# Patient Record
Sex: Male | Born: 1940
Health system: Southern US, Community
[De-identification: ages and names within clinical notes are randomized; demographics above are authoritative.]

## PROBLEM LIST (undated history)

## (undated) DIAGNOSIS — M899 Disorder of bone, unspecified: Secondary | ICD-10-CM

## (undated) DIAGNOSIS — I2699 Other pulmonary embolism without acute cor pulmonale: Secondary | ICD-10-CM

## (undated) DIAGNOSIS — I499 Cardiac arrhythmia, unspecified: Secondary | ICD-10-CM

## (undated) DIAGNOSIS — I4949 Other premature depolarization: Secondary | ICD-10-CM

## (undated) DIAGNOSIS — K769 Liver disease, unspecified: Secondary | ICD-10-CM

## (undated) DIAGNOSIS — N400 Enlarged prostate without lower urinary tract symptoms: Secondary | ICD-10-CM

## (undated) DIAGNOSIS — K635 Polyp of colon: Secondary | ICD-10-CM

## (undated) DIAGNOSIS — I10 Essential (primary) hypertension: Secondary | ICD-10-CM

## (undated) DIAGNOSIS — R911 Solitary pulmonary nodule: Secondary | ICD-10-CM

## (undated) DIAGNOSIS — J449 Chronic obstructive pulmonary disease, unspecified: Secondary | ICD-10-CM

## (undated) DIAGNOSIS — K219 Gastro-esophageal reflux disease without esophagitis: Secondary | ICD-10-CM

## (undated) DIAGNOSIS — J9610 Chronic respiratory failure, unspecified whether with hypoxia or hypercapnia: Secondary | ICD-10-CM

## (undated) DIAGNOSIS — E785 Hyperlipidemia, unspecified: Secondary | ICD-10-CM

## (undated) DIAGNOSIS — E7800 Pure hypercholesterolemia, unspecified: Secondary | ICD-10-CM

## (undated) DIAGNOSIS — D6859 Other primary thrombophilia: Secondary | ICD-10-CM

## (undated) DIAGNOSIS — G4700 Insomnia, unspecified: Secondary | ICD-10-CM

## (undated) DIAGNOSIS — Z7901 Long term (current) use of anticoagulants: Secondary | ICD-10-CM

## (undated) HISTORY — DX: Liver disease, unspecified: K76.9

## (undated) HISTORY — DX: Long term (current) use of anticoagulants: Z79.01

## (undated) HISTORY — DX: Other premature depolarization: I49.49

## (undated) HISTORY — DX: Solitary pulmonary nodule: R91.1

## (undated) HISTORY — DX: Other primary thrombophilia: D68.59

## (undated) HISTORY — DX: Chronic respiratory failure, unspecified whether with hypoxia or hypercapnia: J96.10

## (undated) HISTORY — DX: Hyperlipidemia, unspecified: E78.5

## (undated) HISTORY — DX: Insomnia, unspecified: G47.00

## (undated) HISTORY — DX: Gastro-esophageal reflux disease without esophagitis: K21.9

## (undated) HISTORY — PX: NO PAST SURGERIES: SHX2092

## (undated) HISTORY — DX: Benign prostatic hyperplasia without lower urinary tract symptoms: N40.0

## (undated) HISTORY — DX: Cardiac arrhythmia, unspecified: I49.9

## (undated) HISTORY — DX: Polyp of colon: K63.5

## (undated) HISTORY — DX: Pure hypercholesterolemia, unspecified: E78.00

## (undated) HISTORY — DX: Disorder of bone, unspecified: M89.9

---

## 2015-09-22 DIAGNOSIS — I10 Essential (primary) hypertension: Secondary | ICD-10-CM | POA: Diagnosis not present

## 2015-09-22 DIAGNOSIS — G4700 Insomnia, unspecified: Secondary | ICD-10-CM | POA: Diagnosis not present

## 2015-09-22 DIAGNOSIS — J439 Emphysema, unspecified: Secondary | ICD-10-CM | POA: Diagnosis not present

## 2015-09-22 DIAGNOSIS — E785 Hyperlipidemia, unspecified: Secondary | ICD-10-CM | POA: Diagnosis not present

## 2015-12-25 DIAGNOSIS — R911 Solitary pulmonary nodule: Secondary | ICD-10-CM | POA: Diagnosis not present

## 2015-12-25 DIAGNOSIS — Z0000 Encounter for general adult medical examination without abnormal findings: Secondary | ICD-10-CM | POA: Diagnosis not present

## 2015-12-25 DIAGNOSIS — E785 Hyperlipidemia, unspecified: Secondary | ICD-10-CM | POA: Diagnosis not present

## 2015-12-25 DIAGNOSIS — G4700 Insomnia, unspecified: Secondary | ICD-10-CM | POA: Diagnosis not present

## 2015-12-25 DIAGNOSIS — J439 Emphysema, unspecified: Secondary | ICD-10-CM | POA: Diagnosis not present

## 2015-12-25 DIAGNOSIS — I10 Essential (primary) hypertension: Secondary | ICD-10-CM | POA: Diagnosis not present

## 2015-12-25 DIAGNOSIS — Z79899 Other long term (current) drug therapy: Secondary | ICD-10-CM | POA: Diagnosis not present

## 2015-12-27 ENCOUNTER — Other Ambulatory Visit: Payer: Self-pay | Admitting: Family Medicine

## 2015-12-27 DIAGNOSIS — R911 Solitary pulmonary nodule: Secondary | ICD-10-CM

## 2016-01-02 ENCOUNTER — Ambulatory Visit
Admission: RE | Admit: 2016-01-02 | Discharge: 2016-01-02 | Disposition: A | Discharge status: Home / Self Care | Payer: Commercial Managed Care - HMO | Source: Ambulatory Visit | Attending: Family Medicine | Admitting: Family Medicine

## 2016-01-02 DIAGNOSIS — R911 Solitary pulmonary nodule: Secondary | ICD-10-CM

## 2016-01-02 DIAGNOSIS — R918 Other nonspecific abnormal finding of lung field: Secondary | ICD-10-CM | POA: Diagnosis not present

## 2016-06-24 DIAGNOSIS — Z79899 Other long term (current) drug therapy: Secondary | ICD-10-CM | POA: Diagnosis not present

## 2016-06-24 DIAGNOSIS — M899 Disorder of bone, unspecified: Secondary | ICD-10-CM | POA: Diagnosis not present

## 2016-06-24 DIAGNOSIS — J439 Emphysema, unspecified: Secondary | ICD-10-CM | POA: Diagnosis not present

## 2016-06-24 DIAGNOSIS — I10 Essential (primary) hypertension: Secondary | ICD-10-CM | POA: Diagnosis not present

## 2016-06-24 DIAGNOSIS — G4700 Insomnia, unspecified: Secondary | ICD-10-CM | POA: Diagnosis not present

## 2016-06-24 DIAGNOSIS — E7800 Pure hypercholesterolemia, unspecified: Secondary | ICD-10-CM | POA: Diagnosis not present

## 2016-07-03 DIAGNOSIS — I10 Essential (primary) hypertension: Secondary | ICD-10-CM | POA: Diagnosis not present

## 2016-07-03 DIAGNOSIS — Z6832 Body mass index (BMI) 32.0-32.9, adult: Secondary | ICD-10-CM | POA: Diagnosis not present

## 2016-07-03 DIAGNOSIS — M899 Disorder of bone, unspecified: Secondary | ICD-10-CM | POA: Diagnosis not present

## 2016-07-23 DIAGNOSIS — Z23 Encounter for immunization: Secondary | ICD-10-CM | POA: Diagnosis not present

## 2018-05-15 DIAGNOSIS — E7800 Pure hypercholesterolemia, unspecified: Secondary | ICD-10-CM | POA: Diagnosis not present

## 2018-05-15 DIAGNOSIS — I10 Essential (primary) hypertension: Secondary | ICD-10-CM | POA: Diagnosis not present

## 2018-05-15 DIAGNOSIS — J439 Emphysema, unspecified: Secondary | ICD-10-CM | POA: Diagnosis not present

## 2018-05-15 DIAGNOSIS — G4700 Insomnia, unspecified: Secondary | ICD-10-CM | POA: Diagnosis not present

## 2018-05-15 DIAGNOSIS — I2699 Other pulmonary embolism without acute cor pulmonale: Secondary | ICD-10-CM | POA: Diagnosis not present

## 2018-06-12 DIAGNOSIS — I10 Essential (primary) hypertension: Secondary | ICD-10-CM | POA: Diagnosis not present

## 2018-06-12 DIAGNOSIS — Z86711 Personal history of pulmonary embolism: Secondary | ICD-10-CM | POA: Diagnosis not present

## 2018-06-12 DIAGNOSIS — G4700 Insomnia, unspecified: Secondary | ICD-10-CM | POA: Diagnosis not present

## 2018-06-12 DIAGNOSIS — J439 Emphysema, unspecified: Secondary | ICD-10-CM | POA: Diagnosis not present

## 2018-07-10 DIAGNOSIS — Z23 Encounter for immunization: Secondary | ICD-10-CM | POA: Diagnosis not present

## 2018-12-15 DIAGNOSIS — R911 Solitary pulmonary nodule: Secondary | ICD-10-CM | POA: Diagnosis not present

## 2018-12-15 DIAGNOSIS — Z0001 Encounter for general adult medical examination with abnormal findings: Secondary | ICD-10-CM | POA: Diagnosis not present

## 2018-12-15 DIAGNOSIS — M899 Disorder of bone, unspecified: Secondary | ICD-10-CM | POA: Diagnosis not present

## 2018-12-15 DIAGNOSIS — I10 Essential (primary) hypertension: Secondary | ICD-10-CM | POA: Diagnosis not present

## 2018-12-15 DIAGNOSIS — Z79899 Other long term (current) drug therapy: Secondary | ICD-10-CM | POA: Diagnosis not present

## 2018-12-15 DIAGNOSIS — Z86711 Personal history of pulmonary embolism: Secondary | ICD-10-CM | POA: Diagnosis not present

## 2018-12-15 DIAGNOSIS — E7800 Pure hypercholesterolemia, unspecified: Secondary | ICD-10-CM | POA: Diagnosis not present

## 2018-12-15 DIAGNOSIS — M6530 Trigger finger, unspecified finger: Secondary | ICD-10-CM | POA: Diagnosis not present

## 2018-12-15 DIAGNOSIS — J439 Emphysema, unspecified: Secondary | ICD-10-CM | POA: Diagnosis not present

## 2018-12-17 ENCOUNTER — Other Ambulatory Visit: Payer: Self-pay | Admitting: Family Medicine

## 2018-12-17 DIAGNOSIS — Z136 Encounter for screening for cardiovascular disorders: Secondary | ICD-10-CM

## 2018-12-17 DIAGNOSIS — Z87891 Personal history of nicotine dependence: Secondary | ICD-10-CM

## 2018-12-23 ENCOUNTER — Other Ambulatory Visit: Payer: Self-pay | Admitting: Family Medicine

## 2018-12-23 DIAGNOSIS — R911 Solitary pulmonary nodule: Secondary | ICD-10-CM

## 2018-12-24 DIAGNOSIS — M19042 Primary osteoarthritis, left hand: Secondary | ICD-10-CM | POA: Diagnosis not present

## 2018-12-24 DIAGNOSIS — M19041 Primary osteoarthritis, right hand: Secondary | ICD-10-CM | POA: Diagnosis not present

## 2018-12-25 ENCOUNTER — Ambulatory Visit: Payer: Commercial Managed Care - HMO

## 2018-12-31 ENCOUNTER — Ambulatory Visit
Admission: RE | Admit: 2018-12-31 | Discharge: 2018-12-31 | Disposition: A | Discharge status: Home / Self Care | Payer: Medicare HMO | Source: Ambulatory Visit | Attending: Family Medicine | Admitting: Family Medicine

## 2018-12-31 DIAGNOSIS — Z136 Encounter for screening for cardiovascular disorders: Secondary | ICD-10-CM

## 2018-12-31 DIAGNOSIS — I700 Atherosclerosis of aorta: Secondary | ICD-10-CM | POA: Diagnosis not present

## 2018-12-31 DIAGNOSIS — Z87891 Personal history of nicotine dependence: Secondary | ICD-10-CM

## 2019-01-12 ENCOUNTER — Other Ambulatory Visit: Payer: Self-pay | Admitting: Gastroenterology

## 2019-01-12 DIAGNOSIS — K635 Polyp of colon: Secondary | ICD-10-CM

## 2019-01-21 DIAGNOSIS — M19042 Primary osteoarthritis, left hand: Secondary | ICD-10-CM | POA: Diagnosis not present

## 2019-01-21 DIAGNOSIS — M19041 Primary osteoarthritis, right hand: Secondary | ICD-10-CM | POA: Diagnosis not present

## 2019-01-29 ENCOUNTER — Inpatient Hospital Stay: Admission: RE | Admit: 2019-01-29 | Payer: Medicare HMO | Source: Ambulatory Visit

## 2019-04-08 ENCOUNTER — Ambulatory Visit
Admission: RE | Admit: 2019-04-08 | Discharge: 2019-04-08 | Disposition: A | Discharge status: Home / Self Care | Payer: Medicare HMO | Source: Ambulatory Visit | Attending: Family Medicine | Admitting: Family Medicine

## 2019-04-08 ENCOUNTER — Other Ambulatory Visit: Payer: Self-pay

## 2019-04-08 DIAGNOSIS — R911 Solitary pulmonary nodule: Secondary | ICD-10-CM

## 2019-04-08 DIAGNOSIS — R918 Other nonspecific abnormal finding of lung field: Secondary | ICD-10-CM | POA: Diagnosis not present

## 2019-04-12 ENCOUNTER — Emergency Department (HOSPITAL_COMMUNITY): Payer: Medicare HMO

## 2019-04-12 ENCOUNTER — Observation Stay (HOSPITAL_BASED_OUTPATIENT_CLINIC_OR_DEPARTMENT_OTHER): Payer: Medicare HMO

## 2019-04-12 ENCOUNTER — Observation Stay (HOSPITAL_BASED_OUTPATIENT_CLINIC_OR_DEPARTMENT_OTHER)
Admit: 2019-04-12 | Discharge: 2019-04-12 | Disposition: A | Discharge status: Home / Self Care | Payer: Medicare HMO | Attending: Emergency Medicine | Admitting: Emergency Medicine

## 2019-04-12 ENCOUNTER — Other Ambulatory Visit: Payer: Self-pay

## 2019-04-12 ENCOUNTER — Encounter (HOSPITAL_COMMUNITY): Payer: Self-pay | Admitting: Emergency Medicine

## 2019-04-12 ENCOUNTER — Inpatient Hospital Stay (HOSPITAL_COMMUNITY)
Admission: EM | Admit: 2019-04-12 | Discharge: 2019-04-14 | DRG: 175 | Disposition: A | Discharge status: Home / Self Care | Payer: Medicare HMO | Attending: Internal Medicine | Admitting: Internal Medicine

## 2019-04-12 DIAGNOSIS — F1010 Alcohol abuse, uncomplicated: Secondary | ICD-10-CM | POA: Diagnosis present

## 2019-04-12 DIAGNOSIS — Z823 Family history of stroke: Secondary | ICD-10-CM

## 2019-04-12 DIAGNOSIS — J449 Chronic obstructive pulmonary disease, unspecified: Secondary | ICD-10-CM | POA: Diagnosis not present

## 2019-04-12 DIAGNOSIS — I77810 Thoracic aortic ectasia: Secondary | ICD-10-CM | POA: Diagnosis present

## 2019-04-12 DIAGNOSIS — R911 Solitary pulmonary nodule: Secondary | ICD-10-CM | POA: Diagnosis present

## 2019-04-12 DIAGNOSIS — Z86711 Personal history of pulmonary embolism: Secondary | ICD-10-CM | POA: Diagnosis not present

## 2019-04-12 DIAGNOSIS — I10 Essential (primary) hypertension: Secondary | ICD-10-CM | POA: Diagnosis present

## 2019-04-12 DIAGNOSIS — Z20828 Contact with and (suspected) exposure to other viral communicable diseases: Secondary | ICD-10-CM | POA: Diagnosis not present

## 2019-04-12 DIAGNOSIS — E785 Hyperlipidemia, unspecified: Secondary | ICD-10-CM | POA: Diagnosis present

## 2019-04-12 DIAGNOSIS — I2694 Multiple subsegmental pulmonary emboli without acute cor pulmonale: Secondary | ICD-10-CM

## 2019-04-12 DIAGNOSIS — I2609 Other pulmonary embolism with acute cor pulmonale: Secondary | ICD-10-CM | POA: Diagnosis not present

## 2019-04-12 DIAGNOSIS — Z7982 Long term (current) use of aspirin: Secondary | ICD-10-CM

## 2019-04-12 DIAGNOSIS — Z7951 Long term (current) use of inhaled steroids: Secondary | ICD-10-CM | POA: Diagnosis not present

## 2019-04-12 DIAGNOSIS — R0602 Shortness of breath: Secondary | ICD-10-CM | POA: Diagnosis present

## 2019-04-12 DIAGNOSIS — Z79899 Other long term (current) drug therapy: Secondary | ICD-10-CM

## 2019-04-12 DIAGNOSIS — R7989 Other specified abnormal findings of blood chemistry: Secondary | ICD-10-CM | POA: Diagnosis not present

## 2019-04-12 DIAGNOSIS — J9621 Acute and chronic respiratory failure with hypoxia: Secondary | ICD-10-CM

## 2019-04-12 DIAGNOSIS — I493 Ventricular premature depolarization: Secondary | ICD-10-CM | POA: Diagnosis present

## 2019-04-12 DIAGNOSIS — I2699 Other pulmonary embolism without acute cor pulmonale: Secondary | ICD-10-CM | POA: Diagnosis not present

## 2019-04-12 DIAGNOSIS — J9601 Acute respiratory failure with hypoxia: Secondary | ICD-10-CM | POA: Diagnosis present

## 2019-04-12 DIAGNOSIS — Z87891 Personal history of nicotine dependence: Secondary | ICD-10-CM

## 2019-04-12 DIAGNOSIS — E876 Hypokalemia: Secondary | ICD-10-CM | POA: Diagnosis not present

## 2019-04-12 DIAGNOSIS — I2510 Atherosclerotic heart disease of native coronary artery without angina pectoris: Secondary | ICD-10-CM | POA: Diagnosis present

## 2019-04-12 DIAGNOSIS — I248 Other forms of acute ischemic heart disease: Secondary | ICD-10-CM | POA: Diagnosis not present

## 2019-04-12 DIAGNOSIS — Z720 Tobacco use: Secondary | ICD-10-CM

## 2019-04-12 DIAGNOSIS — R778 Other specified abnormalities of plasma proteins: Secondary | ICD-10-CM | POA: Diagnosis present

## 2019-04-12 HISTORY — DX: Other pulmonary embolism without acute cor pulmonale: I26.99

## 2019-04-12 HISTORY — DX: Chronic obstructive pulmonary disease, unspecified: J44.9

## 2019-04-12 HISTORY — DX: Essential (primary) hypertension: I10

## 2019-04-12 LAB — CBC WITH DIFFERENTIAL/PLATELET
Abs Immature Granulocytes: 0.04 10*3/uL (ref 0.00–0.07)
Basophils Absolute: 0.1 10*3/uL (ref 0.0–0.1)
Basophils Relative: 1 %
Eosinophils Absolute: 0.2 10*3/uL (ref 0.0–0.5)
Eosinophils Relative: 2 %
HCT: 46 % (ref 39.0–52.0)
Hemoglobin: 15.6 g/dL (ref 13.0–17.0)
Immature Granulocytes: 0 %
Lymphocytes Relative: 14 %
Lymphs Abs: 1.3 10*3/uL (ref 0.7–4.0)
MCH: 33.5 pg (ref 26.0–34.0)
MCHC: 33.9 g/dL (ref 30.0–36.0)
MCV: 98.7 fL (ref 80.0–100.0)
Monocytes Absolute: 0.7 10*3/uL (ref 0.1–1.0)
Monocytes Relative: 7 %
Neutro Abs: 6.9 10*3/uL (ref 1.7–7.7)
Neutrophils Relative %: 76 %
Platelets: 197 10*3/uL (ref 150–400)
RBC: 4.66 MIL/uL (ref 4.22–5.81)
RDW: 13.8 % (ref 11.5–15.5)
WBC: 9.1 10*3/uL (ref 4.0–10.5)
nRBC: 0 % (ref 0.0–0.2)

## 2019-04-12 LAB — BASIC METABOLIC PANEL
Anion gap: 17 — ABNORMAL HIGH (ref 5–15)
BUN: 17 mg/dL (ref 8–23)
CO2: 16 mmol/L — ABNORMAL LOW (ref 22–32)
Calcium: 9.2 mg/dL (ref 8.9–10.3)
Chloride: 103 mmol/L (ref 98–111)
Creatinine, Ser: 1.2 mg/dL (ref 0.61–1.24)
GFR calc Af Amer: >60 mL/min (ref >60)
GFR calc non Af Amer: 57 mL/min — ABNORMAL LOW (ref >60)
Glucose, Bld: 120 mg/dL — ABNORMAL HIGH (ref 70–99)
Potassium: 4.3 mmol/L (ref 3.5–5.1)
Sodium: 136 mmol/L (ref 135–145)

## 2019-04-12 LAB — HEPARIN LEVEL (UNFRACTIONATED): Heparin Unfractionated: 0.51 IU/mL (ref 0.30–0.70)

## 2019-04-12 LAB — BRAIN NATRIURETIC PEPTIDE: B Natriuretic Peptide: 326.6 pg/mL — ABNORMAL HIGH (ref 0.0–100.0)

## 2019-04-12 LAB — TROPONIN I
Troponin I: 0.05 ng/mL (ref <0.03)
Troponin I: 0.09 ng/mL (ref <0.03)

## 2019-04-12 LAB — ECHOCARDIOGRAM COMPLETE: Weight: 2720 oz

## 2019-04-12 LAB — TSH: TSH: 1.714 u[IU]/mL (ref 0.350–4.500)

## 2019-04-12 LAB — SARS CORONAVIRUS 2: SARS Coronavirus 2: NOT DETECTED

## 2019-04-12 LAB — SEDIMENTATION RATE: Sed Rate: 4 mm/hr (ref 0–16)

## 2019-04-12 MED ORDER — IOHEXOL 350 MG/ML SOLN
100.0000 mL | Freq: Once | INTRAVENOUS | Status: AC | PRN
  Administered 2019-04-12: 100 mL via INTRAVENOUS

## 2019-04-12 MED ORDER — TRAZODONE HCL 100 MG PO TABS
100.0000 mg | ORAL_TABLET | Freq: Every day | ORAL | Status: DC
  Administered 2019-04-12 – 2019-04-13 (×2): 100 mg via ORAL
  Filled 2019-04-12 (×2): qty 1

## 2019-04-12 MED ORDER — PRAVASTATIN SODIUM 40 MG PO TABS
80.0000 mg | ORAL_TABLET | Freq: Every day | ORAL | Status: DC
  Administered 2019-04-12 – 2019-04-14 (×3): 80 mg via ORAL
  Filled 2019-04-12 (×3): qty 2

## 2019-04-12 MED ORDER — THIAMINE HCL 100 MG/ML IJ SOLN
100.0000 mg | Freq: Every day | INTRAMUSCULAR | Status: DC
  Filled 2019-04-12: qty 2

## 2019-04-12 MED ORDER — LISINOPRIL 20 MG PO TABS
20.0000 mg | ORAL_TABLET | Freq: Every day | ORAL | Status: DC
  Administered 2019-04-13 – 2019-04-14 (×2): 20 mg via ORAL
  Filled 2019-04-12 (×2): qty 1

## 2019-04-12 MED ORDER — HEPARIN (PORCINE) 25000 UT/250ML-% IV SOLN
1250.0000 [IU]/h | INTRAVENOUS | Status: DC
  Administered 2019-04-12 – 2019-04-13 (×2): 1250 [IU]/h via INTRAVENOUS
  Filled 2019-04-12 (×2): qty 250

## 2019-04-12 MED ORDER — LORAZEPAM 2 MG/ML IJ SOLN: 0.0000 mg | Freq: Four times a day (QID) | INTRAMUSCULAR | Status: DC

## 2019-04-12 MED ORDER — ADULT MULTIVITAMIN W/MINERALS CH
1.0000 | ORAL_TABLET | Freq: Every day | ORAL | Status: DC
  Administered 2019-04-12 – 2019-04-14 (×3): 1 via ORAL
  Filled 2019-04-12 (×3): qty 1

## 2019-04-12 MED ORDER — ONDANSETRON HCL 4 MG/2ML IJ SOLN
4.0000 mg | Freq: Four times a day (QID) | INTRAMUSCULAR | Status: DC | PRN
  Administered 2019-04-14: 4 mg via INTRAVENOUS
  Filled 2019-04-12: qty 2

## 2019-04-12 MED ORDER — LORAZEPAM 2 MG/ML IJ SOLN: 1.0000 mg | Freq: Four times a day (QID) | INTRAMUSCULAR | Status: DC | PRN

## 2019-04-12 MED ORDER — LORAZEPAM 2 MG/ML IJ SOLN: 0.0000 mg | Freq: Two times a day (BID) | INTRAMUSCULAR | Status: DC

## 2019-04-12 MED ORDER — UMECLIDINIUM BROMIDE 62.5 MCG/INH IN AEPB
1.0000 | INHALATION_SPRAY | Freq: Every day | RESPIRATORY_TRACT | Status: DC
  Administered 2019-04-12 – 2019-04-14 (×3): 1 via RESPIRATORY_TRACT
  Filled 2019-04-12: qty 7

## 2019-04-12 MED ORDER — ALBUTEROL SULFATE (2.5 MG/3ML) 0.083% IN NEBU: 2.5000 mg | INHALATION_SOLUTION | Freq: Four times a day (QID) | RESPIRATORY_TRACT | Status: DC | PRN

## 2019-04-12 MED ORDER — LISINOPRIL-HYDROCHLOROTHIAZIDE 20-25 MG PO TABS: 1.0000 | ORAL_TABLET | Freq: Every day | ORAL | Status: DC

## 2019-04-12 MED ORDER — ACETAMINOPHEN 325 MG PO TABS: 650.0000 mg | ORAL_TABLET | Freq: Four times a day (QID) | ORAL | Status: DC | PRN

## 2019-04-12 MED ORDER — ACETAMINOPHEN 650 MG RE SUPP: 650.0000 mg | Freq: Four times a day (QID) | RECTAL | Status: DC | PRN

## 2019-04-12 MED ORDER — VITAMIN B-1 100 MG PO TABS
100.0000 mg | ORAL_TABLET | Freq: Every day | ORAL | Status: DC
  Administered 2019-04-12 – 2019-04-14 (×3): 100 mg via ORAL
  Filled 2019-04-12 (×3): qty 1

## 2019-04-12 MED ORDER — TIOTROPIUM BROMIDE MONOHYDRATE 18 MCG IN CAPS: 1.0000 | ORAL_CAPSULE | Freq: Every day | RESPIRATORY_TRACT | Status: DC

## 2019-04-12 MED ORDER — LORAZEPAM 1 MG PO TABS
1.0000 mg | ORAL_TABLET | Freq: Four times a day (QID) | ORAL | Status: DC | PRN
  Administered 2019-04-14: 1 mg via ORAL
  Filled 2019-04-12: qty 1

## 2019-04-12 MED ORDER — PERFLUTREN LIPID MICROSPHERE
1.0000 mL | INTRAVENOUS | Status: AC | PRN
  Administered 2019-04-12: 2 mL via INTRAVENOUS
  Filled 2019-04-12: qty 10

## 2019-04-12 MED ORDER — HEPARIN BOLUS VIA INFUSION
4500.0000 [IU] | Freq: Once | INTRAVENOUS | Status: AC
  Administered 2019-04-12: 4500 [IU] via INTRAVENOUS
  Filled 2019-04-12: qty 4500

## 2019-04-12 MED ORDER — HYDROCHLOROTHIAZIDE 25 MG PO TABS
25.0000 mg | ORAL_TABLET | Freq: Every day | ORAL | Status: DC
  Administered 2019-04-13 – 2019-04-14 (×2): 25 mg via ORAL
  Filled 2019-04-12 (×2): qty 1

## 2019-04-12 MED ORDER — FOLIC ACID 1 MG PO TABS
1.0000 mg | ORAL_TABLET | Freq: Every day | ORAL | Status: DC
  Administered 2019-04-12 – 2019-04-14 (×3): 1 mg via ORAL
  Filled 2019-04-12 (×3): qty 1

## 2019-04-12 MED ORDER — ONDANSETRON HCL 4 MG PO TABS: 4.0000 mg | ORAL_TABLET | Freq: Four times a day (QID) | ORAL | Status: DC | PRN

## 2019-04-12 MED ORDER — ASPIRIN 81 MG PO CHEW
324.0000 mg | CHEWABLE_TABLET | Freq: Once | ORAL | Status: AC
  Administered 2019-04-12: 324 mg via ORAL
  Filled 2019-04-12: qty 4

## 2019-04-12 NOTE — Plan of Care (Signed)
  Problem: Pain Managment: Goal: General experience of comfort will improve Outcome: Progressing   Problem: Safety: Goal: Ability to remain free from injury will improve Outcome: Progressing   Problem: Skin Integrity: Goal: Risk for impaired skin integrity will decrease Outcome: Progressing   

## 2019-04-12 NOTE — ED Provider Notes (Signed)
[facility] EMERGENCY DEPARTMENT Provider Note   CSN: [id number] Arrival date & time: 04/12/19  7035    History   Chief Complaint Chief Complaint  Patient presents with  . Shortness of Breath    HPI Jason [name] is a 78 y.o. male.     HPI  78 year old male presents with acute dyspnea.  Started yesterday after he got home from his family's house.  It is most prominent on exertion.  Some dyspnea at rest.  There is no chest pain, fever, cough.  He does not normally use oxygen.  He states he had a PE last year and in August was taken off of his Xarelto.  No leg pain or swelling.  Unclear where the PE came from originally.  Past Medical History:  Diagnosis Date  . COPD (chronic obstructive pulmonary disease) ([place])   . Hypertension   . Pulmonary embolism [facility])     Patient Active Problem List   Diagnosis Date Noted  . Pulmonary emboli ([place]) 04/12/2019    History reviewed. No pertinent surgical history.      Home Medications    Prior to Admission medications   Medication Sig Start Date End Date Taking? Authorizing Provider  aspirin 325 MG EC tablet Take 325 mg by mouth daily.   Yes [provider]  lisinopril-hydrochlorothiazide (ZESTORETIC) 20-25 MG tablet Take 1 tablet by mouth daily. 04/08/19  Yes [provider]  Multiple Vitamins-Minerals (MULTIVITAMIN WITH MINERALS) tablet Take 1 tablet by mouth daily.   Yes [provider]  Nutritional Supplements (JOINT FORMULA PO) Take 1 tablet by mouth daily.   Yes [provider]  pravastatin (PRAVACHOL) 80 MG tablet Take 80 mg by mouth daily. 04/07/19  Yes [provider]  SPIRIVA HANDIHALER 18 MCG inhalation capsule Place 1 capsule into inhaler and inhale daily. 12/15/18  Yes [provider]  traZODone (DESYREL) 100 MG tablet Take 100 mg by mouth at bedtime. 03/24/19  Yes [provider]  Vitamin D, Cholecalciferol, 50 MCG (2000 UT) CAPS Take 2,000 Units by  mouth daily.   Yes [provider]    Family History Family History  Problem Relation Age of Onset  . CVA Mother   . Deep vein thrombosis Mother   . CVA Father     Social History Social History   Tobacco Use  . Smoking status: Former Research scientist (life sciences)  . Smokeless tobacco: Never Used  Substance Use Topics  . Alcohol use: Not Currently  . Drug use: Never     Allergies   Patient has no allergy information on record.   Review of Systems Review of Systems  Constitutional: Negative for fever.  Respiratory: Positive for shortness of breath. Negative for cough.   Cardiovascular: Negative for chest pain and leg swelling.  All other systems reviewed and are negative.    Physical Exam Updated Vital Signs BP (!) 126/110   Pulse 91   Temp 98.2 F (36.8 C) (Oral)   Resp (!) 28   Wt 77.1 kg   SpO2 90%   Physical Exam Vitals signs and nursing note reviewed.  Constitutional:      General: He is not in acute distress.    Appearance: He is well-developed. He is not ill-appearing or diaphoretic.  HENT:     Head: Normocephalic and atraumatic.     Right Ear: External ear normal.     Left Ear: External ear normal.     Nose: Nose normal.  Eyes:  General:        Right eye: No discharge.        Left eye: No discharge.  Neck:     Musculoskeletal: Neck supple.  Cardiovascular:     Rate and Rhythm: Normal rate and regular rhythm.     Heart sounds: Normal heart sounds.  Pulmonary:     Effort: Pulmonary effort is normal. Tachypnea (mild) present. No accessory muscle usage.     Breath sounds: Normal breath sounds. No wheezing, rhonchi or rales.  Abdominal:     Palpations: Abdomen is soft.     Tenderness: There is no abdominal tenderness.  Musculoskeletal:     Right lower leg: No edema.     Left lower leg: No edema.  Skin:    General: Skin is warm and dry.  Neurological:     Mental Status: He is alert.  Psychiatric:        Mood and Affect: Mood is not anxious.       ED Treatments / Results  Labs (all labs ordered are listed, but only abnormal results are displayed) Labs Reviewed  BASIC METABOLIC PANEL - Abnormal; Notable for the following components:      Result Value   CO2 16 (*)    Glucose, Bld 120 (*)    GFR calc non Af Amer 57 (*)    Anion gap 17 (*)    All other components within normal limits  BRAIN NATRIURETIC PEPTIDE - Abnormal; Notable for the following components:   B Natriuretic Peptide 326.6 (*)    All other components within normal limits  TROPONIN I - Abnormal; Notable for the following components:   Troponin I 0.05 (*)    All other components within normal limits  SARS CORONAVIRUS 2  CBC WITH DIFFERENTIAL/PLATELET  HEPARIN LEVEL (UNFRACTIONATED)  SEDIMENTATION RATE  TSH    EKG EKG Interpretation  Date/Time:  Monday April 12 2019 08:58:39 EDT Ventricular Rate:  98 PR Interval:    QRS Duration: 98 QT Interval:  357 QTC Calculation: 456 R Axis:   119 Text Interpretation:  Sinus rhythm Right axis deviation Abnormal T, consider ischemia, anterior leads No old tracing to compare Confirmed by Sherwood Gambler [phone]) on 04/12/2019 9:06:47 AM   Radiology Ct Angio Chest Pe W And/or Wo Contrast  Result Date: 04/12/2019 CLINICAL DATA:  Shortness of breath. History of pulmonary emboli. Patient taken off Eliquis a few months ago. EXAM: CT ANGIOGRAPHY CHEST WITH CONTRAST TECHNIQUE: Multidetector CT imaging of the chest was performed using the standard protocol during bolus administration of intravenous contrast. Multiplanar CT image reconstructions and MIPs were obtained to evaluate the vascular anatomy. CONTRAST:  100 mL of Omnipaque 350 COMPARISON:  Chest x-ray April 12, 2019. Chest CT April 08, 2019. Chest CT January 02, 2016. FINDINGS: Cardiovascular: Pulmonary emboli are identified involving all lobes. A large embolus burden is identified, involving multiple lobar and segmental branches. No emboli in the main pulmonary arteries. The main  pulmonary artery measures 3.7 cm, not significantly changed given difference in slice selection. Atherosclerotic changes are seen in the nonaneurysmal aorta. No dissection. Coronary artery calcifications are identified. The heart is unchanged. Mediastinum/Nodes: A small right pleural effusion is stable since the most recent comparison but new since 2017. No left-sided pleural effusion. No pericardial effusion. No adenopathy. Lungs/Pleura: Central airways are normal. No pneumothorax. Again noted is a lingular nodule as seen on series 6, image 80. Vessels run through region of this nodule, better appreciated today given contrast. The maximum  size of this nodule is 1.5 cm today versus 1.7 cm April 08, 2019 versus 1.3 cm in February of 2017. At least some of the apparent difference in size compared to 2017 could be due to 5 mm thick slices in 5284 versus 2 mm thick slices today. The difference in measurements since April 08, 2019 could be at least partially due to difference in slice selection and the presence of contrast today. The 3 mm nodule in the left upper lobe on series 6, image 41 is stable. A calcified granuloma is seen in the left base. Other small nodules are stable. Emphysematous changes are again seen in the lungs. A right-sided pleural effusion with adjacent atelectasis is stable since April 08, 2019 but new since January 02, 2016. No suspicious infiltrates. No other changes in the lungs. Upper Abdomen: Heterogeneous attenuation again seen in the liver, particularly the right hepatic lobe. It would be difficult to exclude an underlying lesion. No other abnormalities in the upper abdomen. Musculoskeletal: Multiple sclerotic lesions in the bones including the left side of the manubrium, multiple ribs,, and vertebral bodies are stable. A compression fracture of a midthoracic vertebral body is stable. Review of the MIP images confirms the above findings. IMPRESSION: 1. Moderate to severe pulmonary embolus burden as  above involving all lobes. The left atrium measures 6 cm in greatest dimension while the left ventricle measures 3.3 cm in greatest dimension. Positive for acute PE with CT evidence of right heart strain (RV/LV Ratio = 1.8) consistent with at least submassive (intermediate risk) PE. The presence of right heart strain has been associated with an increased risk of morbidity and mortality. Please activate Code PE by paging [phone]. 2. The lingular nodule measures 1.5 cm today versus 1.7 cm on the April 08, 2019 study and 1.3 cm in February of 2017. Comparison between the studies is somewhat difficult given contrast today compared to April 08, 2019 which was an unenhanced study and significant difference in slice thickness in selection since February 2017. Recommend a PET-CT for further evaluation. There are pulmonary arterial branches coursing through the nodule. Malignancy not excluded on this study. 3. Sclerotic lesions in the manubrium, ribs, and vertebral bodies are unchanged since 2017 and suspicious for sclerotic metastases, possibly treated. Recommend clinical correlation. 4. Heterogeneous attenuation in the liver, particularly the right hepatic lobe, is stable. It would be difficult to exclude underlying lesion on this study. Dedicated imaging of the liver with an enhanced CT scan or an MRI could better evaluate. 5. Stable compression fracture in a midthoracic vertebral body. 6. Other small pulmonary nodules are stable. 7. The main pulmonary artery measures 3.7 cm raising the possibility of pulmonary arterial hypertension. Findings called to Dr. Regenia Skeeter by myself. Electronically Signed   By: Dorise Bullion III M.D   On: 04/12/2019 12:25   Dg Chest Portable 1 View  Result Date: 04/12/2019 CLINICAL DATA:  Shortness of breath EXAM: PORTABLE CHEST 1 VIEW COMPARISON:  04/08/2019 FINDINGS: Cardiac shadows within normal limits. Aortic calcifications are noted. The lungs are well aerated bilaterally without focal  infiltrate or effusion. The known lingular nodule is again seen but less conspicuous than on prior CT examination. No acute bony abnormality is noted. Old rib fractures are seen on the right. IMPRESSION: No acute abnormality noted. The known lingular nodule is not as well appreciated as on recent CT. Electronically Signed   By: Inez Catalina M.D.   On: 04/12/2019 09:27   Vas [place] Lower Extremity Venous (dvt) (mc  And Wl 7a-7p)  Result Date: 04/12/2019  Lower Venous Study Indications: Pulmonary embolism.  Performing Technologist: Oliver Hum RVT  Examination Guidelines: A complete evaluation includes B-mode imaging, spectral Doppler, color Doppler, and power Doppler as needed of all accessible portions of each vessel. Bilateral testing is considered an integral part of a complete examination. Limited examinations for reoccurring indications may be performed as noted.  +---------+---------------+---------+-----------+----------+-------+ RIGHT    CompressibilityPhasicitySpontaneityPropertiesSummary +---------+---------------+---------+-----------+----------+-------+ CFV      Full           Yes      Yes                          +---------+---------------+---------+-----------+----------+-------+ SFJ      Full                                                 +---------+---------------+---------+-----------+----------+-------+ FV Prox  Full                                                 +---------+---------------+---------+-----------+----------+-------+ FV Mid   Full                                                 +---------+---------------+---------+-----------+----------+-------+ FV DistalFull                                                 +---------+---------------+---------+-----------+----------+-------+ PFV      Full                                                 +---------+---------------+---------+-----------+----------+-------+ POP      Full           Yes       Yes                          +---------+---------------+---------+-----------+----------+-------+ PTV      Full                                                 +---------+---------------+---------+-----------+----------+-------+ PERO     Full                                                 +---------+---------------+---------+-----------+----------+-------+   +---------+---------------+---------+-----------+----------+-------+ LEFT     CompressibilityPhasicitySpontaneityPropertiesSummary +---------+---------------+---------+-----------+----------+-------+ CFV      Full           Yes      Yes                          +---------+---------------+---------+-----------+----------+-------+  SFJ      Full                                                 +---------+---------------+---------+-----------+----------+-------+ FV Prox  Full                                                 +---------+---------------+---------+-----------+----------+-------+ FV Mid   Full                                                 +---------+---------------+---------+-----------+----------+-------+ FV DistalFull                                                 +---------+---------------+---------+-----------+----------+-------+ PFV      Full                                                 +---------+---------------+---------+-----------+----------+-------+ POP      Full           Yes      Yes                          +---------+---------------+---------+-----------+----------+-------+ PTV      Full                                                 +---------+---------------+---------+-----------+----------+-------+ PERO     Full                                                 +---------+---------------+---------+-----------+----------+-------+     Summary: Right: There is no evidence of deep vein thrombosis in the lower extremity. No cystic structure found  in the popliteal fossa. Left: There is no evidence of deep vein thrombosis in the lower extremity. No cystic structure found in the popliteal fossa.  *See table(s) above for measurements and observations.    Preliminary     Procedures .Critical Care Performed by: Sherwood Gambler, MD Authorized by: Sherwood Gambler, MD   Critical care provider statement:    Critical care time (minutes):  35   Critical care time was exclusive of:  Separately billable procedures and treating other patients   Critical care was necessary to treat or prevent imminent or life-threatening deterioration of the following conditions:  Respiratory failure and circulatory failure   Critical care was time spent personally by me on the following activities:  Discussions with consultants, evaluation of patient's response to treatment, examination of patient, ordering and performing treatments and interventions, ordering and review of  laboratory studies, ordering and review of radiographic studies, pulse oximetry, re-evaluation of patient's condition, obtaining history from patient or surrogate and review of old charts   (including critical care time)  Medications Ordered in ED Medications  heparin ADULT infusion 100 units/mL (25000 units/224mL sodium chloride 0.45%) (1,250 Units/hr Intravenous New Bag/Given 04/12/19 1242)  lisinopril-hydrochlorothiazide (ZESTORETIC) 20-25 MG per tablet 1 tablet (has no administration in time range)  pravastatin (PRAVACHOL) tablet 80 mg (has no administration in time range)  traZODone (DESYREL) tablet 100 mg (has no administration in time range)  tiotropium (SPIRIVA) inhalation capsule ([facility] use ONLY) 18 mcg (has no administration in time range)  ondansetron (ZOFRAN) tablet 4 mg (has no administration in time range)    Or  ondansetron (ZOFRAN) injection 4 mg (has no administration in time range)  acetaminophen (TYLENOL) tablet 650 mg (has no administration in time range)    Or  acetaminophen  (TYLENOL) suppository 650 mg (has no administration in time range)  albuterol (PROVENTIL) (2.5 MG/3ML) 0.083% nebulizer solution 2.5 mg (has no administration in time range)  LORazepam (ATIVAN) tablet 1 mg (has no administration in time range)    Or  LORazepam (ATIVAN) injection 1 mg (has no administration in time range)  thiamine (VITAMIN B-1) tablet 100 mg (has no administration in time range)    Or  thiamine (B-1) injection 100 mg (has no administration in time range)  folic acid (FOLVITE) tablet 1 mg (has no administration in time range)  multivitamin with minerals tablet 1 tablet (has no administration in time range)  LORazepam (ATIVAN) injection 0-4 mg (has no administration in time range)    Followed by  LORazepam (ATIVAN) injection 0-4 mg (has no administration in time range)  aspirin chewable tablet 324 mg (324 mg Oral Given 04/12/19 1104)  iohexol (OMNIPAQUE) 350 MG/ML injection 100 mL (100 mLs Intravenous Contrast Given 04/12/19 1143)  heparin bolus via infusion 4,500 Units (4,500 Units Intravenous Bolus from Bag 04/12/19 1245)     Initial Impression / Assessment and Plan / ED Course  I have reviewed the triage vital signs and the nursing notes.  Pertinent labs & imaging results that were available during my care of the patient were reviewed by me and considered in my medical decision making (see chart for details).        CT scan confirms acute pulmonary embolism.  There is no saddle embolus but there are multiple emboli in both lungs.  This is causing right heart strain.  Code PE called and I discussed with Dr. Valeta Harms who will consult.  Patient otherwise is hemodynamically stable save for mild hypoxemia.  Will be admitted to the hospitalist service.  Started on IV heparin.  Final Clinical Impressions(s) / ED Diagnoses   Final diagnoses:  Acute pulmonary embolism with acute cor pulmonale, unspecified pulmonary embolism type ([place])  Acute respiratory failure with hypoxia [facility])     ED Discharge Orders    None       Sherwood Gambler, MD 04/12/19 1416

## 2019-04-12 NOTE — H&P (Addendum)
History and Physical    Pleas Carneal FUX:[id number] DOB: 10-16-1941 DOA: 04/12/2019  Referring MD/NP/PA: Sherwood Gambler, MD PCP: Patient, No Pcp Per  Patient coming from: home  Chief Complaint: Shortness of breath  I have personally briefly reviewed patient's old medical records in [place]   HPI: Jason [name] is a 78 y.o. male with medical history significant of COPD, pulmonary nodule, and PE no on anticoagulation; who presents with complaints of progressively worsening shortness of breath since last night.  Patient reports that he moved from [place] in May or June of last year.  He had previous history of pulmonary embolus and had been on Xarelto up until August when his primary care provider took him off of that medication.  To his knowledge there was not anything specific that put him at risk.  He denies having any recent travel/prolonged immobilization, leg swelling, calf pain, palpitations, or chest pain.  Denies any known history of malignancy.  Has a significant history of tobacco abuse, but reports not having smoked in the last 2 months.  He has also been previously worked up for a pulmonary nodule that he reports is been present for 10 to 12 years.  He just had a repeat CT scan last week of his chest that showed pulmonary nodule to be increased in size.  His primary care provider was supposed to be setting him up with a pulmonologist.  Patient denies having any history of irregular heartbeat.  He reports that his mother had a history of blood clots and in the age of 74 after having a stroke.  Lastly patient reports that he drinks quite a bit, but does not formally quantify the amount.  ED Course: On admission to the emergency department patient was seen to be afebrile, pulse 84-1 08, respiration 14-24, and O2 saturations  89 on RA improved to 94% on 2 L nasal cannula.  Significant for CO2 16, anion gap 17, BNP 326.6, and troponin 0.05.  CT angiogram of the chest revealed moderate to  severe pulmonary embolus burden involving all lobes with signs of right heart strain.  COVID-19 not detected.  Patient was given 325 mg of aspirin and started on heparin drip per pharmacy.  PCCM was notified, but did not recommend any need for any acute intervention.   Review of Systems  Constitutional: Negative for chills and fever.  HENT: Positive for hearing loss. Negative for congestion.   Eyes: Negative for photophobia and pain.  Respiratory: Positive for shortness of breath. Negative for cough.   Cardiovascular: Negative for chest pain and leg swelling.  Gastrointestinal: Negative for abdominal pain, diarrhea, nausea and vomiting.  Genitourinary: Negative for dysuria and hematuria.  Musculoskeletal: Negative for falls and myalgias.  Skin: Negative for rash.  Neurological: Negative for focal weakness and loss of consciousness.  Psychiatric/Behavioral: Positive for substance abuse. Negative for memory loss.    Past Medical History:  Diagnosis Date  . COPD (chronic obstructive pulmonary disease) ([place])   . Pulmonary embolism ([place])     History reviewed. No pertinent surgical history.   reports that he has quit smoking. He has never used smokeless tobacco. He reports previous alcohol use. He reports that he does not use drugs.   Family History  Problem Relation Age of Onset  . CVA Mother   . Deep vein thrombosis Mother   . CVA Father     Prior to Admission medications   Medication Sig Start Date End Date Taking? Authorizing Provider  aspirin 325  MG EC tablet Take 325 mg by mouth daily.   Yes [provider]  lisinopril-hydrochlorothiazide (ZESTORETIC) 20-25 MG tablet Take 1 tablet by mouth daily. 04/08/19  Yes [provider]  Multiple Vitamins-Minerals (MULTIVITAMIN WITH MINERALS) tablet Take 1 tablet by mouth daily.   Yes [provider]  Nutritional Supplements (JOINT FORMULA PO) Take 1 tablet by mouth daily.   Yes [provider]   pravastatin (PRAVACHOL) 80 MG tablet Take 80 mg by mouth daily. 04/07/19  Yes [provider]  SPIRIVA HANDIHALER 18 MCG inhalation capsule Place 1 capsule into inhaler and inhale daily. 12/15/18  Yes [provider]  traZODone (DESYREL) 100 MG tablet Take 100 mg by mouth at bedtime. 03/24/19  Yes [provider]  Vitamin D, Cholecalciferol, 50 MCG (2000 UT) CAPS Take 2,000 Units by mouth daily.   Yes [provider]    Physical Exam:  Constitutional: Elderly male in NAD, calm, comfortable Vitals:   04/12/19 1015 04/12/19 1030 04/12/19 1045 04/12/19 1100  BP: 114/67 131/82 (!) 143/64 121/66  Pulse: 85 95 99 91  Resp: '20 15 20 '$ (!) 23  Temp:      TempSrc:      SpO2: 93% 90% (!) 89% 90%  Weight:       Eyes: PERRL, lids and conjunctivae normal ENMT: Mucous membranes are moist. Posterior pharynx clear of any exudate or lesions.hard of hearing even with hearing aids in place. Neck: normal, supple, no masses, no thyromegaly Respiratory: clear to auscultation bilaterally, no wheezing, no crackles. Normal respiratory effort. No accessory muscle use.  Cardiovascular: Regular rate and rhythm, no murmurs / rubs / gallops. No extremity edema. 2+ pedal pulses. No carotid bruits.  Abdomen: no tenderness, no masses palpated. No hepatosplenomegaly. Bowel sounds positive.  Musculoskeletal: no clubbing / cyanosis. No joint deformity upper and lower extremities. Good ROM, no contractures. Normal muscle tone.  Skin: no rashes, lesions, ulcers. No induration Neurologic: CN 2-12 grossly intact. Sensation intact, DTR normal. Strength 5/5 in all 4.  Psychiatric: Normal judgment and insight. Alert and oriented x 3. Normal mood.     Labs on Admission: I have personally reviewed following labs and imaging studies  CBC: Recent Labs  Lab 04/12/19 0915  WBC 9.1  NEUTROABS 6.9  HGB 15.6  HCT 46.0  MCV 98.7  PLT 295   Basic Metabolic Panel: Recent Labs  Lab 04/12/19  0915  NA 136  K 4.3  CL 103  CO2 16*  GLUCOSE 120*  BUN 17  CREATININE 1.20  CALCIUM 9.2   GFR: CrCl cannot be calculated (Unknown ideal weight.). Liver Function Tests: No results for input(s): AST, ALT, ALKPHOS, BILITOT, PROT, ALBUMIN in the last 168 hours. No results for input(s): LIPASE, AMYLASE in the last 168 hours. No results for input(s): AMMONIA in the last 168 hours. Coagulation Profile: No results for input(s): INR, PROTIME in the last 168 hours. Cardiac Enzymes: Recent Labs  Lab 04/12/19 0915  TROPONINI 0.05*   BNP (last 3 results) No results for input(s): PROBNP in the last 8760 hours. HbA1C: No results for input(s): HGBA1C in the last 72 hours. CBG: No results for input(s): GLUCAP in the last 168 hours. Lipid Profile: No results for input(s): CHOL, HDL, LDLCALC, TRIG, CHOLHDL, LDLDIRECT in the last 72 hours. Thyroid Function Tests: No results for input(s): TSH, T4TOTAL, FREET4, T3FREE, THYROIDAB in the last 72 hours. Anemia Panel: No results for input(s): VITAMINB12, FOLATE, FERRITIN, TIBC, IRON, RETICCTPCT in the last 72 hours. Urine  analysis: No results found for: COLORURINE, APPEARANCEUR, [place], [place], GLUCOSEU, HGBUR, BILIRUBINUR, [place], PROTEINUR, UROBILINOGEN, NITRITE, LEUKOCYTESUR Sepsis Labs: Recent Results (from the past 240 hour(s))  SARS Coronavirus 2     Status: None   Collection Time: 04/12/19  9:25 AM  Result Value Ref Range Status   SARS Coronavirus 2 NOT DETECTED NOT DETECTED Final    Comment: (NOTE) SARS-CoV-2 target nucleic acids are NOT DETECTED. The SARS-CoV-2 RNA is generally detectable in upper and lower respiratory specimens during the acute phase of infection.  Negative  results do not preclude SARS-CoV-2 infection, do not rule out co-infections with other pathogens, and should not be used as the sole basis for treatment or other patient management decisions.  Negative results must be combined with clinical observations,  patient history, and epidemiological information. The expected result is Not Detected. Fact Sheet for Patients: http://www.biofiredefense.com/wp-content/uploads/2020/03/BIOFIRE-COVID -19-patients.pdf Fact Sheet for Healthcare Providers: http://www.biofiredefense.com/wp-content/uploads/2020/03/BIOFIRE-COVID -19-hcp.pdf This test is not yet approved or cleared by the [place] and  has been authorized for detection and/or diagnosis of SARS-CoV-2 by FDA under an Emergency Use Authorization (EUA).  This EUA will remain in effec t (meaning this test can be used) for the duration of  the COVID-19 declaration under Section 564(b)(1) of the Act, 21 U.S.C. section 360bbb-3(b)(1), unless the authorization is terminated or revoked sooner. Performed at [facility], [address] [place], [place] [address]      Radiological Exams on Admission: Ct Angio Chest Pe W And/or Wo Contrast  Result Date: 04/12/2019 CLINICAL DATA:  Shortness of breath. History of pulmonary emboli. Patient taken off Eliquis a few months ago. EXAM: CT ANGIOGRAPHY CHEST WITH CONTRAST TECHNIQUE: Multidetector CT imaging of the chest was performed using the standard protocol during bolus administration of intravenous contrast. Multiplanar CT image reconstructions and MIPs were obtained to evaluate the vascular anatomy. CONTRAST:  100 mL of Omnipaque 350 COMPARISON:  Chest x-ray April 12, 2019. Chest CT April 08, 2019. Chest CT January 02, 2016. FINDINGS: Cardiovascular: Pulmonary emboli are identified involving all lobes. A large embolus burden is identified, involving multiple lobar and segmental branches. No emboli in the main pulmonary arteries. The main pulmonary artery measures 3.7 cm, not significantly changed given difference in slice selection. Atherosclerotic changes are seen in the nonaneurysmal aorta. No dissection. Coronary artery calcifications are identified. The heart is unchanged. Mediastinum/Nodes: A small  right pleural effusion is stable since the most recent comparison but new since 2017. No left-sided pleural effusion. No pericardial effusion. No adenopathy. Lungs/Pleura: Central airways are normal. No pneumothorax. Again noted is a lingular nodule as seen on series 6, image 80. Vessels run through region of this nodule, better appreciated today given contrast. The maximum size of this nodule is 1.5 cm today versus 1.7 cm April 08, 2019 versus 1.3 cm in February of 2017. At least some of the apparent difference in size compared to 2017 could be due to 5 mm thick slices in 9833 versus 2 mm thick slices today. The difference in measurements since April 08, 2019 could be at least partially due to difference in slice selection and the presence of contrast today. The 3 mm nodule in the left upper lobe on series 6, image 41 is stable. A calcified granuloma is seen in the left base. Other small nodules are stable. Emphysematous changes are again seen in the lungs. A right-sided pleural effusion with adjacent atelectasis is stable since April 08, 2019 but new since January 02, 2016. No suspicious infiltrates.  No other changes in the lungs. Upper Abdomen: Heterogeneous attenuation again seen in the liver, particularly the right hepatic lobe. It would be difficult to exclude an underlying lesion. No other abnormalities in the upper abdomen. Musculoskeletal: Multiple sclerotic lesions in the bones including the left side of the manubrium, multiple ribs,, and vertebral bodies are stable. A compression fracture of a midthoracic vertebral body is stable. Review of the MIP images confirms the above findings. IMPRESSION: 1. Moderate to severe pulmonary embolus burden as above involving all lobes. The left atrium measures 6 cm in greatest dimension while the left ventricle measures 3.3 cm in greatest dimension. Positive for acute PE with CT evidence of right heart strain (RV/LV Ratio = 1.8) consistent with at least submassive  (intermediate risk) PE. The presence of right heart strain has been associated with an increased risk of morbidity and mortality. Please activate Code PE by paging [phone]. 2. The lingular nodule measures 1.5 cm today versus 1.7 cm on the April 08, 2019 study and 1.3 cm in February of 2017. Comparison between the studies is somewhat difficult given contrast today compared to April 08, 2019 which was an unenhanced study and significant difference in slice thickness in selection since February 2017. Recommend a PET-CT for further evaluation. There are pulmonary arterial branches coursing through the nodule. Malignancy not excluded on this study. 3. Sclerotic lesions in the manubrium, ribs, and vertebral bodies are unchanged since 2017 and suspicious for sclerotic metastases, possibly treated. Recommend clinical correlation. 4. Heterogeneous attenuation in the liver, particularly the right hepatic lobe, is stable. It would be difficult to exclude underlying lesion on this study. Dedicated imaging of the liver with an enhanced CT scan or an MRI could better evaluate. 5. Stable compression fracture in a midthoracic vertebral body. 6. Other small pulmonary nodules are stable. 7. The main pulmonary artery measures 3.7 cm raising the possibility of pulmonary arterial hypertension. Findings called to Dr. Regenia Skeeter by myself. Electronically Signed   By: Dorise Bullion III M.D   On: 04/12/2019 12:25   Dg Chest Portable 1 View  Result Date: 04/12/2019 CLINICAL DATA:  Shortness of breath EXAM: PORTABLE CHEST 1 VIEW COMPARISON:  04/08/2019 FINDINGS: Cardiac shadows within normal limits. Aortic calcifications are noted. The lungs are well aerated bilaterally without focal infiltrate or effusion. The known lingular nodule is again seen but less conspicuous than on prior CT examination. No acute bony abnormality is noted. Old rib fractures are seen on the right. IMPRESSION: No acute abnormality noted. The known lingular nodule  is not as well appreciated as on recent CT. Electronically Signed   By: Inez Catalina M.D.   On: 04/12/2019 09:27    EKG: Independently reviewed.  Sinus rhythm at 98 bpm and T wave abnormalities noted in anterior leads   Assessment/Plan Pulmonary embolus, prior history of pulmonary embolus: Patient presents with complaints of shortness of breath.  Previous history of PE, but no longer on medications of Xarelto since August 2019.  CT imaging showing extensive pulmonary emboli in all parts of the lung with signs of right heart strain.  Risk factors include patient reports mother with a history of blood clots in her legs as well.  This point as this is the second warning illness patient likely needs lifelong anticoagulation. -Admit to a telemetry bed -Continuous pulse oximetry with nasal cannula oxygen as needed -Add on TSH -Continue Heparin drip per pharmacy -Check Doppler ultrasound of the bilateral lower extremities and echocardiogram -Follow-up telemetry overnight -Consider ambulatory referral  to hematology given patient's history and his mother also having history of blood clots -Consider restarting patient on Xarelto in a.m.  COPD, without acute exacerbation: Patient presented with complaints of shortness of breath.  No signs of wheezing noted on physical exam.  Suspect secondary to above. -Albuterol nebs as needed -Continue Spiriva  Elevated troponin: Acute. Troponin elevated 0.05 from admission.  Suspect secondary to acute demand given pulmonary emboli. -Continue to trend troponin  Pulmonary nodule, sclerotic bone lesions: Chronic.  Pulmonary nodule noted to be around 1.5 cm in size previously noted to be approximately 1.3 cm.  Patient notes previous history of biopsy that was noted to be benign.  PCP reportedly in the process of setting patient back up with pulmonology for further work-up.  Unclear if patient ever had PSA checked -Check ESR -Consider ambulatory referral to pulmonology,  but not an acute setting of recent PE  Essential hypertension: Blood pressures currently stable. -Continue lisinopril-hydrochlorothiazide  Hyperlipidemia -Continue pravastatin  History of tobacco abuse: Patient reports that he has not smoked in over 2 months. -Encouraged continued cessation of tobacco use  Alcohol abuse: Patient reports that he drinks alcohol regularly and quantifies as "quite a bit". -CIWA protocols   DVT prophylaxis: Heparin Code Status: full Family Communication: No family present at bedside Disposition Plan: The discharge home in 1 to 2 days Consults called: none  Admission status: Observartion  Norval Morton MD [facility] Pager [phone]   If 7PM-7AM, please contact night-coverage [email or web address] Password [facility]  04/12/2019, 12:39 PM

## 2019-04-12 NOTE — Progress Notes (Signed)
Bilateral lower extremity venous duplex has been completed. Preliminary results can be found in CV Proc through chart review.  Results were given to Dr. Regenia Skeeter.  04/12/19 2:11 PM Carlos Levering RVT

## 2019-04-12 NOTE — Progress Notes (Signed)
ANTICOAGULATION CONSULT NOTE  Pharmacy Consult:  Heparin Indication: pulmonary embolus  Not on File  Patient Measurements: Height: 5\' 10"  (177.8 cm) Weight: 189 lb 9.5 oz (86 kg) IBW/kg (Calculated) : 73 Heparin Dosing Weight: 86 kg  Vital Signs: Temp: 97.8 F (36.6 C) (06/08 1555) Temp Source: Oral (06/08 1555) BP: 144/82 (06/08 1555) Pulse Rate: 78 (06/08 1645)  Labs: Recent Labs    04/12/19 0915  HGB 15.6  HCT 46.0  PLT 197  CREATININE 1.20  TROPONINI 0.05*    Estimated Creatinine Clearance: 51.5 mL/min (by C-G formula based on SCr of 1.2 mg/dL).    Assessment: 89 yom presented to the ED with SOB. Found to have an acute PE and started on IV heparin.  He was recently taken off Xarelto.  Heparin level is therapeutic; no bleeding reported.  Goal of Therapy:  Heparin level 0.3-0.7 units/ml Monitor platelets by anticoagulation protocol: Yes   Plan:  Continue heparin gtt at 1250 units/hr F/U AM labs  Tyriana Helmkamp D. Mina Marble, PharmD, BCPS, [place] 04/12/2019, 10:10 PM

## 2019-04-12 NOTE — ED Triage Notes (Signed)
Pt in with c/o sob since last night, worsened this am. Hx of PE's, takes Xarelto. Denies any cough, cp or fevers.

## 2019-04-12 NOTE — Progress Notes (Signed)
  Echocardiogram 2D Echocardiogram with definity has been performed.  Darlina Sicilian M 04/12/2019, 2:32 PM

## 2019-04-12 NOTE — Progress Notes (Signed)
ANTICOAGULATION CONSULT NOTE - Initial Consult  Pharmacy Consult for heparin Indication: pulmonary embolus  Not on File  Patient Measurements: Weight: 170 lb (77.1 kg) Heparin Dosing Weight:   Vital Signs: Temp: 98.2 F (36.8 C) (06/08 0855) Temp Source: Oral (06/08 0855) BP: 121/66 (06/08 1100) Pulse Rate: 91 (06/08 1100)  Labs: Recent Labs    04/12/19 0915  HGB 15.6  HCT 46.0  PLT 197  CREATININE 1.20  TROPONINI 0.05*    CrCl cannot be calculated (Unknown ideal weight.).   Medical History: Past Medical History:  Diagnosis Date  . COPD (chronic obstructive pulmonary disease) ([place])   . Pulmonary embolism ([facility])     Medications:  Infusions:  . heparin      Assessment: 75 yom presented to the ED with SOB. Found to have an acute PE. To start IV heparin. He was recently taken off xarelto. Baseline CBC is WNL.   Goal of Therapy:  Heparin level 0.3-0.7 units/ml Monitor platelets by anticoagulation protocol: Yes   Plan:  Heparin bolus 4500 units IV x 1 Heparin gtt 1250 units/hr Check an 8 hr heparin level Daily heparin level and CBC  Leeroy Lovings, Rande Lawman 04/12/2019,12:18 PM

## 2019-04-12 NOTE — ED Notes (Signed)
Pt recently taken off xarelto by PCP.

## 2019-04-12 NOTE — ED Notes (Addendum)
ED TO INPATIENT HANDOFF REPORT  ED Nurse Name and Phone #:  Thurmond Butts [place] Name/Age/Gender Paulina Fusi 78 y.o. male Room/Bed: 018C/018C  Code Status   Code Status: Full Code  Home/SNF/Other Home Patient oriented to: self, place, time and situation Is this baseline? Yes   Triage Complete: Triage complete  Chief Complaint breathing issues  Triage Note Pt in with c/o sob since last night, worsened this am. Hx of PE's, takes Xarelto. Denies any cough, cp or fevers.    Allergies Not on File  Level of Care/Admitting Diagnosis ED Disposition    ED Disposition Condition [facility]: [place] [[id number]]  Level of Care: Telemetry Medical [104]  I expect the patient will be discharged within 24 hours: Yes  LOW acuity---Tx typically complete <24 hrs---ACUTE conditions typically can be evaluated <24 hours---LABS likely to return to acceptable levels <24 hours---IS near functional baseline---EXPECTED to return to current living arrangement---NOT newly hypoxic: Meets criteria for 5C-Observation unit  Covid Evaluation: Screening Protocol (No Symptoms)  Diagnosis: Pulmonary emboli [facility]) [244010]  Admitting Physician: Norval Morton [[id number]]  Attending Physician: Norval Morton [[id number]]  PT Class (Do Not Modify): Observation [104]  PT Acc Code (Do Not Modify): Observation [10022]       B Medical/Surgery History Past Medical History:  Diagnosis Date  . COPD (chronic obstructive pulmonary disease) ([place])   . Hypertension   . Pulmonary embolism ([place])    History reviewed. No pertinent surgical history.   A IV Location/Drains/Wounds Patient Lines/Drains/Airways Status   Active Line/Drains/Airways    Name:   Placement date:   Placement time:   Site:   Days:   Peripheral IV 04/12/19 Left Antecubital   04/12/19    0954    Antecubital   less than 1          Intake/Output Last 24 hours No intake or output data in the 24 hours ending  04/12/19 1348  Labs/Imaging Results for orders placed or performed during the hospital encounter of 04/12/19 (from the past 48 hour(s))  Basic metabolic panel     Status: Abnormal   Collection Time: 04/12/19  9:15 AM  Result Value Ref Range   Sodium 136 135 - 145 mmol/L   Potassium 4.3 3.5 - 5.1 mmol/L   Chloride 103 98 - 111 mmol/L   CO2 16 (L) 22 - 32 mmol/L   Glucose, Bld 120 (H) 70 - 99 mg/dL   BUN 17 8 - 23 mg/dL   Creatinine, Ser 1.20 0.61 - 1.24 mg/dL   Calcium 9.2 8.9 - 10.3 mg/dL   GFR calc non Af Amer 57 (L) >60 mL/min   GFR calc Af Amer >60 >60 mL/min   Anion gap 17 (H) 5 - 15    Comment: Performed at [facility], [place] [address], [place], [place] [address]  Brain natriuretic peptide     Status: Abnormal   Collection Time: 04/12/19  9:15 AM  Result Value Ref Range   B Natriuretic Peptide 326.6 (H) 0.0 - 100.0 pg/mL    Comment: Performed at [facility] [address], [place], [place] [address]  Troponin I - Once     Status: Abnormal   Collection Time: 04/12/19  9:15 AM  Result Value Ref Range   Troponin I 0.05 (HH) <0.03 ng/mL    Comment: CRITICAL RESULT CALLED TO, READ BACK BY AND VERIFIED WITH: R Sahas Sluka RN [phone] BY A BENNETT  Performed at [facility], [place] [address], [place], [place] [address]   CBC with Differential     Status: None   Collection Time: 04/12/19  9:15 AM  Result Value Ref Range   WBC 9.1 4.0 - 10.5 K/uL   RBC 4.66 4.22 - 5.81 MIL/uL   Hemoglobin 15.6 13.0 - 17.0 g/dL   HCT 46.0 39.0 - 52.0 %   MCV 98.7 80.0 - 100.0 fL   MCH 33.5 26.0 - 34.0 pg   MCHC 33.9 30.0 - 36.0 g/dL   RDW 13.8 11.5 - 15.5 %   Platelets 197 150 - 400 K/uL   nRBC 0.0 0.0 - 0.2 %   Neutrophils Relative % 76 %   Neutro Abs 6.9 1.7 - 7.7 K/uL   Lymphocytes Relative 14 %   Lymphs Abs 1.3 0.7 - 4.0 K/uL   Monocytes Relative 7 %   Monocytes Absolute 0.7 0.1 - 1.0 K/uL   Eosinophils Relative 2 %   Eosinophils Absolute 0.2 0.0 - 0.5 K/uL    Basophils Relative 1 %   Basophils Absolute 0.1 0.0 - 0.1 K/uL   Immature Granulocytes 0 %   Abs Immature Granulocytes 0.04 0.00 - 0.07 K/uL    Comment: Performed at [facility], [address], [place], [place] [address]  SARS Coronavirus 2     Status: None   Collection Time: 04/12/19  9:25 AM  Result Value Ref Range   SARS Coronavirus 2 NOT DETECTED NOT DETECTED    Comment: (NOTE) SARS-CoV-2 target nucleic acids are NOT DETECTED. The SARS-CoV-2 RNA is generally detectable in upper and lower respiratory specimens during the acute phase of infection.  Negative  results do not preclude SARS-CoV-2 infection, do not rule out co-infections with other pathogens, and should not be used as the sole basis for treatment or other patient management decisions.  Negative results must be combined with clinical observations, patient history, and epidemiological information. The expected result is Not Detected. Fact Sheet for Patients: http://www.biofiredefense.com/wp-content/uploads/2020/03/BIOFIRE-COVID -19-patients.pdf Fact Sheet for Healthcare Providers: http://www.biofiredefense.com/wp-content/uploads/2020/03/BIOFIRE-COVID -19-hcp.pdf This test is not yet approved or cleared by the [place] and  has been authorized for detection and/or diagnosis of SARS-CoV-2 by FDA under an Emergency Use Authorization (EUA).  This EUA will remain in effec t (meaning this test can be used) for the duration of  the COVID-19 declaration under Section 564(b)(1) of the Act, 21 U.S.C. section 360bbb-3(b)(1), unless the authorization is terminated or revoked sooner. Performed at [facility], [place] [address], [place], [place] [address]    Ct Angio Chest Pe W And/or Wo Contrast  Result Date: 04/12/2019 CLINICAL DATA:  Shortness of breath. History of pulmonary emboli. Patient taken off Eliquis a few months ago. EXAM: CT ANGIOGRAPHY CHEST WITH CONTRAST TECHNIQUE: Multidetector CT imaging of  the chest was performed using the standard protocol during bolus administration of intravenous contrast. Multiplanar CT image reconstructions and MIPs were obtained to evaluate the vascular anatomy. CONTRAST:  100 mL of Omnipaque 350 COMPARISON:  Chest x-ray April 12, 2019. Chest CT April 08, 2019. Chest CT January 02, 2016. FINDINGS: Cardiovascular: Pulmonary emboli are identified involving all lobes. A large embolus burden is identified, involving multiple lobar and segmental branches. No emboli in the main pulmonary arteries. The main pulmonary artery measures 3.7 cm, not significantly changed given difference in slice selection. Atherosclerotic changes are seen in the nonaneurysmal aorta. No dissection. Coronary artery calcifications are identified. The heart is unchanged. Mediastinum/Nodes: A  small right pleural effusion is stable since the most recent comparison but new since 2017. No left-sided pleural effusion. No pericardial effusion. No adenopathy. Lungs/Pleura: Central airways are normal. No pneumothorax. Again noted is a lingular nodule as seen on series 6, image 80. Vessels run through region of this nodule, better appreciated today given contrast. The maximum size of this nodule is 1.5 cm today versus 1.7 cm April 08, 2019 versus 1.3 cm in February of 2017. At least some of the apparent difference in size compared to 2017 could be due to 5 mm thick slices in 0240 versus 2 mm thick slices today. The difference in measurements since April 08, 2019 could be at least partially due to difference in slice selection and the presence of contrast today. The 3 mm nodule in the left upper lobe on series 6, image 41 is stable. A calcified granuloma is seen in the left base. Other small nodules are stable. Emphysematous changes are again seen in the lungs. A right-sided pleural effusion with adjacent atelectasis is stable since April 08, 2019 but new since January 02, 2016. No suspicious infiltrates. No other changes in  the lungs. Upper Abdomen: Heterogeneous attenuation again seen in the liver, particularly the right hepatic lobe. It would be difficult to exclude an underlying lesion. No other abnormalities in the upper abdomen. Musculoskeletal: Multiple sclerotic lesions in the bones including the left side of the manubrium, multiple ribs,, and vertebral bodies are stable. A compression fracture of a midthoracic vertebral body is stable. Review of the MIP images confirms the above findings. IMPRESSION: 1. Moderate to severe pulmonary embolus burden as above involving all lobes. The left atrium measures 6 cm in greatest dimension while the left ventricle measures 3.3 cm in greatest dimension. Positive for acute PE with CT evidence of right heart strain (RV/LV Ratio = 1.8) consistent with at least submassive (intermediate risk) PE. The presence of right heart strain has been associated with an increased risk of morbidity and mortality. Please activate Code PE by paging [phone]. 2. The lingular nodule measures 1.5 cm today versus 1.7 cm on the April 08, 2019 study and 1.3 cm in February of 2017. Comparison between the studies is somewhat difficult given contrast today compared to April 08, 2019 which was an unenhanced study and significant difference in slice thickness in selection since February 2017. Recommend a PET-CT for further evaluation. There are pulmonary arterial branches coursing through the nodule. Malignancy not excluded on this study. 3. Sclerotic lesions in the manubrium, ribs, and vertebral bodies are unchanged since 2017 and suspicious for sclerotic metastases, possibly treated. Recommend clinical correlation. 4. Heterogeneous attenuation in the liver, particularly the right hepatic lobe, is stable. It would be difficult to exclude underlying lesion on this study. Dedicated imaging of the liver with an enhanced CT scan or an MRI could better evaluate. 5. Stable compression fracture in a midthoracic vertebral body.  6. Other small pulmonary nodules are stable. 7. The main pulmonary artery measures 3.7 cm raising the possibility of pulmonary arterial hypertension. Findings called to Dr. Regenia Skeeter by myself. Electronically Signed   By: Dorise Bullion III M.D   On: 04/12/2019 12:25   Dg Chest Portable 1 View  Result Date: 04/12/2019 CLINICAL DATA:  Shortness of breath EXAM: PORTABLE CHEST 1 VIEW COMPARISON:  04/08/2019 FINDINGS: Cardiac shadows within normal limits. Aortic calcifications are noted. The lungs are well aerated bilaterally without focal infiltrate or effusion. The known lingular nodule is again seen but less conspicuous than on  prior CT examination. No acute bony abnormality is noted. Old rib fractures are seen on the right. IMPRESSION: No acute abnormality noted. The known lingular nodule is not as well appreciated as on recent CT. Electronically Signed   By: Inez Catalina M.D.   On: 04/12/2019 09:27    Pending Labs Unresulted Labs (From admission, onward)    Start     Ordered   04/13/19 0500  Heparin level (unfractionated)  Daily,   R     04/12/19 1218   04/13/19 0500  CBC  Daily,   R     04/12/19 1218   04/13/19 7353  Basic metabolic panel  Tomorrow morning,   R     04/12/19 1320   04/12/19 2100  Heparin level (unfractionated)  Once-Timed,   R     04/12/19 1218   04/12/19 1330  Sedimentation rate  Once,   STAT     04/12/19 1330   04/12/19 1325  TSH  Add-on,   R     04/12/19 1324          Vitals/Pain Today's Vitals   04/12/19 1045 04/12/19 1058 04/12/19 1100 04/12/19 1315  BP: (!) 143/64  121/66 (!) 150/91  Pulse: 99  91   Resp: 20  (!) 23 (!) 21  Temp:      TempSrc:      SpO2: (!) 89%  90%   Weight:      PainSc:  0-No pain      Isolation Precautions Droplet and Contact precautions  Medications Medications  heparin ADULT infusion 100 units/mL (25000 units/267mL sodium chloride 0.45%) (1,250 Units/hr Intravenous New Bag/Given 04/12/19 1242)  lisinopril-hydrochlorothiazide  (ZESTORETIC) 20-25 MG per tablet 1 tablet (has no administration in time range)  pravastatin (PRAVACHOL) tablet 80 mg (has no administration in time range)  traZODone (DESYREL) tablet 100 mg (has no administration in time range)  tiotropium (SPIRIVA) inhalation capsule ([facility] use ONLY) 18 mcg (has no administration in time range)  ondansetron (ZOFRAN) tablet 4 mg (has no administration in time range)    Or  ondansetron (ZOFRAN) injection 4 mg (has no administration in time range)  acetaminophen (TYLENOL) tablet 650 mg (has no administration in time range)    Or  acetaminophen (TYLENOL) suppository 650 mg (has no administration in time range)  albuterol (PROVENTIL) (2.5 MG/3ML) 0.083% nebulizer solution 2.5 mg (has no administration in time range)  LORazepam (ATIVAN) tablet 1 mg (has no administration in time range)    Or  LORazepam (ATIVAN) injection 1 mg (has no administration in time range)  thiamine (VITAMIN B-1) tablet 100 mg (has no administration in time range)    Or  thiamine (B-1) injection 100 mg (has no administration in time range)  folic acid (FOLVITE) tablet 1 mg (has no administration in time range)  multivitamin with minerals tablet 1 tablet (has no administration in time range)  LORazepam (ATIVAN) injection 0-4 mg (has no administration in time range)    Followed by  LORazepam (ATIVAN) injection 0-4 mg (has no administration in time range)  aspirin chewable tablet 324 mg (324 mg Oral Given 04/12/19 1104)  iohexol (OMNIPAQUE) 350 MG/ML injection 100 mL (100 mLs Intravenous Contrast Given 04/12/19 1143)  heparin bolus via infusion 4,500 Units (4,500 Units Intravenous Bolus from Bag 04/12/19 1245)    Mobility walks Low fall risk   Focused Assessments    R Recommendations: See Admitting Provider Note  Report given to: Bri 2W RN  Additional Notes:

## 2019-04-13 DIAGNOSIS — I493 Ventricular premature depolarization: Secondary | ICD-10-CM | POA: Diagnosis present

## 2019-04-13 DIAGNOSIS — I2694 Multiple subsegmental pulmonary emboli without acute cor pulmonale: Secondary | ICD-10-CM | POA: Diagnosis not present

## 2019-04-13 DIAGNOSIS — Z79899 Other long term (current) drug therapy: Secondary | ICD-10-CM | POA: Diagnosis not present

## 2019-04-13 DIAGNOSIS — J9601 Acute respiratory failure with hypoxia: Secondary | ICD-10-CM | POA: Diagnosis present

## 2019-04-13 DIAGNOSIS — J9621 Acute and chronic respiratory failure with hypoxia: Secondary | ICD-10-CM

## 2019-04-13 DIAGNOSIS — R0602 Shortness of breath: Secondary | ICD-10-CM | POA: Diagnosis present

## 2019-04-13 DIAGNOSIS — Z86711 Personal history of pulmonary embolism: Secondary | ICD-10-CM | POA: Diagnosis present

## 2019-04-13 DIAGNOSIS — I2699 Other pulmonary embolism without acute cor pulmonale: Secondary | ICD-10-CM | POA: Diagnosis present

## 2019-04-13 DIAGNOSIS — I248 Other forms of acute ischemic heart disease: Secondary | ICD-10-CM | POA: Diagnosis present

## 2019-04-13 DIAGNOSIS — Z7951 Long term (current) use of inhaled steroids: Secondary | ICD-10-CM | POA: Diagnosis not present

## 2019-04-13 DIAGNOSIS — F1010 Alcohol abuse, uncomplicated: Secondary | ICD-10-CM | POA: Diagnosis present

## 2019-04-13 DIAGNOSIS — Z20828 Contact with and (suspected) exposure to other viral communicable diseases: Secondary | ICD-10-CM | POA: Diagnosis present

## 2019-04-13 DIAGNOSIS — E785 Hyperlipidemia, unspecified: Secondary | ICD-10-CM | POA: Diagnosis present

## 2019-04-13 DIAGNOSIS — J449 Chronic obstructive pulmonary disease, unspecified: Secondary | ICD-10-CM | POA: Diagnosis present

## 2019-04-13 DIAGNOSIS — I10 Essential (primary) hypertension: Secondary | ICD-10-CM | POA: Diagnosis present

## 2019-04-13 DIAGNOSIS — Z87891 Personal history of nicotine dependence: Secondary | ICD-10-CM | POA: Diagnosis not present

## 2019-04-13 DIAGNOSIS — I2510 Atherosclerotic heart disease of native coronary artery without angina pectoris: Secondary | ICD-10-CM | POA: Diagnosis present

## 2019-04-13 DIAGNOSIS — Z7982 Long term (current) use of aspirin: Secondary | ICD-10-CM | POA: Diagnosis not present

## 2019-04-13 DIAGNOSIS — Z823 Family history of stroke: Secondary | ICD-10-CM | POA: Diagnosis not present

## 2019-04-13 DIAGNOSIS — I2609 Other pulmonary embolism with acute cor pulmonale: Principal | ICD-10-CM

## 2019-04-13 DIAGNOSIS — E876 Hypokalemia: Secondary | ICD-10-CM | POA: Diagnosis not present

## 2019-04-13 DIAGNOSIS — R911 Solitary pulmonary nodule: Secondary | ICD-10-CM | POA: Diagnosis present

## 2019-04-13 DIAGNOSIS — I77810 Thoracic aortic ectasia: Secondary | ICD-10-CM | POA: Diagnosis present

## 2019-04-13 LAB — BASIC METABOLIC PANEL
Anion gap: 13 (ref 5–15)
BUN: 18 mg/dL (ref 8–23)
CO2: 21 mmol/L — ABNORMAL LOW (ref 22–32)
Calcium: 9.1 mg/dL (ref 8.9–10.3)
Chloride: 105 mmol/L (ref 98–111)
Creatinine, Ser: 1.12 mg/dL (ref 0.61–1.24)
GFR calc Af Amer: >60 mL/min (ref >60)
GFR calc non Af Amer: >60 mL/min (ref >60)
Glucose, Bld: 109 mg/dL — ABNORMAL HIGH (ref 70–99)
Potassium: 4.2 mmol/L (ref 3.5–5.1)
Sodium: 139 mmol/L (ref 135–145)

## 2019-04-13 LAB — CBC
HCT: 43.5 % (ref 39.0–52.0)
Hemoglobin: 14.9 g/dL (ref 13.0–17.0)
MCH: 33.3 pg (ref 26.0–34.0)
MCHC: 34.3 g/dL (ref 30.0–36.0)
MCV: 97.1 fL (ref 80.0–100.0)
Platelets: 185 10*3/uL (ref 150–400)
RBC: 4.48 MIL/uL (ref 4.22–5.81)
RDW: 13.9 % (ref 11.5–15.5)
WBC: 7.8 10*3/uL (ref 4.0–10.5)
nRBC: 0 % (ref 0.0–0.2)

## 2019-04-13 LAB — MAGNESIUM: Magnesium: 1.8 mg/dL (ref 1.7–2.4)

## 2019-04-13 LAB — TROPONIN I: Troponin I: 0.06 ng/mL (ref <0.03)

## 2019-04-13 LAB — HEPARIN LEVEL (UNFRACTIONATED): Heparin Unfractionated: 0.53 IU/mL (ref 0.30–0.70)

## 2019-04-13 MED ORDER — RIVAROXABAN 20 MG PO TABS: 20.0000 mg | ORAL_TABLET | Freq: Every day | ORAL | Status: DC

## 2019-04-13 MED ORDER — RIVAROXABAN 15 MG PO TABS
15.0000 mg | ORAL_TABLET | Freq: Two times a day (BID) | ORAL | Status: DC
  Administered 2019-04-13 – 2019-04-14 (×3): 15 mg via ORAL
  Filled 2019-04-13 (×3): qty 1

## 2019-04-13 MED ORDER — RIVAROXABAN (XARELTO) VTE STARTER PACK (15 & 20 MG): ORAL_TABLET | ORAL | 0 refills | Status: DC

## 2019-04-13 MED FILL — XARELTO STARTER PACK: 15 & 20 | 30 days supply | Qty: 51 | Fill #0

## 2019-04-13 NOTE — Plan of Care (Signed)
  Problem: Elimination: Goal: Will not experience complications related to bowel motility Outcome: Progressing   Problem: Activity: Goal: Risk for activity intolerance will decrease Outcome: Progressing   Problem: Clinical Measurements: Goal: Respiratory complications will improve Outcome: Progressing   Problem: Safety: Goal: Ability to remain free from injury will improve Outcome: Progressing   Problem: Skin Integrity: Goal: Risk for impaired skin integrity will decrease Outcome: Progressing

## 2019-04-13 NOTE — Progress Notes (Signed)
SATURATION QUALIFICATIONS: (This note is used to comply with regulatory documentation for home oxygen)  Patient Saturations on Room Air at Rest = 91%  Patient Saturations on Room Air while Ambulating = 91%  Patient Saturations on 0Liters of oxygen while Ambulating = 90%

## 2019-04-13 NOTE — TOC Initial Note (Signed)
Transition of Care [facility]) - Initial/Assessment Note    Patient Details  Name: Jason [name] MRN: [id number] Date of Birth: 1941-05-27  Transition [facility] CM/SW Contact:    Zenon Mayo, RN Phone Number: 04/13/2019, 9:24 AM  Clinical Narrative:                 From home alone, he has used xarelto in the past but can not remember if he used the 30 day free coupon, will have [employer] [place] try to run 30 day free coupon for him to see if it will go thru.  He has no problem gettings meds , he has transportation at [place].  He has a PCP at [facility] at [place] per patient.   Expected Discharge Plan: Home/Self Care Barriers to Discharge: No Barriers Identified   Patient Goals and CMS Choice Patient states their goals for this hospitalization and ongoing recovery are:: be able to move around and walk w/out any problems.   Choice offered to / list presented to : NA  Expected Discharge Plan and Services Expected Discharge Plan: Home/Self Care In-house Referral: NA Discharge Planning Services: CM Consult, Medication Assistance   Living arrangements for the past 2 months: Apartment Expected Discharge Date: 04/14/19               DME Arranged: N/A DME Agency: NA       HH Arranged: NA HH Agency: NA        Prior Living Arrangements/Services Living arrangements for the past 2 months: Apartment Lives with:: Self Patient language and need for interpreter reviewed:: Yes Do you feel safe going back to the place where you live?: Yes      Need for Family Participation in Patient Care: No (Comment) Care giver support system in place?: No (comment)   Criminal Activity/Legal Involvement Pertinent to Current Situation/Hospitalization: No - Comment as needed  Activities of Daily Living Home Assistive Devices/Equipment: Hearing aid, Eyeglasses ADL Screening (condition at time of admission) Patient's cognitive ability adequate to safely complete daily activities?: Yes Is the  patient deaf or have difficulty hearing?: Yes Does the patient have difficulty seeing, even when wearing glasses/contacts?: Yes Does the patient have difficulty concentrating, remembering, or making decisions?: No Patient able to express need for assistance with ADLs?: No Does the patient have difficulty dressing or bathing?: No Independently performs ADLs?: Yes (appropriate for developmental age) Does the patient have difficulty walking or climbing stairs?: No Weakness of Legs: None Weakness of Arms/Hands: None  Permission Sought/Granted                  Emotional Assessment Appearance:: Appears stated age Attitude/Demeanor/Rapport: (appropriate)   Orientation: : Oriented to Self, Oriented to Place, Oriented to Situation, Oriented to  Time Alcohol / Substance Use: Not Applicable Psych Involvement: No (comment)  Admission diagnosis:  Acute respiratory failure with hypoxia ([place]) [J96.01] Acute pulmonary embolism with acute cor pulmonale, unspecified pulmonary embolism type ([place]) [I26.09] Patient Active Problem List   Diagnosis Date Noted  . Pulmonary emboli ([place]) 04/12/2019  . COPD without exacerbation ([place]) 04/12/2019  . Elevated troponin 04/12/2019  . Pulmonary nodule 04/12/2019  . Tobacco abuse 04/12/2019  . Alcohol abuse 04/12/2019   PCP:  Patient, No Pcp Per Pharmacy:   [facility] [address] [place] - [place] [address] [place] [place] [address] Phone: [phone] Fax: [phone]     Social Determinants of Health (SDOH) Interventions    Readmission Risk Interventions No flowsheet data  found.  

## 2019-04-13 NOTE — Progress Notes (Signed)
PROGRESS NOTE    Jason [name]  ZOX:[id number] DOB: 08/19/1941 DOA: 04/12/2019 PCP: Lujean Amel, MD  Brief Narrative: 78 y.o. male with medical history significant of COPD, pulmonary nodule, and PE no on anticoagulation; who presents with complaints of progressively worsening shortness of breath since last night.  Patient reports that he moved from [place] in May or June of last year.  He had previous history of pulmonary embolus and had been on Xarelto up until August when his primary care provider took him off of that medication.  To his knowledge there was not anything specific that put him at risk.  He denies having any recent travel/prolonged immobilization, leg swelling, calf pain, palpitations, or chest pain.  Denies any known history of malignancy.  Has a significant history of tobacco abuse, but reports not having smoked in the last 2 months.  He has also been previously worked up for a pulmonary nodule that he reports is been present for 10 to 12 years.  He just had a repeat CT scan last week of his chest that showed pulmonary nodule to be increased in size.  His primary care provider was supposed to be setting him up with a pulmonologist.  Patient denies having any history of irregular heartbeat.  He reports that his mother had a history of blood clots and in the age of 49 after having a stroke.  Lastly patient reports that he drinks quite a bit, but does not formally quantify the amount.  ED Course: On admission to the emergency department patient was seen to be afebrile, pulse 84-1 08, respiration 14-24, and O2 saturations  89 on RA improved to 94% on 2 L nasal cannula.  Significant for CO2 16, anion gap 17, BNP 326.6, and troponin 0.05.  CT angiogram of the chest revealed moderate to severe pulmonary embolus burden involving all lobes with signs of right heart strain.  COVID-19 not detected.  Patient was given 325 mg of aspirin and started on heparin drip per pharmacy.  PCCM was notified, but  did not recommend any need for any acute intervention.   Assessment & Plan:   Principal Problem:   Pulmonary emboli (HCC) Active Problems:   COPD without exacerbation (HCC)   Elevated troponin   Pulmonary nodule   Tobacco abuse   Alcohol abuse   1 pulmonary embolism patient presented with shortness of breath progressive worsening.  He has history of pulmonary embolism in the past was on Xarelto which was stopped in August 2019.  CT of the chest shows extensive pulmonary embolism moderate to severe in all lobes of the lung with right heart strain.  This is a second episode of by PE he probably will need lifelong anticoagulation and an outpatient hematology consult.  Patient is currently on 2 L of oxygen titrate to keep it above 92%.  We will start Xarelto.  Doppler of the lower extremities shows no DVT.  EchocardiogramThe left ventricle has normal systolic function, with an ejection fraction of 55-60%. The cavity size was normal. There is mildly increased left ventricular wall thickness. Left ventricular diastolic Doppler parameters are consistent with impaired  relaxation. D-shaped interventricular septum suggestive of RV pressure/volume overload.   Trivial pericardial effusion is present.  There is mild mitral annular calcification present. No evidence of mitral valve stenosis. No significant mitral regurgitation.  The aortic valve is tricuspid. Mild calcification of the aortic valve. No stenosis of the aortic valve.   There is mild dilatation of the aortic root measuring 39  mm.   The right ventricle has moderately reduced systolic function. The cavity was moderately enlarged. There is no increase in right ventricular wall thickness.  Telemetry with PVCs.  K normal check magnesium.  In view of the right heart strain and the CT findings and echo findings I will watch him overnight and continue telemetry.  If he is stable overnight we will plan discharge tomorrow.  2 COPD stable continue  Spiriva and albuterol  3 elevated troponin secondary to demand ischemia no acute EKG changes  4 chronic pulmonary nodule this is being worked up by PCP.  5 hyperlipidemia continue statin  6 hypertension continue home medication lisinopril hydrochlorothiazide.  7 alcohol abuse on CIWA protocol.  9 tobacco abuse quit smoking 2 months ago.  DVT prophylaxis: Heparin Code Status: full Family Communication: Discussed with patient Disposition Plan: If clinically stable plan discharge tomorrow on Xarelto Consults called discussed with PCCM on the phone  Estimated body mass index is 27.2 kg/m as calculated from the following:   Height as of this encounter: 5\' 10"  (1.778 m).   Weight as of this encounter: 86 kg.   Subjective: Patient resting in bed complains of dyspnea and shortness of breath upon exertion no chest pain  Objective: Vitals:   04/12/19 1706 04/13/19 0808 04/13/19 0810 04/13/19 1100  BP:  137/76    Pulse:  72  73  Resp:  18    Temp:  97.8 F (36.6 C)    TempSrc:      SpO2:  98% 98%   Weight: 86 kg     Height: 5\' 10"  (1.778 m)       Intake/Output Summary (Last 24 hours) at 04/13/2019 1218 Last data filed at 04/13/2019 0400 Gross per 24 hour  Intake 349.92 ml  Output 150 ml  Net 199.92 ml   Filed Weights   04/12/19 0852 04/12/19 1706  Weight: 77.1 kg 86 kg    Examination:  General exam: Appears calm and comfortable  Respiratory system scattered wheezing to auscultation. Respiratory effort normal. Cardiovascular system: S1 & S2 heard, RRR. No JVD, murmurs, rubs, gallops or clicks. No pedal edema. Gastrointestinal system: Abdomen is nondistended, soft and nontender. No organomegaly or masses felt. Normal bowel sounds heard. Central nervous system: Alert and oriented. No focal neurological deficits. Extremities: Symmetric 5 x 5 power. Skin: No rashes, lesions or ulcers Psychiatry: Judgement and insight appear normal. Mood & affect appropriate.     Data  Reviewed: I have personally reviewed following labs and imaging studies  CBC: Recent Labs  Lab 04/12/19 0915 04/13/19 0331  WBC 9.1 7.8  NEUTROABS 6.9  --   HGB 15.6 14.9  HCT 46.0 43.5  MCV 98.7 97.1  PLT 197 539   Basic Metabolic Panel: Recent Labs  Lab 04/12/19 0915 04/13/19 0331  NA 136 139  K 4.3 4.2  CL 103 105  CO2 16* 21*  GLUCOSE 120* 109*  BUN 17 18  CREATININE 1.20 1.12  CALCIUM 9.2 9.1   GFR: Estimated Creatinine Clearance: 55.2 mL/min (by C-G formula based on SCr of 1.12 mg/dL). Liver Function Tests: No results for input(s): AST, ALT, ALKPHOS, BILITOT, PROT, ALBUMIN in the last 168 hours. No results for input(s): LIPASE, AMYLASE in the last 168 hours. No results for input(s): AMMONIA in the last 168 hours. Coagulation Profile: No results for input(s): INR, PROTIME in the last 168 hours. Cardiac Enzymes: Recent Labs  Lab 04/12/19 0915 04/12/19 2116 04/13/19 0331  TROPONINI 0.05* 0.09* 0.06*  BNP (last 3 results) No results for input(s): PROBNP in the last 8760 hours. HbA1C: No results for input(s): HGBA1C in the last 72 hours. CBG: No results for input(s): GLUCAP in the last 168 hours. Lipid Profile: No results for input(s): CHOL, HDL, LDLCALC, TRIG, CHOLHDL, LDLDIRECT in the last 72 hours. Thyroid Function Tests: Recent Labs    04/12/19 1652  TSH 1.714   Anemia Panel: No results for input(s): VITAMINB12, FOLATE, FERRITIN, TIBC, IRON, RETICCTPCT in the last 72 hours. Sepsis Labs: No results for input(s): PROCALCITON, LATICACIDVEN in the last 168 hours.  Recent Results (from the past 240 hour(s))  SARS Coronavirus 2     Status: None   Collection Time: 04/12/19  9:25 AM  Result Value Ref Range Status   SARS Coronavirus 2 NOT DETECTED NOT DETECTED Final    Comment: (NOTE) SARS-CoV-2 target nucleic acids are NOT DETECTED. The SARS-CoV-2 RNA is generally detectable in upper and lower respiratory specimens during the acute phase of  infection.  Negative  results do not preclude SARS-CoV-2 infection, do not rule out co-infections with other pathogens, and should not be used as the sole basis for treatment or other patient management decisions.  Negative results must be combined with clinical observations, patient history, and epidemiological information. The expected result is Not Detected. Fact Sheet for Patients: http://www.biofiredefense.com/wp-content/uploads/2020/03/BIOFIRE-COVID -19-patients.pdf Fact Sheet for Healthcare Providers: http://www.biofiredefense.com/wp-content/uploads/2020/03/BIOFIRE-COVID -19-hcp.pdf This test is not yet approved or cleared by the [place] and  has been authorized for detection and/or diagnosis of SARS-CoV-2 by FDA under an Emergency Use Authorization (EUA).  This EUA will remain in effec t (meaning this test can be used) for the duration of  the COVID-19 declaration under Section 564(b)(1) of the Act, 21 U.S.C. section 360bbb-3(b)(1), unless the authorization is terminated or revoked sooner. Performed at [facility], [place] [address], [place], [place] [address]          Radiology Studies: Ct Angio Chest Pe W And/or Wo Contrast  Result Date: 04/12/2019 CLINICAL DATA:  Shortness of breath. History of pulmonary emboli. Patient taken off Eliquis a few months ago. EXAM: CT ANGIOGRAPHY CHEST WITH CONTRAST TECHNIQUE: Multidetector CT imaging of the chest was performed using the standard protocol during bolus administration of intravenous contrast. Multiplanar CT image reconstructions and MIPs were obtained to evaluate the vascular anatomy. CONTRAST:  100 mL of Omnipaque 350 COMPARISON:  Chest x-ray April 12, 2019. Chest CT April 08, 2019. Chest CT January 02, 2016. FINDINGS: Cardiovascular: Pulmonary emboli are identified involving all lobes. A large embolus burden is identified, involving multiple lobar and segmental branches. No emboli in the main pulmonary arteries. The  main pulmonary artery measures 3.7 cm, not significantly changed given difference in slice selection. Atherosclerotic changes are seen in the nonaneurysmal aorta. No dissection. Coronary artery calcifications are identified. The heart is unchanged. Mediastinum/Nodes: A small right pleural effusion is stable since the most recent comparison but new since 2017. No left-sided pleural effusion. No pericardial effusion. No adenopathy. Lungs/Pleura: Central airways are normal. No pneumothorax. Again noted is a lingular nodule as seen on series 6, image 80. Vessels run through region of this nodule, better appreciated today given contrast. The maximum size of this nodule is 1.5 cm today versus 1.7 cm April 08, 2019 versus 1.3 cm in February of 2017. At least some of the apparent difference in size compared to 2017 could be due to 5 mm thick slices in 3875 versus 2 mm thick slices today. The difference in  measurements since April 08, 2019 could be at least partially due to difference in slice selection and the presence of contrast today. The 3 mm nodule in the left upper lobe on series 6, image 41 is stable. A calcified granuloma is seen in the left base. Other small nodules are stable. Emphysematous changes are again seen in the lungs. A right-sided pleural effusion with adjacent atelectasis is stable since April 08, 2019 but new since January 02, 2016. No suspicious infiltrates. No other changes in the lungs. Upper Abdomen: Heterogeneous attenuation again seen in the liver, particularly the right hepatic lobe. It would be difficult to exclude an underlying lesion. No other abnormalities in the upper abdomen. Musculoskeletal: Multiple sclerotic lesions in the bones including the left side of the manubrium, multiple ribs,, and vertebral bodies are stable. A compression fracture of a midthoracic vertebral body is stable. Review of the MIP images confirms the above findings. IMPRESSION: 1. Moderate to severe pulmonary embolus  burden as above involving all lobes. The left atrium measures 6 cm in greatest dimension while the left ventricle measures 3.3 cm in greatest dimension. Positive for acute PE with CT evidence of right heart strain (RV/LV Ratio = 1.8) consistent with at least submassive (intermediate risk) PE. The presence of right heart strain has been associated with an increased risk of morbidity and mortality. Please activate Code PE by paging [phone]. 2. The lingular nodule measures 1.5 cm today versus 1.7 cm on the April 08, 2019 study and 1.3 cm in February of 2017. Comparison between the studies is somewhat difficult given contrast today compared to April 08, 2019 which was an unenhanced study and significant difference in slice thickness in selection since February 2017. Recommend a PET-CT for further evaluation. There are pulmonary arterial branches coursing through the nodule. Malignancy not excluded on this study. 3. Sclerotic lesions in the manubrium, ribs, and vertebral bodies are unchanged since 2017 and suspicious for sclerotic metastases, possibly treated. Recommend clinical correlation. 4. Heterogeneous attenuation in the liver, particularly the right hepatic lobe, is stable. It would be difficult to exclude underlying lesion on this study. Dedicated imaging of the liver with an enhanced CT scan or an MRI could better evaluate. 5. Stable compression fracture in a midthoracic vertebral body. 6. Other small pulmonary nodules are stable. 7. The main pulmonary artery measures 3.7 cm raising the possibility of pulmonary arterial hypertension. Findings called to Dr. Regenia Skeeter by myself. Electronically Signed   By: Dorise Bullion III M.D   On: 04/12/2019 12:25   Dg Chest Portable 1 View  Result Date: 04/12/2019 CLINICAL DATA:  Shortness of breath EXAM: PORTABLE CHEST 1 VIEW COMPARISON:  04/08/2019 FINDINGS: Cardiac shadows within normal limits. Aortic calcifications are noted. The lungs are well aerated bilaterally  without focal infiltrate or effusion. The known lingular nodule is again seen but less conspicuous than on prior CT examination. No acute bony abnormality is noted. Old rib fractures are seen on the right. IMPRESSION: No acute abnormality noted. The known lingular nodule is not as well appreciated as on recent CT. Electronically Signed   By: Inez Catalina M.D.   On: 04/12/2019 09:27   Vas [place] Lower Extremity Venous (dvt) (mc And Wl 7a-7p)  Result Date: 04/12/2019  Lower Venous Study Indications: Pulmonary embolism.  Performing Technologist: Oliver Hum RVT  Examination Guidelines: A complete evaluation includes B-mode imaging, spectral Doppler, color Doppler, and power Doppler as needed of all accessible portions of each vessel. Bilateral testing is considered an integral part  of a complete examination. Limited examinations for reoccurring indications may be performed as noted.  +---------+---------------+---------+-----------+----------+-------+  RIGHT     Compressibility Phasicity Spontaneity Properties Summary  +---------+---------------+---------+-----------+----------+-------+  CFV       Full            Yes       Yes                             +---------+---------------+---------+-----------+----------+-------+  SFJ       Full                                                      +---------+---------------+---------+-----------+----------+-------+  FV Prox   Full                                                      +---------+---------------+---------+-----------+----------+-------+  FV Mid    Full                                                      +---------+---------------+---------+-----------+----------+-------+  FV Distal Full                                                      +---------+---------------+---------+-----------+----------+-------+  PFV       Full                                                      +---------+---------------+---------+-----------+----------+-------+  POP       Full             Yes       Yes                             +---------+---------------+---------+-----------+----------+-------+  PTV       Full                                                      +---------+---------------+---------+-----------+----------+-------+  PERO      Full                                                      +---------+---------------+---------+-----------+----------+-------+   +---------+---------------+---------+-----------+----------+-------+  LEFT      Compressibility Phasicity Spontaneity Properties Summary  +---------+---------------+---------+-----------+----------+-------+  CFV       Full  Yes       Yes                             +---------+---------------+---------+-----------+----------+-------+  SFJ       Full                                                      +---------+---------------+---------+-----------+----------+-------+  FV Prox   Full                                                      +---------+---------------+---------+-----------+----------+-------+  FV Mid    Full                                                      +---------+---------------+---------+-----------+----------+-------+  FV Distal Full                                                      +---------+---------------+---------+-----------+----------+-------+  PFV       Full                                                      +---------+---------------+---------+-----------+----------+-------+  POP       Full            Yes       Yes                             +---------+---------------+---------+-----------+----------+-------+  PTV       Full                                                      +---------+---------------+---------+-----------+----------+-------+  PERO      Full                                                      +---------+---------------+---------+-----------+----------+-------+     Summary: Right: There is no evidence of deep vein thrombosis in the lower extremity. No cystic  structure found in the popliteal fossa. Left: There is no evidence of deep vein thrombosis in the lower extremity. No cystic structure found in the popliteal fossa.  *See table(s) above for measurements and observations. Electronically signed by Monica Martinez MD on 04/12/2019 at 4:26:50 PM.    Final  Scheduled Meds:  folic acid  1 mg Oral Daily   lisinopril  20 mg Oral Daily   And   hydrochlorothiazide  25 mg Oral Daily   LORazepam  0-4 mg Intravenous Q6H   Followed by   Derrill Memo ON 04/14/2019] LORazepam  0-4 mg Intravenous Q12H   multivitamin with minerals  1 tablet Oral Daily   pravastatin  80 mg Oral Daily   rivaroxaban  15 mg Oral BID WC   [START ON 05/04/2019] rivaroxaban  20 mg Oral Q supper   thiamine  100 mg Oral Daily   Or   thiamine  100 mg Intravenous Daily   traZODone  100 mg Oral QHS   umeclidinium bromide  1 puff Inhalation Daily   Continuous Infusions:   LOS: 0 days     Georgette Shell, MD [facility]  If 7PM-7AM, please contact night-coverage [email or web address] Password [email or web address] 04/13/2019, 12:18 PM

## 2019-04-13 NOTE — Progress Notes (Signed)
ANTICOAGULATION CONSULT NOTE - Initial Consult  Pharmacy Consult for Heparin>>>>>Xarelto Indication: pulmonary embolus  Not on File  Patient Measurements: Height: 5\' 10"  (177.8 cm) Weight: 189 lb 9.5 oz (86 kg) IBW/kg (Calculated) : 73  Vital Signs:    Labs: Recent Labs    04/12/19 0915 04/12/19 2116 04/13/19 0331  HGB 15.6  --  14.9  HCT 46.0  --  43.5  PLT 197  --  185  HEPARINUNFRC  --  0.51 0.53  CREATININE 1.20  --  1.12  TROPONINI 0.05* 0.09* 0.06*    Estimated Creatinine Clearance: 55.2 mL/min (by C-G formula based on SCr of 1.12 mg/dL).   Medical History: Past Medical History:  Diagnosis Date  . COPD (chronic obstructive pulmonary disease) ([place])   . Hypertension   . Pulmonary embolism [facility])     Assessment: 78 y/o M on heparin drip for PE. Now with plans to transition to Xarelto. Pt has been on Xarelto in the past for the same indication. CBC/renal function good.   Goal of Therapy:  Monitor platelets by anticoagulation protocol: Yes   Plan:  DC heparin drip Start Xarelto 15 mg BID for 21 days, then 20 mg daily with supper Daily CBC while inpatient Monitor for bleeding  Narda Bonds 04/13/2019,7:16 AM

## 2019-04-14 LAB — CBC WITH DIFFERENTIAL/PLATELET
Abs Immature Granulocytes: 0.03 10*3/uL (ref 0.00–0.07)
Basophils Absolute: 0.1 10*3/uL (ref 0.0–0.1)
Basophils Relative: 1 %
Eosinophils Absolute: 0.2 10*3/uL (ref 0.0–0.5)
Eosinophils Relative: 3 %
HCT: 45.2 % (ref 39.0–52.0)
Hemoglobin: 15.5 g/dL (ref 13.0–17.0)
Immature Granulocytes: 1 %
Lymphocytes Relative: 18 %
Lymphs Abs: 1.2 10*3/uL (ref 0.7–4.0)
MCH: 33.3 pg (ref 26.0–34.0)
MCHC: 34.3 g/dL (ref 30.0–36.0)
MCV: 97.2 fL (ref 80.0–100.0)
Monocytes Absolute: 0.7 10*3/uL (ref 0.1–1.0)
Monocytes Relative: 11 %
Neutro Abs: 4.3 10*3/uL (ref 1.7–7.7)
Neutrophils Relative %: 66 %
Platelets: 176 10*3/uL (ref 150–400)
RBC: 4.65 MIL/uL (ref 4.22–5.81)
RDW: 13.7 % (ref 11.5–15.5)
WBC: 6.4 10*3/uL (ref 4.0–10.5)
nRBC: 0 % (ref 0.0–0.2)

## 2019-04-14 LAB — COMPREHENSIVE METABOLIC PANEL
ALT: 34 U/L (ref 0–44)
AST: 27 U/L (ref 15–41)
Albumin: 3.3 g/dL — ABNORMAL LOW (ref 3.5–5.0)
Alkaline Phosphatase: 47 U/L (ref 38–126)
Anion gap: 11 (ref 5–15)
BUN: 15 mg/dL (ref 8–23)
CO2: 21 mmol/L — ABNORMAL LOW (ref 22–32)
Calcium: 9.1 mg/dL (ref 8.9–10.3)
Chloride: 105 mmol/L (ref 98–111)
Creatinine, Ser: 0.85 mg/dL (ref 0.61–1.24)
GFR calc Af Amer: >60 mL/min (ref >60)
GFR calc non Af Amer: >60 mL/min (ref >60)
Glucose, Bld: 115 mg/dL — ABNORMAL HIGH (ref 70–99)
Potassium: 3.3 mmol/L — ABNORMAL LOW (ref 3.5–5.1)
Sodium: 137 mmol/L (ref 135–145)
Total Bilirubin: 1 mg/dL (ref 0.3–1.2)
Total Protein: 6.6 g/dL (ref 6.5–8.1)

## 2019-04-14 MED ORDER — POTASSIUM CHLORIDE CRYS ER 20 MEQ PO TBCR: 40.0000 meq | EXTENDED_RELEASE_TABLET | Freq: Every day | ORAL | 0 refills | Status: DC

## 2019-04-14 MED ORDER — ASPIRIN EC 81 MG PO TBEC: 81.0000 mg | DELAYED_RELEASE_TABLET | Freq: Every day | ORAL | 0 refills | Status: AC

## 2019-04-14 MED ORDER — POTASSIUM CHLORIDE CRYS ER 20 MEQ PO TBCR
40.0000 meq | EXTENDED_RELEASE_TABLET | Freq: Two times a day (BID) | ORAL | Status: DC
  Administered 2019-04-14: 40 meq via ORAL
  Filled 2019-04-14: qty 2

## 2019-04-14 NOTE — Progress Notes (Signed)
Pharmacy - Xarelto  Xarelto for PE (15 mg po BID x 3 weeks then 20 mg daily) 30 day supply delivered to room Education complete  Thank you Anette Guarneri, PharmD

## 2019-04-14 NOTE — Plan of Care (Signed)
  Problem: Clinical Measurements: Goal: Ability to maintain clinical measurements within normal limits will improve Outcome: Progressing   Problem: Activity: Goal: Risk for activity intolerance will decrease Outcome: Progressing   Problem: Education: Goal: Knowledge of General Education information will improve Description Including pain rating scale, medication(s)/side effects and non-pharmacologic comfort measures Outcome: Progressing   Problem: Nutrition: Goal: Adequate nutrition will be maintained Outcome: Progressing   Problem: Coping: Goal: Level of anxiety will decrease Outcome: Progressing

## 2019-04-14 NOTE — TOC Benefit Eligibility Note (Signed)
Transition of Care [facility]) Benefit Eligibility Note    Patient Details  Name: Jason [name] MRN: [id number] Date of Birth: 10/08/1941   Medication/Dose: Alveda Reasons  15 MG BID (XARELTO 20 MG DAILY   SAME)  Covered?: Yes  Tier: 3 Drug  Prescription Coverage Preferred Pharmacy: [facility])  Spoke with Person/Company/Phone Number:: STEPHANIE([employer] # [phone])  Co-Pay: $45.00  Prior Approval: No  Deductible: Met       Memory Argue Phone Number: 04/14/2019, 11:58 AM

## 2019-04-14 NOTE — Discharge Summary (Addendum)
Discharge Summary  Jason [name] NFA:[id number] DOB: 19-Dec-1940  PCP: Lujean Amel, MD  Admit date: 04/12/2019 Discharge date: 04/14/2019  Time spent: 35 minutes  Recommendations for Outpatient Follow-up:  1. Follow-up with your primary care provider 2. Take your medications as prescribed 3. Fall precautions  Discharge Diagnoses:  Active Hospital Problems   Diagnosis Date Noted   Pulmonary emboli ([place]) 04/12/2019   Pulmonary embolism ([place]) 04/13/2019   Acute respiratory failure with hypoxia (HCC)    COPD without exacerbation ([place]) 04/12/2019   Elevated troponin 04/12/2019   Pulmonary nodule 04/12/2019   Tobacco abuse 04/12/2019   Alcohol abuse 04/12/2019    Resolved Hospital Problems  No resolved problems to display.    Discharge Condition: Stable  Diet recommendation: Resume previous diet  Vitals:   04/14/19 0742 04/14/19 0811  BP:  135/79  Pulse:  70  Resp:  18  Temp:  98.7 F (37.1 C)  SpO2: 97% 97%    History of present illness:   78 y.o.malewith medical history significant ofCOPD, pulmonary nodule, and PE no on anticoagulation; who presents with complaints of progressively worsening shortness of breath since last night. Patient reports that he moved from [place] in May or June of last year. He had previous history of pulmonary embolus and had been on Xarelto up until August when his primary care provider took him off of that medication. To his knowledge there was not anything specific that put him at risk. He denies having any recent travel/prolonged immobilization, leg swelling, calf pain, palpitations, or chest pain.Denies any known history of malignancy. Has a significant history of tobacco abuse, but reports not having smoked in the last 2 months. He has also been previously worked up for a pulmonary nodule that he reports is been present for 10 to 12 years. He just had a repeat CT scan last week of his chest that showed pulmonary nodule to be  increased in size. His primary care provider was supposed to be setting him up with a pulmonologist. Patient denies having any history of irregular heartbeat. He reports that his mother had a history of blood clots and in the age of 79 after having a stroke.Lastly patient reports that he drinks quite a bit, but does not formally quantify the amount.  ED Course:On admission to the emergency department patient was seen to be afebrile, pulse 84-1 08, respiration 14-24,andO2 saturations 89 on RAimproved to94% on2 L nasal cannula. Significant for CO2 16, anion gap 17, BNP 326.6, andtroponin 0.05.CT angiogram of the chest revealed moderate to severe pulmonary embolus burden involving all lobes with signs of right heart strain. COVID-19 not detected. Patient was given 325 mg of aspirin and started on heparin drip per pharmacy.PCCM was notified,but did not recommend any need for any acute intervention.  04/14/19: Patient seen and examined at bedside.  Vital signs reviewed and are stable.  He has no new concerns.  Denies chest pain, shortness of breath or palpitations.  He wants to go home.  On the day of discharge, the patient was hemodynamically stable.  He will need to follow-up with his primary care provider posthospitalization.  Recommend outpatient hematology follow-up.  Please obtain referral from your primary care provider.     Hospital Course:  Principal Problem:   Pulmonary emboli (HCC) Active Problems:   COPD without exacerbation (HCC)   Elevated troponin   Pulmonary nodule   Tobacco abuse   Alcohol abuse   Pulmonary embolism (HCC)   Acute respiratory failure with hypoxia (HCC)  1 pulmonary embolism patient presented with shortness of breath progressive worsening.  He has history of pulmonary embolism in the past was on Xarelto which was stopped in August 2019.  CT of the chest shows extensive pulmonary embolism moderate to severe in all lobes of the lung with right  heart strain.  This is a second episode of by PE he probably will need lifelong anticoagulation and an outpatient hematology consult.  Currently on Xarelto.  Doppler of the lower extremities shows no DVT.  EchocardiogramThe left ventricle has normal systolic function, with an ejection fraction of 55-60%. The cavity size was normal. There is mildly increased left ventricular wall thickness. Left ventricular diastolic Doppler parameters are consistent with impaired  relaxation. D-shaped interventricular septum suggestive of RV pressure/volume overload.  Trivial pericardial effusion is present. There is mild mitral annular calcification present. No evidence of mitral valve stenosis. No significant mitral regurgitation. The aortic valve is tricuspid. Mild calcification of the aortic valve. No stenosis of the aortic valve.  There is mild dilatation of the aortic root measuring 39 mm.  The right ventricle has moderately reduced systolic function. The cavity was moderately enlarged. There is no increase in right ventricular wall thickness.  2 COPD stable continue Spiriva and albuterol  3 elevated troponin secondary to demand ischemia no acute EKG changes  4 chronic pulmonary nodule this is being worked up by PCP.  5 hyperlipidemia continue statin  6 hypertension continue home medication lisinopril hydrochlorothiazide.  7 alcohol abuse on CIWA protocol. No sign of withdrawal on the day of discharge.  Vital signs reviewed and are stable.  Physical exam unremarkable.  9 tobacco abuse quit smoking 2 months ago.  10 hypokalemia potassium 3.3 repleted, continue potassium 40 mEq daily x7 days.  Repeat BMP on Friday   Code Status:full  Discharge Exam: BP 135/79    Pulse 70    Temp 98.7 F (37.1 C)    Resp 18    Ht 5\' 10"  (1.778 m)    Wt 86 kg    SpO2 97%    BMI 27.20 kg/m   General: 78 y.o. year-old male well developed well nourished in no acute distress.  Alert and oriented  x3.  Cardiovascular: Regular rate and rhythm with no rubs or gallops.  No thyromegaly or JVD noted.    Respiratory: Clear to auscultation with no wheezes or rales. Good inspiratory effort.  Abdomen: Soft nontender nondistended with normal bowel sounds x4 quadrants.  Musculoskeletal: No lower extremity edema. 2/4 pulses in all 4 extremities.  Skin: No ulcerative lesions noted or rashes,  Psychiatry: Mood is appropriate for condition and setting  Discharge Instructions You were cared for by a hospitalist during your hospital stay. If you have any questions about your discharge medications or the care you received while you were in the hospital after you are discharged, you can call the unit and asked to speak with the hospitalist on call if the hospitalist that took care of you is not available. Once you are discharged, your primary care physician will handle any further medical issues. Please note that NO REFILLS for any discharge medications will be authorized once you are discharged, as it is imperative that you return to your primary care physician (or establish a relationship with a primary care physician if you do not have one) for your aftercare needs so that they can reassess your need for medications and monitor your lab values.   Allergies as of 04/14/2019   Not on File  Medication List    TAKE these medications   aspirin EC 81 MG tablet Take 1 tablet (81 mg total) by mouth daily for 30 days. What changed:    medication strength  how much to take   JOINT FORMULA PO Take 1 tablet by mouth daily.   lisinopril-hydrochlorothiazide 20-25 MG tablet Commonly known as:  ZESTORETIC Take 1 tablet by mouth daily.   multivitamin with minerals tablet Take 1 tablet by mouth daily.   potassium chloride SA 20 MEQ tablet Commonly known as:  K-DUR Take 2 tablets (40 mEq total) by mouth daily.   pravastatin 80 MG tablet Commonly known as:  PRAVACHOL Take 80 mg by mouth  daily.   Rivaroxaban 15 & 20 MG Tbpk Take as directed on package: Start with one 15mg  tablet by mouth twice a day with food. On Day 22, switch to one 20mg  tablet once a day with food.   Spiriva HandiHaler 18 MCG inhalation capsule Generic drug:  tiotropium Place 1 capsule into inhaler and inhale daily.   traZODone 100 MG tablet Commonly known as:  DESYREL Take 100 mg by mouth at bedtime.   Vitamin D (Cholecalciferol) 50 MCG (2000 UT) Caps Take 2,000 Units by mouth daily.      Not on File    The results of significant diagnostics from this hospitalization (including imaging, microbiology, ancillary and laboratory) are listed below for reference.    Significant Diagnostic Studies: Ct Chest Wo Contrast  Result Date: 04/08/2019 CLINICAL DATA:  Follow-up pulmonary nodules EXAM: CT CHEST WITHOUT CONTRAST TECHNIQUE: Multidetector CT imaging of the chest was performed following the standard protocol without IV contrast. COMPARISON:  01/02/2016 FINDINGS: Cardiovascular: Extensive aortic atherosclerosis. Three-vessel coronary artery calcifications. Normal heart size. No pericardial effusion. Mediastinum/Nodes: No enlarged mediastinal, hilar, or axillary lymph nodes. Thyroid gland, trachea, and esophagus demonstrate no significant findings. Lungs/Pleura: Moderate centrilobular emphysema. A spiculated nodule of the lingula has enlarged compared to prior examination, measuring 1.7 cm, previously 1.1 cm (series 3, image 97). Additional stable small, benign pulmonary nodules, for example a 4 mm nodule of the left upper lobe (series 3, image 52). Small, chronic appearing right pleural effusion and/or pleural thickening with associated atelectasis or consolidation. Upper Abdomen: Heterogeneous texture of the liver, possibly with a lesion of the right lobe of the liver (series 2, image 157). Musculoskeletal: There are multiple sclerotic osseous lesions involving the left aspect of the manubrium (series 3,  image 32), multiple bilateral ribs (series 3, image 70), and multiple vertebral bodies, most conspicuously the T12 vertebral body (series 6, image 84). IMPRESSION: 1. A spiculated nodule of the lingula has enlarged compared to prior examination, measuring 1.7 cm, previously 1.1 cm (series 3, image 97). This finding is concerning for malignancy, likely primary although metastatic disease is not excluded given the presence of osseous lesions described below. Consider PET-CT for metabolic characterization. This nodule may also be amenable to percutaneous CT-guided biopsy. There are additional stable small, benign pulmonary nodules, for example a 4 mm nodule of the left upper lobe (series 3, image 52). 2. Small, chronic appearing right pleural effusion and/or pleural thickening with associated atelectasis or consolidation. 3. There are multiple sclerotic osseous lesions involving the left aspect of the manubrium (series 3, image 32), multiple bilateral ribs (series 3, image 70), and multiple vertebral bodies, most conspicuously the T12 vertebral body (series 6, image 84). These do not appear significantly changed compared to prior CT dated 99/35/7017 are of uncertain significance, perhaps post treatment osseous metastatic  disease. 4. Heterogeneous texture of the liver, possibly with a lesion of the right lobe of the liver (series 2, image 157). This is incompletely imaged and incompletely evaluated on noncontrast CT. Correlate with clinical history and consider contrast enhanced CT of the abdomen and pelvis. 5.  Emphysema. 6.  Coronary artery disease. Electronically Signed   By: Eddie Candle M.D.   On: 04/08/2019 13:05   Ct Angio Chest Pe W And/or Wo Contrast  Result Date: 04/12/2019 CLINICAL DATA:  Shortness of breath. History of pulmonary emboli. Patient taken off Eliquis a few months ago. EXAM: CT ANGIOGRAPHY CHEST WITH CONTRAST TECHNIQUE: Multidetector CT imaging of the chest was performed using the standard  protocol during bolus administration of intravenous contrast. Multiplanar CT image reconstructions and MIPs were obtained to evaluate the vascular anatomy. CONTRAST:  100 mL of Omnipaque 350 COMPARISON:  Chest x-ray April 12, 2019. Chest CT April 08, 2019. Chest CT January 02, 2016. FINDINGS: Cardiovascular: Pulmonary emboli are identified involving all lobes. A large embolus burden is identified, involving multiple lobar and segmental branches. No emboli in the main pulmonary arteries. The main pulmonary artery measures 3.7 cm, not significantly changed given difference in slice selection. Atherosclerotic changes are seen in the nonaneurysmal aorta. No dissection. Coronary artery calcifications are identified. The heart is unchanged. Mediastinum/Nodes: A small right pleural effusion is stable since the most recent comparison but new since 2017. No left-sided pleural effusion. No pericardial effusion. No adenopathy. Lungs/Pleura: Central airways are normal. No pneumothorax. Again noted is a lingular nodule as seen on series 6, image 80. Vessels run through region of this nodule, better appreciated today given contrast. The maximum size of this nodule is 1.5 cm today versus 1.7 cm April 08, 2019 versus 1.3 cm in February of 2017. At least some of the apparent difference in size compared to 2017 could be due to 5 mm thick slices in 7017 versus 2 mm thick slices today. The difference in measurements since April 08, 2019 could be at least partially due to difference in slice selection and the presence of contrast today. The 3 mm nodule in the left upper lobe on series 6, image 41 is stable. A calcified granuloma is seen in the left base. Other small nodules are stable. Emphysematous changes are again seen in the lungs. A right-sided pleural effusion with adjacent atelectasis is stable since April 08, 2019 but new since January 02, 2016. No suspicious infiltrates. No other changes in the lungs. Upper Abdomen: Heterogeneous  attenuation again seen in the liver, particularly the right hepatic lobe. It would be difficult to exclude an underlying lesion. No other abnormalities in the upper abdomen. Musculoskeletal: Multiple sclerotic lesions in the bones including the left side of the manubrium, multiple ribs,, and vertebral bodies are stable. A compression fracture of a midthoracic vertebral body is stable. Review of the MIP images confirms the above findings. IMPRESSION: 1. Moderate to severe pulmonary embolus burden as above involving all lobes. The left atrium measures 6 cm in greatest dimension while the left ventricle measures 3.3 cm in greatest dimension. Positive for acute PE with CT evidence of right heart strain (RV/LV Ratio = 1.8) consistent with at least submassive (intermediate risk) PE. The presence of right heart strain has been associated with an increased risk of morbidity and mortality. Please activate Code PE by paging [phone]. 2. The lingular nodule measures 1.5 cm today versus 1.7 cm on the April 08, 2019 study and 1.3 cm in February of 2017. Comparison  between the studies is somewhat difficult given contrast today compared to April 08, 2019 which was an unenhanced study and significant difference in slice thickness in selection since February 2017. Recommend a PET-CT for further evaluation. There are pulmonary arterial branches coursing through the nodule. Malignancy not excluded on this study. 3. Sclerotic lesions in the manubrium, ribs, and vertebral bodies are unchanged since 2017 and suspicious for sclerotic metastases, possibly treated. Recommend clinical correlation. 4. Heterogeneous attenuation in the liver, particularly the right hepatic lobe, is stable. It would be difficult to exclude underlying lesion on this study. Dedicated imaging of the liver with an enhanced CT scan or an MRI could better evaluate. 5. Stable compression fracture in a midthoracic vertebral body. 6. Other small pulmonary nodules are  stable. 7. The main pulmonary artery measures 3.7 cm raising the possibility of pulmonary arterial hypertension. Findings called to Dr. Regenia Skeeter by myself. Electronically Signed   By: Dorise Bullion III M.D   On: 04/12/2019 12:25   Dg Chest Portable 1 View  Result Date: 04/12/2019 CLINICAL DATA:  Shortness of breath EXAM: PORTABLE CHEST 1 VIEW COMPARISON:  04/08/2019 FINDINGS: Cardiac shadows within normal limits. Aortic calcifications are noted. The lungs are well aerated bilaterally without focal infiltrate or effusion. The known lingular nodule is again seen but less conspicuous than on prior CT examination. No acute bony abnormality is noted. Old rib fractures are seen on the right. IMPRESSION: No acute abnormality noted. The known lingular nodule is not as well appreciated as on recent CT. Electronically Signed   By: Inez Catalina M.D.   On: 04/12/2019 09:27   Vas [place] Lower Extremity Venous (dvt) (mc And Wl 7a-7p)  Result Date: 04/12/2019  Lower Venous Study Indications: Pulmonary embolism.  Performing Technologist: Oliver Hum RVT  Examination Guidelines: A complete evaluation includes B-mode imaging, spectral Doppler, color Doppler, and power Doppler as needed of all accessible portions of each vessel. Bilateral testing is considered an integral part of a complete examination. Limited examinations for reoccurring indications may be performed as noted.  +---------+---------------+---------+-----------+----------+-------+  RIGHT     Compressibility Phasicity Spontaneity Properties Summary  +---------+---------------+---------+-----------+----------+-------+  CFV       Full            Yes       Yes                             +---------+---------------+---------+-----------+----------+-------+  SFJ       Full                                                      +---------+---------------+---------+-----------+----------+-------+  FV Prox   Full                                                       +---------+---------------+---------+-----------+----------+-------+  FV Mid    Full                                                      +---------+---------------+---------+-----------+----------+-------+  FV Distal Full                                                      +---------+---------------+---------+-----------+----------+-------+  PFV       Full                                                      +---------+---------------+---------+-----------+----------+-------+  POP       Full            Yes       Yes                             +---------+---------------+---------+-----------+----------+-------+  PTV       Full                                                      +---------+---------------+---------+-----------+----------+-------+  PERO      Full                                                      +---------+---------------+---------+-----------+----------+-------+   +---------+---------------+---------+-----------+----------+-------+  LEFT      Compressibility Phasicity Spontaneity Properties Summary  +---------+---------------+---------+-----------+----------+-------+  CFV       Full            Yes       Yes                             +---------+---------------+---------+-----------+----------+-------+  SFJ       Full                                                      +---------+---------------+---------+-----------+----------+-------+  FV Prox   Full                                                      +---------+---------------+---------+-----------+----------+-------+  FV Mid    Full                                                      +---------+---------------+---------+-----------+----------+-------+  FV Distal Full                                                      +---------+---------------+---------+-----------+----------+-------+  PFV       Full                                                      +---------+---------------+---------+-----------+----------+-------+  POP       Full             Yes       Yes                             +---------+---------------+---------+-----------+----------+-------+  PTV       Full                                                      +---------+---------------+---------+-----------+----------+-------+  PERO      Full                                                      +---------+---------------+---------+-----------+----------+-------+     Summary: Right: There is no evidence of deep vein thrombosis in the lower extremity. No cystic structure found in the popliteal fossa. Left: There is no evidence of deep vein thrombosis in the lower extremity. No cystic structure found in the popliteal fossa.  *See table(s) above for measurements and observations. Electronically signed by Monica Martinez MD on 04/12/2019 at 4:26:50 PM.    Final     Microbiology: Recent Results (from the past 240 hour(s))  SARS Coronavirus 2     Status: None   Collection Time: 04/12/19  9:25 AM  Result Value Ref Range Status   SARS Coronavirus 2 NOT DETECTED NOT DETECTED Final    Comment: (NOTE) SARS-CoV-2 target nucleic acids are NOT DETECTED. The SARS-CoV-2 RNA is generally detectable in upper and lower respiratory specimens during the acute phase of infection.  Negative  results do not preclude SARS-CoV-2 infection, do not rule out co-infections with other pathogens, and should not be used as the sole basis for treatment or other patient management decisions.  Negative results must be combined with clinical observations, patient history, and epidemiological information. The expected result is Not Detected. Fact Sheet for Patients: http://www.biofiredefense.com/wp-content/uploads/2020/03/BIOFIRE-COVID -19-patients.pdf Fact Sheet for Healthcare Providers: http://www.biofiredefense.com/wp-content/uploads/2020/03/BIOFIRE-COVID -19-hcp.pdf This test is not yet approved or cleared by the [place] and  has been authorized for detection and/or diagnosis of  SARS-CoV-2 by FDA under an Emergency Use Authorization (EUA).  This EUA will remain in effec t (meaning this test can be used) for the duration of  the COVID-19 declaration under Section 564(b)(1) of the Act, 21 U.S.C. section 360bbb-3(b)(1), unless the authorization is terminated or revoked sooner. Performed at [facility], [place] [address], [place], [place] [address]      Labs: Basic Metabolic Panel: Recent Labs  Lab 04/12/19 0915 04/13/19 0331 04/14/19 0545  NA 136 139 137  K 4.3 4.2 3.3*  CL 103 105 105  CO2 16* 21* 21*  GLUCOSE 120* 109* 115*  BUN 17 18 15   CREATININE 1.20 1.12 0.85  CALCIUM 9.2  9.1 9.1  MG  --  1.8  --    Liver Function Tests: Recent Labs  Lab 04/14/19 0545  AST 27  ALT 34  ALKPHOS 47  BILITOT 1.0  PROT 6.6  ALBUMIN 3.3*   No results for input(s): LIPASE, AMYLASE in the last 168 hours. No results for input(s): AMMONIA in the last 168 hours. CBC: Recent Labs  Lab 04/12/19 0915 04/13/19 0331 04/14/19 0545  WBC 9.1 7.8 6.4  NEUTROABS 6.9  --  4.3  HGB 15.6 14.9 15.5  HCT 46.0 43.5 45.2  MCV 98.7 97.1 97.2  PLT 197 185 176   Cardiac Enzymes: Recent Labs  Lab 04/12/19 0915 04/12/19 2116 04/13/19 0331  TROPONINI 0.05* 0.09* 0.06*   BNP: BNP (last 3 results) Recent Labs    04/12/19 0915  BNP 326.6*    ProBNP (last 3 results) No results for input(s): PROBNP in the last 8760 hours.  CBG: No results for input(s): GLUCAP in the last 168 hours.     Signed:  Kayleen Memos, MD [facility] 04/14/2019, 10:17 AM

## 2019-04-14 NOTE — Discharge Instructions (Signed)
Hypokalemia Hypokalemia means that the amount of potassium in the blood is lower than normal.Potassium is a chemical that helps regulate the amount of fluid in the body (electrolyte). It also stimulates muscle tightening (contraction) and helps nerves work properly.Normally, most of the bodys potassium is inside of cells, and only a very small amount is in the blood. Because the amount in the blood is so small, minor changes to potassium levels in the blood can be life-threatening. What are the causes? This condition may be caused by:  Antibiotic medicine.  Diarrhea or vomiting. Taking too much of a medicine that helps you have a bowel movement (laxative) can cause diarrhea and lead to hypokalemia.  Chronic kidney disease (CKD).  Medicines that help the body get rid of excess fluid (diuretics).  Eating disorders, such as bulimia.  Low magnesium levels in the body.  Sweating a lot. What are the signs or symptoms? Symptoms of this condition include:  Weakness.  Constipation.  Fatigue.  Muscle cramps.  Mental confusion.  Skipped heartbeats or irregular heartbeat (palpitations).  Tingling or numbness. How is this diagnosed? This condition is diagnosed with a blood test. How is this treated? Hypokalemia can be treated by taking potassium supplements by mouth or adjusting the medicines that you take. Treatment may also include eating more foods that contain a lot of potassium. If your potassium level is very low, you may need to get potassium through an IV tube in one of your veins and be monitored in the hospital. Follow these instructions at home:   Take over-the-counter and prescription medicines only as told by your health care provider. This includes vitamins and supplements.  Eat a healthy diet. A healthy diet includes fresh fruits and vegetables, whole grains, healthy fats, and lean proteins.  If instructed, eat more foods that contain a lot of potassium, such  as: ? Nuts, such as peanuts and pistachios. ? Seeds, such as sunflower seeds and pumpkin seeds. ? Peas, lentils, and lima beans. ? Whole grain and bran cereals and breads. ? Fresh fruits and vegetables, such as apricots, avocado, bananas, cantaloupe, kiwi, oranges, tomatoes, asparagus, and potatoes. ? Orange juice. ? Tomato juice. ? Red meats. ? Yogurt.  Keep all follow-up visits as told by your health care provider. This is important. Contact a health care provider if:  You have weakness that gets worse.  You feel your heart pounding or racing.  You vomit.  You have diarrhea.  You have diabetes (diabetes mellitus) and you have trouble keeping your blood sugar (glucose) in your target range. Get help right away if:  You have chest pain.  You have shortness of breath.  You have vomiting or diarrhea that lasts for more than 2 days.  You faint. This information is not intended to replace advice given to you by your health care provider. Make sure you discuss any questions you have with your health care provider. Document Released: 10/21/2005 Document Revised: 06/08/2016 Document Reviewed: 06/08/2016 Elsevier Interactive Patient Education  2019 [place].   Potassium Content of Foods  Potassium is a mineral found in many foods and drinks. It affects how the heart works, and helps keep fluids and minerals balanced in the body. The amount of potassium you need each day depends on your age and any medical conditions you may have. Talk to your health care provider or dietitian about how much potassium you need. The following lists of foods provide the general serving size for foods and the approximate amount of  potassium in each serving, listed in milligrams (mg). Actual values may vary depending on the product and how it is processed. High in potassium The following foods and beverages have 200 mg or more of potassium per serving:  Apricots (raw) - 2 have 200 mg of  potassium.  Apricots (dry) - 5 have 200 mg of potassium.  Artichoke - 1 medium has 345 mg of potassium.  Avocado -  fruit has 245 mg of potassium.  Banana - 1 medium fruit has 425 mg of potassium.  [place] or baked beans (canned) -  cup has 280 mg of potassium.  White beans (canned) -  cup has 595 mg potassium.  Beef roast - 3 oz has 320 mg of potassium.  Ground beef - 3 oz has 270 mg of potassium.  Beets (raw or cooked) -  cup has 260 mg of potassium.  Bran muffin - 2 oz has 300 mg of potassium.  Broccoli (cooked) -  cup has 230 mg of potassium.  Brussels sprouts -  cup has 250 mg of potassium.  Cantaloupe -  cup has 215 mg of potassium.  Cereal, 100% bran -  cup has 200-400 mg of potassium.  Cheeseburger -1 single fast food burger has 225-400 mg of potassium.  Chicken - 3 oz has 220 mg of potassium.  Clams (canned) - 3 oz has 535 mg of potassium.  Crab - 3 oz has 225 mg of potassium.  Dates - 5 have 270 mg of potassium.  Dried beans and peas -  cup has 300-475 mg of potassium.  Figs (dried) - 2 have 260 mg of potassium.  Fish (halibut, tuna, cod, snapper) - 3 oz has 480 mg of potassium.  Fish (salmon, haddock, swordfish, perch) - 3 oz has 300 mg of potassium.  Fish (tuna, canned) - 3 oz has 200 mg of potassium.  [place] fries (fast food) - 3 oz has 470 mg of potassium.  Granola with fruit and nuts -  cup has 200 mg of potassium.  Grapefruit juice -  cup has 200 mg of potassium.  Honeydew melon -  cup has 200 mg of potassium.  Kale (raw) - 1 cup has 300 mg of potassium.  Kiwi - 1 medium fruit has 240 mg of potassium.  Kohlrabi, rutabaga, parsnips -  cup has 280 mg of potassium.  Lentils -  cup has 365 mg of potassium.  Mango - 1 each has 325 mg of potassium.  Milk (nonfat, low-fat, whole, buttermilk) - 1 cup has 350-380 mg of potassium.  Milk (chocolate) - 1 cup has 420 mg of potassium  Molasses - 1 Tbsp has 295 mg of  potassium.  Mushrooms -  cup has 280 mg of potassium.  Nectarine - 1 each has 275 mg of potassium.  Nuts (almonds, peanuts, hazelnuts, [place], cashew, mixed) - 1 oz has 200 mg of potassium.  Nuts (pistachios) - 1 oz has 295 mg of potassium.  Orange - 1 fruit has 240 mg of potassium.  Orange juice -  cup has 235 mg of potassium.  Papaya -  medium fruit has 390 mg of potassium.  Peanut butter (chunky) - 2 Tbsp has 240 mg of potassium.  Peanut butter (smooth) - 2 Tbsp has 210 mg of potassium.  Pear - 1 medium (200 mg) of potassium.  Pomegranate - 1 whole fruit has 400 mg of potassium.  Pomegranate juice -  cup has 215 mg of potassium.  Pork - 3 oz has 350  mg of potassium.  Potato chips (salted) - 1 oz has 465 mg of potassium.  Potato (baked with skin) - 1 medium has 925 mg of potassium.  Potato (boiled) -  cup has 255 mg of potassium.  Potato (Mashed) -  cup has 330 mg of potassium.  Prune juice -  cup has 370 mg of potassium.  Prunes - 5 have 305 mg of potassium.  Pudding (chocolate) -  cup has 230 mg of potassium.  Pumpkin (canned) -  cup has 250 mg of potassium.  Raisins (seedless) -  cup has 270 mg of potassium.  Seeds (sunflower or pumpkin) - 1 oz has 240 mg of potassium.  Soy milk - 1 cup has 300 mg of potassium.  Spinach (cooked) - 1/2 cup has 420 mg of potassium.  Spinach (canned) -  cup has 370 mg of potassium.  Sweet potato (baked with skin) - 1 medium has 450 mg of potassium.  Swiss chard -  cup has 480 mg of potassium.  Tomato or vegetable juice -  cup has 275 mg of potassium.  Tomato (sauce or puree) -  cup has 400-550 mg of potassium.  Tomato (raw) - 1 medium has 290 mg of potassium.  Tomato (canned) -  cup has 200-300 mg of potassium.  [place] - 3 oz has 250 mg of potassium.  Wheat germ - 1 oz has 250 mg of potassium.  Winter squash -  cup has 250 mg of potassium.  Yogurt (plain or fruited) - 6 oz has 260-435 mg of  potassium.  Zucchini -  cup has 220 mg of potassium. Moderate in potassium The following foods and beverages have 50-200 mg of potassium per serving:  Apple - 1 fruit has 150 mg of potassium  Apple juice -  cup has 150 mg of potassium  Applesauce -  cup has 90 mg of potassium  Apricot nectar -  cup has 140 mg of potassium  Asparagus (small spears) -  cup has 155 mg of potassium  Asparagus (large spears) - 6 have 155 mg of potassium  Bagel (cinnamon raisin) - 1 four-inch bagel has 130 mg of potassium  Bagel (egg or plain) - 1 four- inch bagel has 70 mg of potassium  Beans (green) -  cup has 90 mg of potassium  Beans (yellow) -  cup has 190 mg of potassium  Beer, regular - 12 oz has 100 mg of potassium  Beets (canned) -  cup has 125 mg of potassium  Blackberries -  cup has 115 mg of potassium  Blueberries -  cup has 60 mg of potassium  Bread (whole wheat) - 1 slice has 70 mg of potassium  Broccoli (raw) -  cup has 145 mg of potassium  Cabbage -  cup has 150 mg of potassium  Carrots (cooked or raw) -  cup has 180 mg of potassium  Cauliflower (raw) -  cup has 150 mg of potassium  Celery (raw) -  cup has 155 mg of potassium  Cereal, bran flakes -  cup has 120-150 mg of potassium  Cheese (cottage) -  cup has 110 mg of potassium  Cherries - 10 have 150 mg of potassium  Chocolate - 1 oz bar has 165 mg of potassium  Coffee (brewed) - 6 oz has 90 mg of potassium  Corn -  cup or 1 ear has 195 mg of potassium  Cucumbers -  cup has 80 mg of potassium  Egg - 1 large  egg has 60 mg of potassium  Eggplant -  cup has 60 mg of potassium  Endive (raw) -  cup has 80 mg of potassium  English muffin - 1 has 65 mg of potassium  Fish (ocean perch) - 3 oz has 192 mg of potassium  Frankfurter, beef or pork - 1 has 75 mg of potassium  Fruit cocktail -  cup has 115 mg of potassium  Grape juice -  cup has 170 mg of potassium  Grapefruit -  fruit  has 175 mg of potassium  Grapes -  cup has 155 mg of potassium  Greens: kale, turnip, collard -  cup has 110-150 mg of potassium  Ice cream or frozen yogurt (chocolate) -  cup has 175 mg of potassium  Ice cream or frozen yogurt (vanilla) -  cup has 120-150 mg of potassium  Lemons, limes - 1 each has 80 mg of potassium  Lettuce - 1 cup has 100 mg of potassium  Mixed vegetables -  cup has 150 mg of potassium  Mushrooms, raw -  cup has 110 mg of potassium  Nuts (walnuts, pecans, or macadamia) - 1 oz has 125 mg of potassium  Oatmeal -  cup has 80 mg of potassium  Okra -  cup has 110 mg of potassium  Onions -  cup has 120 mg of potassium  Peach - 1 has 185 mg of potassium  Peaches (canned) -  cup has 120 mg of potassium  Pears (canned) -  cup has 120 mg of potassium  Peas, green (frozen) -  cup has 90 mg of potassium  Peppers (Green) -  cup has 130 mg of potassium  Peppers (Red) -  cup has 160 mg of potassium  Pineapple juice -  cup has 165 mg of potassium  Pineapple (fresh or canned) -  cup has 100 mg of potassium  Plums - 1 has 105 mg of potassium  Pudding, vanilla -  cup has 150 mg of potassium  Raspberries -  cup has 90 mg of potassium  Rhubarb -  cup has 115 mg of potassium  Rice, wild -  cup has 80 mg of potassium  Shrimp - 3 oz has 155 mg of potassium  Spinach (raw) - 1 cup has 170 mg of potassium  Strawberries -  cup has 125 mg of potassium  Summer squash -  cup has 175-200 mg of potassium  Swiss chard (raw) - 1 cup has 135 mg of potassium  Tangerines - 1 fruit has 140 mg of potassium  Tea, brewed - 6 oz has 65 mg of potassium  Turnips -  cup has 140 mg of potassium  Watermelon -  cup has 85 mg of potassium  Wine (Red, table) - 5 oz has 180 mg of potassium  Wine (White, table) - 5 oz 100 mg of potassium Low in potassium The following foods and beverages have less than 50 mg of potassium per serving.  Bread (white)  - 1 slice has 30 mg of potassium  Carbonated beverages - 12 oz has less than 5 mg of potassium  Cheese - 1 oz has 20-30 mg of potassium  Cranberries -  cup has 45 mg of potassium  Cranberry juice cocktail -  cup has 20 mg of potassium  Fats and oils - 1 Tbsp has less than 5 mg of potassium  Hummus - 1 Tbsp has 32 mg of potassium  Nectar (papaya, mango, or pear) -  cup has  35 mg of potassium  Rice (white or brown) -  cup has 50 mg of potassium  Spaghetti or macaroni (cooked) -  cup has 30 mg of potassium  Tortilla, flour or corn - 1 has 50 mg of potassium  Waffle - 1 four-inch waffle has 50 mg of potassium  Water chestnuts -  cup has 40 mg of potassium Summary  Potassium is a mineral found in many foods and drinks. It affects how the heart works, and helps keep fluids and minerals balanced in the body.  The amount of potassium you need each day depends on your age and any existing medical conditions you may have. Your health care provider or dietitian may recommend an amount of potassium that you should have each day. This information is not intended to replace advice given to you by your health care provider. Make sure you discuss any questions you have with your health care provider. Document Released: 06/04/2005 Document Revised: 01/15/2017 Document Reviewed: 01/15/2017 Elsevier Interactive Patient Education  2019 [employer]   Pulmonary Embolism  A pulmonary embolism (PE) is a sudden blockage or decrease of blood flow in one lung or both lungs. Most blockages come from a blood clot that forms in a lower leg, thigh, or arm vein (deep vein thrombosis, DVT) and travels to the lungs. A clot is blood that has thickened into a gel or solid. PE is a dangerous and life-threatening condition that needs to be treated right away. What are the causes? This condition is usually caused by a blood clot that forms in a vein and moves to the lungs. In rare cases, it may be caused by  air, fat, part of a tumor, or other tissue that moves through the veins and into the lungs. What increases the risk? The following factors may make you more likely to develop this condition:  Traumatic injury, such as breaking a hip or leg.  Spinal cord injury.  Orthopedic surgery, especially hip or knee replacement.  Any major surgery.  Stroke.  Having DVT.  Blood clots or blood clotting disease.  Long-term (chronic) lung or heart disease.  Taking medicines that contain estrogen. These include birth control pills and hormone replacement therapy.  Cancer and chemotherapy.  Having a central venous catheter.  Pregnancy and the period of time after delivery (postpartum).  Being older than age 75.  Being overweight.  Smoking. What are the signs or symptoms? Symptoms of this condition usually start suddenly and include:  Shortness of breath during activity or at rest.  Coughing or coughing up blood or blood-tinged mucus.  Chest pain that is often worse with deep breaths.  Rapid or irregular heartbeat.  Feeling light-headed or dizzy.  Fainting.  Feeling anxious.  Fever.  Sweating.  Pain and swelling in a leg. This is a symptom of DVT, which can lead to PE. How is this diagnosed? This condition may be diagnosed based on:  Your medical history.  A physical exam.  Blood tests.  CT pulmonary angiogram. This test checks blood flow in and around your lungs.  Ventilation-perfusion scan, also called a lung VQ scan. This test measures air flow and blood flow to the lungs.  Ultrasound of the legs. How is this treated? Treatment for this condition depends on many factors, such as the cause of your PE, your risk for bleeding or developing more clots, and other medical conditions you have. Treatment aims to remove, dissolve, or stop blood clots from forming or growing larger. Treatment may  include:  Medicines, such as: ? Blood thinning medicines (anticoagulants)  to stop clots from forming or growing. ? Medicines that dissolve clots (thrombolytics).  Procedures, such as: ? Using a flexible tube to remove a blood clot (embolectomy) or deliver medicine to destroy it (catheter-directed thrombolysis). ? Inserting a filter into a large vein that carries blood to the heart (inferior vena cava). This filter (vena cava filter) catches blood clots before they reach the lungs. ? Surgery to remove the clot (surgical embolectomy). This is rare. You may need a combination of immediate, long-term (up to 3 months after diagnosis), and extended (more than 3 months after diagnosis) treatments. Your treatment may continue for several months (maintenance therapy). You and your health care provider will work together to choose the treatment program that is best for you. Follow these instructions at home: Medicines  Take over-the-counter and prescription medicines only as told by your health care provider.  If you are taking an anticoagulant medicine: ? Take the medicine every day at the same time each day. ? Understand what foods and drugs interact with your medicine. ? Understand the side effects of this medicine, including excessive bruising or bleeding. Ask your health care provider or pharmacist about other side effects. General instructions  Wear a medical alert bracelet or carry a medical alert card that says you have had a PE and lists what medicines you take.  Ask your health care provider when you may return to your normal activities. Avoid sitting or lying for a long time without moving.  Maintain a healthy weight. Ask your health care provider what weight is healthy for you.  Do not use any products that contain nicotine or tobacco, such as cigarettes and e-cigarettes. If you need help quitting, ask your health care provider.  Talk with your health care provider about any travel plans. It is important to make sure that you are still able to take your  medicine while on trips.  Keep all follow-up visits as told by your health care provider. This is important. Contact a health care provider if:  You missed a dose of your blood thinner medicine. Get help right away if:  You have: ? New or increased pain, swelling, warmth, or redness in an arm or leg. ? Numbness or tingling in an arm or leg. ? Shortness of breath during activity or at rest. ? A fever. ? Chest pain. ? A rapid or irregular heartbeat. ? A severe headache. ? Vision changes. ? A serious fall or accident, or you hit your head. ? Stomach (abdominal) pain. ? Blood in your vomit, stool, or urine. ? A cut that will not stop bleeding.  You cough up blood.  You feel light-headed or dizzy.  You cannot move your arms or legs.  You are confused or have memory loss. These symptoms may represent a serious problem that is an emergency. Do not wait to see if the symptoms will go away. Get medical help right away. Call your local emergency services (911 in the [place] Do not drive yourself to the hospital. Summary  A pulmonary embolism (PE) is a sudden blockage or decrease of blood flow in one lung or both lungs. PE is a dangerous and life-threatening condition that needs to be treated right away.  Treatments for this condition usually include medicines to thin your blood (anticoagulants) or medicines to break apart blood clots (thrombolytics).  If you are given blood thinners, it is important to take the medicine every single day  at the same time each day.  If you have signs of PE or DVT, call your local emergency services (911 in the [place] This information is not intended to replace advice given to you by your health care provider. Make sure you discuss any questions you have with your health care provider. Document Released: 10/18/2000 Document Revised: 06/05/2018 Document Reviewed: 12/04/2017 Elsevier Interactive Patient Education  2019 [employer].

## 2019-04-14 NOTE — Evaluation (Addendum)
Physical Therapy Evaluation and D/C Patient Details Name: Jason [name] MRN: [id number] DOB: [birth year] Today's Date: 04/14/2019   History of Present Illness   78 y.o. male with medical history significant of COPD, pulmonary nodule, and PE no on anticoagulation; who presents with complaints of progressively worsening shortness of breath since last night.  Patient reports that he moved from [place] in May or June of last year.  He had previous history of pulmonary embolus and had been on Xarelto up until August when his primary care provider took him off of that medication.   CT of the chest shows extensive pulmonary embolism moderate to severe in all lobes of the lung with right heart strain.    Clinical Impression  Pt admitted with above diagnosis. Pt currently without significant functional limitations and was able to ambulate without device with steady gait and O2 sats >91% on RA. DGI 24/24 with good balance reactions.  Will not need skilled PT at this time.  Will sign off.      Follow Up Recommendations No PT follow up    Equipment Recommendations  None recommended by PT    Recommendations for Other Services       Precautions / Restrictions Precautions Precautions: None Restrictions Weight Bearing Restrictions: No      Mobility  Bed Mobility Overal bed mobility: Independent                Transfers Overall transfer level: Independent                  Ambulation/Gait Ambulation/Gait assistance: Independent Gait Distance (Feet): 450 Feet Assistive device: None Gait Pattern/deviations: WFL(Within Functional Limits)   Gait velocity interpretation: >4.37 ft/sec, indicative of normal walking speed General Gait Details: NO LOB with gait walking in halls with challenges.  Pt actually walking at fast pace and cues to slow down. Sats 91% and > on RA>   Stairs            Wheelchair Mobility    Modified Rankin (Stroke Patients Only)       Balance                                  Standardized Balance Assessment Standardized Balance Assessment : Dynamic Gait Index   Dynamic Gait Index Level Surface: Normal Change in Gait Speed: Normal Gait with Horizontal Head Turns: Normal Gait with Vertical Head Turns: Normal Gait and Pivot Turn: Normal Step Over Obstacle: Normal Step Around Obstacles: Normal Steps: Normal Total Score: 24       Pertinent Vitals/Pain Pain Assessment: No/denies pain    Home Living Family/patient expects to be discharged to:: Private residence Living Arrangements: Alone   Type of Home: House Home Access: Level entry     Home Layout: One level Home Equipment: None      Prior Function Level of Independence: Independent               Hand Dominance        Extremity/Trunk Assessment   Upper Extremity Assessment Upper Extremity Assessment: Defer to OT evaluation    Lower Extremity Assessment Lower Extremity Assessment: Overall WFL for tasks assessed    Cervical / Trunk Assessment Cervical / Trunk Assessment: Normal  Communication   Communication: No difficulties  Cognition Arousal/Alertness: Awake/alert Behavior During Therapy: WFL for tasks assessed/performed Overall Cognitive Status: Within Functional Limits for tasks assessed  General Comments      Exercises     Assessment/Plan    PT Assessment Patent does not need any further PT services  PT Problem List         PT Treatment Interventions      PT Goals (Current goals can be found in the Care Plan section)  Acute Rehab PT Goals Patient Stated Goal: to go home PT Goal Formulation: All assessment and education complete, DC therapy    Frequency     Barriers to discharge        Co-evaluation               AM-PAC PT "6 Clicks" Mobility  Outcome Measure Help needed turning from your back to your side while in a flat bed without using bedrails?:  None Help needed moving from lying on your back to sitting on the side of a flat bed without using bedrails?: None Help needed moving to and from a bed to a chair (including a wheelchair)?: None Help needed standing up from a chair using your arms (e.g., wheelchair or bedside chair)?: None Help needed to walk in hospital room?: None Help needed climbing 3-5 steps with a railing? : None 6 Click Score: 24    End of Session Equipment Utilized During Treatment: Gait belt Activity Tolerance: Patient tolerated treatment well Patient left: in bed;with call bell/phone within reach Nurse Communication: Mobility status PT Visit Diagnosis: Muscle weakness (generalized) (M62.81)    Time: 6060-0459 PT Time Calculation (min) (ACUTE ONLY): 10 min   Charges:   PT Evaluation $PT Eval Low Complexity: [place] Pager:  [phone]  Office:  [place] 04/14/2019, 9:49 AM

## 2019-04-19 DIAGNOSIS — I2699 Other pulmonary embolism without acute cor pulmonale: Secondary | ICD-10-CM | POA: Diagnosis not present

## 2019-04-19 DIAGNOSIS — J439 Emphysema, unspecified: Secondary | ICD-10-CM | POA: Diagnosis not present

## 2019-04-19 DIAGNOSIS — M899 Disorder of bone, unspecified: Secondary | ICD-10-CM | POA: Diagnosis not present

## 2019-04-19 DIAGNOSIS — R911 Solitary pulmonary nodule: Secondary | ICD-10-CM | POA: Diagnosis not present

## 2019-04-19 DIAGNOSIS — E876 Hypokalemia: Secondary | ICD-10-CM | POA: Diagnosis not present

## 2019-04-19 DIAGNOSIS — I10 Essential (primary) hypertension: Secondary | ICD-10-CM | POA: Diagnosis not present

## 2019-05-05 ENCOUNTER — Ambulatory Visit: Payer: Medicare HMO | Admitting: Pulmonary Disease

## 2019-05-05 ENCOUNTER — Encounter: Payer: Self-pay | Admitting: Pulmonary Disease

## 2019-05-05 ENCOUNTER — Other Ambulatory Visit: Payer: Self-pay

## 2019-05-05 VITALS — BP 140/80 | HR 83 | Ht 70.0 in | Wt 191.0 lb

## 2019-05-05 DIAGNOSIS — I2609 Other pulmonary embolism with acute cor pulmonale: Secondary | ICD-10-CM

## 2019-05-05 DIAGNOSIS — J439 Emphysema, unspecified: Secondary | ICD-10-CM | POA: Diagnosis not present

## 2019-05-05 DIAGNOSIS — R911 Solitary pulmonary nodule: Secondary | ICD-10-CM

## 2019-05-05 DIAGNOSIS — Z87891 Personal history of nicotine dependence: Secondary | ICD-10-CM

## 2019-05-05 NOTE — Patient Instructions (Addendum)
Thank you for visiting Dr. Valeta Harms at [facility].  Today we recommend the following:  Orders Placed This Encounter  Procedures  . ECHOCARDIOGRAM COMPLETE  . Pulmonary Function Test   Continue spiriva. We will help with a medication assistance package.   Return in about 8 weeks (around 06/30/2019).

## 2019-05-05 NOTE — Progress Notes (Signed)
na

## 2019-05-05 NOTE — Progress Notes (Signed)
Synopsis: Referred in July 2020 for her nodules by Lujean Amel, MD  Subjective:   PATIENT ID: Jason [name] GENDER: male DOB: Mar 24, 1941, MRN: [id number]  Chief Complaint  Patient presents with  . Consult    Consult re: PE and SOB. He states he moved her from [place] a year ago and developed PE's and SOB. He is was weened off his Xarelto and started having SOB. He is currently back on the xarelto.     This is a 78 year old gentleman with a past medical history of COPD, pulmonary nodules, history of PE he was treated with Xarelto in the past up until August of this past year which was taken off by his primary care provider.  His mother has a history of blood clots at the age of 56 after having a stroke during that hospitalization.  He was admitted to the hospital on 04/12/2019 to the hospitalist service.  Patient was found to have acute hypoxemic respiratory failure secondary to submassive PE.  CT imaging revealed evidence of right ventricular strain.,  Troponin was 0.05.  COVID negative.  This is now his second pulmonary embolus and was explained during his hospitalization that he would need lifelong anticoagulation.  Patient was referred to pulmonary following his hospitalization for evaluation of a 1.5 cm pulmonary nodule as well as recurrent pulmonary embolus.  He is a former smoker who quit in April 2020.  Of note he does have regular alcohol use as well. Quit smoking cigarettes in 10 + years ago. He smoked most of his life since a teenager. He had a PFT or spirometry in the office in [place] but he doesn't know the results.   Today, he has been doing well.  He has no additional complaints.  He does admit to some dyspnea on exertion and excess fatigue when he goes to the driving range to hit a bucket of golf balls.  He does feel like this is a little bit better since his hospitalization.   Past Medical History:  Diagnosis Date  . COPD (chronic obstructive pulmonary disease) ([place])   .  Hypertension   . Pulmonary embolism (HCC)      Family History  Problem Relation Age of Onset  . CVA Mother   . Deep vein thrombosis Mother   . CVA Father      Past Surgical History:  Procedure Laterality Date  . NO PAST SURGERIES      Social History   Socioeconomic History  . Marital status: Single    Spouse name: Not on file  . Number of children: Not on file  . Years of education: Not on file  . Highest education level: Not on file  Occupational History  . Not on file  Social Needs  . Financial resource strain: Not on file  . Food insecurity    Worry: Not on file    Inability: Not on file  . Transportation needs    Medical: Not on file    Non-medical: Not on file  Tobacco Use  . Smoking status: Former Research scientist (life sciences)  . Smokeless tobacco: Never Used  Substance and Sexual Activity  . Alcohol use: Not Currently  . Drug use: Never  . Sexual activity: Not on file  Lifestyle  . Physical activity    Days per week: Not on file    Minutes per session: Not on file  . Stress: Not on file  Relationships  . Social connections    Talks on phone: Not on file  Gets together: Not on file    Attends religious service: Not on file    Active member of club or organization: Not on file    Attends meetings of clubs or organizations: Not on file    Relationship status: Not on file  . Intimate partner violence    Fear of current or ex partner: Not on file    Emotionally abused: Not on file    Physically abused: Not on file    Forced sexual activity: Not on file  Other Topics Concern  . Not on file  Social History Narrative  . Not on file     Not on File   Outpatient Medications Prior to Visit  Medication Sig Dispense Refill  . aspirin EC 81 MG tablet Take 1 tablet (81 mg total) by mouth daily for 30 days. 30 tablet 0  . lisinopril-hydrochlorothiazide (ZESTORETIC) 20-25 MG tablet Take 1 tablet by mouth daily.    . Multiple Vitamins-Minerals (MULTIVITAMIN WITH MINERALS)  tablet Take 1 tablet by mouth daily.    . Nutritional Supplements (JOINT FORMULA PO) Take 1 tablet by mouth daily.    . potassium chloride SA (K-DUR) 20 MEQ tablet Take 2 tablets (40 mEq total) by mouth daily. 7 tablet 0  . pravastatin (PRAVACHOL) 80 MG tablet Take 80 mg by mouth daily.    . Rivaroxaban 15 & 20 MG TBPK Take as directed on package: Start with one 15mg  tablet by mouth twice a day with food. On Day 22, switch to one 20mg  tablet once a day with food. 51 each 0  . SPIRIVA HANDIHALER 18 MCG inhalation capsule Place 1 capsule into inhaler and inhale daily.    . traZODone (DESYREL) 100 MG tablet Take 100 mg by mouth at bedtime.    . Vitamin D, Cholecalciferol, 50 MCG (2000 UT) CAPS Take 2,000 Units by mouth daily.     No facility-administered medications prior to visit.     Review of Systems  Constitutional: Negative for chills, fever, malaise/fatigue and weight loss.  HENT: Negative for hearing loss, sore throat and tinnitus.   Eyes: Negative for blurred vision and double vision.  Respiratory: Positive for shortness of breath. Negative for cough, hemoptysis, sputum production, wheezing and stridor.   Cardiovascular: Negative for chest pain, palpitations, orthopnea, leg swelling and PND.  Gastrointestinal: Negative for abdominal pain, constipation, diarrhea, heartburn, nausea and vomiting.  Genitourinary: Negative for dysuria, hematuria and urgency.  Musculoskeletal: Negative for joint pain and myalgias.  Skin: Negative for itching and rash.  Neurological: Negative for dizziness, tingling, weakness and headaches.  Endo/Heme/Allergies: Negative for environmental allergies. Does not bruise/bleed easily.  Psychiatric/Behavioral: Negative for depression. The patient is not nervous/anxious and does not have insomnia.   All other systems reviewed and are negative.    Objective:  Physical Exam Vitals signs reviewed.  Constitutional:      General: He is not in acute distress.     Appearance: He is well-developed.  HENT:     Head: Normocephalic and atraumatic.  Eyes:     General: No scleral icterus.    Conjunctiva/sclera: Conjunctivae normal.     Pupils: Pupils are equal, round, and reactive to light.  Neck:     Musculoskeletal: Neck supple.     Vascular: No JVD.     Trachea: No tracheal deviation.  Cardiovascular:     Rate and Rhythm: Normal rate and regular rhythm.     Heart sounds: No murmur.     Comments: S2 louder than  S1 Pulmonary:     Effort: Pulmonary effort is normal. No tachypnea, accessory muscle usage or respiratory distress.     Breath sounds: Normal breath sounds. No stridor. No wheezing, rhonchi or rales.  Abdominal:     General: Bowel sounds are normal. There is no distension.     Palpations: Abdomen is soft.     Tenderness: There is no abdominal tenderness.  Musculoskeletal:        General: No tenderness.  Lymphadenopathy:     Cervical: No cervical adenopathy.  Skin:    General: Skin is warm and dry.     Capillary Refill: Capillary refill takes less than 2 seconds.     Findings: No rash.  Neurological:     Mental Status: He is alert and oriented to person, place, and time.  Psychiatric:        Behavior: Behavior normal.      Vitals:   05/05/19 1139  BP: 140/80  Pulse: 83  SpO2: 96%  Weight: 191 lb (86.6 kg)  Height: 5\' 10"  (1.778 m)   96% on RA BMI Readings from Last 3 Encounters:  05/05/19 27.41 kg/m  04/12/19 27.20 kg/m   Wt Readings from Last 3 Encounters:  05/05/19 191 lb (86.6 kg)  04/12/19 189 lb 9.5 oz (86 kg)     CBC    Component Value Date/Time   WBC 6.4 04/14/2019 0545   RBC 4.65 04/14/2019 0545   HGB 15.5 04/14/2019 0545   HCT 45.2 04/14/2019 0545   PLT 176 04/14/2019 0545   MCV 97.2 04/14/2019 0545   MCH 33.3 04/14/2019 0545   MCHC 34.3 04/14/2019 0545   RDW 13.7 04/14/2019 0545   LYMPHSABS 1.2 04/14/2019 0545   MONOABS 0.7 04/14/2019 0545   EOSABS 0.2 04/14/2019 0545   BASOSABS 0.1  04/14/2019 0545    Chest Imaging:  04/12/2019 CTA chest: Moderate to severe burden of pulmonary embolus involving all pulmonary arterial branches.  RV/LV ratio 1.8 consistent with right ventricular strain.  Lingular nodule slightly enlarged from previous images.   The patient's images have been independently reviewed by me.    Pulmonary Functions Testing Results: No flowsheet data found.   FeNO: None   Pathology: None   Echocardiogram:   04/12/2019: IMPRESSIONS  1. The left ventricle has normal systolic function, with an ejection fraction of 55-60%. The cavity size was normal. There is mildly increased left ventricular wall thickness. Left ventricular diastolic Doppler parameters are consistent with impaired  relaxation. D-shaped interventricular septum suggestive of RV pressure/volume overload.  2. Trivial pericardial effusion is present.  3. There is mild mitral annular calcification present. No evidence of mitral valve stenosis. No significant mitral regurgitation.  4. The aortic valve is tricuspid. Mild calcification of the aortic valve. No stenosis of the aortic valve.  5. There is mild dilatation of the aortic root measuring 39 mm.  6. The right ventricle has moderately reduced systolic function. The cavity was moderately enlarged. There is no increase in right ventricular wall thickness.  04/12/2019: Vascular duplex: No evidence of DVT.  Heart Catheterization: None     Assessment & Plan:      ICD-10-CM   1. Pulmonary emphysema, unspecified emphysema type ([place])  J43.9 Pulmonary Function Test  2. Acute pulmonary embolism with acute cor pulmonale, unspecified pulmonary embolism type (HCC)  I26.09 ECHOCARDIOGRAM COMPLETE  3. Former smoker  Z87.891   4. Nodule of upper lobe of left lung  R91.1     Discussion:  This is  a 78 year old gentleman with a recent hospital admission for submassive PE with evidence of right ventricular strain on CT imaging and echocardiography.  This  is his second blood clot in his lifetime and is now on lifetime Xarelto.  He understands that he will need to remain on this.  He also needs follow-up regarding a lingular lung nodule.  Images within a rather short period of time show a decrease in size from 1.7 cm to 1.5 cm.  However evaluation with PET imaging will help [place] identify whether or not it is at high risk for malignancy.  He does have high risk history due to longstanding tobacco abuse.  Additionally he would need anticoagulation for his submassive PE before we would even consider a tissue sampling option.  We will schedule PET scan to be completed in 8 weeks as well.  He will also have follow-up echocardiogram to evaluate right ventricular function and resolution of elevated right-sided pressures.  He also needs full pulmonary function test to be completed for evaluation of COPD.  We also need to obtain records from his prior pulmonologist office in [place].  However the patient does not know the name of the office or the physician's name.  He is going to try to remember this and give [place] a call back.  In the meantime he can continue his Spiriva HandiHaler.  Greater than 50% of this patient 60-minute visit was spent face-to-face discussing above recommendations and treatment plan as outlined above reviewing CT images with the patient as well as echocardiogram results and the risk-benefit alternatives of proceeding with lifelong anticoagulation.   Current Outpatient Medications:  .  aspirin EC 81 MG tablet, Take 1 tablet (81 mg total) by mouth daily for 30 days., Disp: 30 tablet, Rfl: 0 .  lisinopril-hydrochlorothiazide (ZESTORETIC) 20-25 MG tablet, Take 1 tablet by mouth daily., Disp: , Rfl:  .  Multiple Vitamins-Minerals (MULTIVITAMIN WITH MINERALS) tablet, Take 1 tablet by mouth daily., Disp: , Rfl:  .  Nutritional Supplements (JOINT FORMULA PO), Take 1 tablet by mouth daily., Disp: , Rfl:  .  potassium chloride SA (K-DUR) 20 MEQ  tablet, Take 2 tablets (40 mEq total) by mouth daily., Disp: 7 tablet, Rfl: 0 .  pravastatin (PRAVACHOL) 80 MG tablet, Take 80 mg by mouth daily., Disp: , Rfl:  .  Rivaroxaban 15 & 20 MG TBPK, Take as directed on package: Start with one 15mg  tablet by mouth twice a day with food. On Day 22, switch to one 20mg  tablet once a day with food., Disp: 51 each, Rfl: 0 .  SPIRIVA HANDIHALER 18 MCG inhalation capsule, Place 1 capsule into inhaler and inhale daily., Disp: , Rfl:  .  traZODone (DESYREL) 100 MG tablet, Take 100 mg by mouth at bedtime., Disp: , Rfl:  .  Vitamin D, Cholecalciferol, 50 MCG (2000 UT) CAPS, Take 2,000 Units by mouth daily., Disp: , Rfl:    Garner Nash, DO [facility] 05/05/2019 11:56 AM

## 2019-05-17 ENCOUNTER — Telehealth: Payer: Self-pay

## 2019-05-17 NOTE — Telephone Encounter (Signed)
Received paper work stating he was denied approval for patient assistance. He can apply one year from the denial date if anything changes. Call made to patient, made aware he was denied and now he needs to call his insurance and find out what inhalers are on his formulary. Patient states once he finds out he will give [place] a call back. Will close this message for now. Nothing further is needed at this time.

## 2019-05-31 ENCOUNTER — Other Ambulatory Visit: Payer: Self-pay

## 2019-05-31 ENCOUNTER — Emergency Department (HOSPITAL_COMMUNITY)
Admission: EM | Admit: 2019-05-31 | Discharge: 2019-06-01 | Disposition: A | Discharge status: Home / Self Care | Payer: Medicare HMO | Attending: Emergency Medicine | Admitting: Emergency Medicine

## 2019-05-31 ENCOUNTER — Emergency Department (HOSPITAL_COMMUNITY): Payer: Medicare HMO

## 2019-05-31 DIAGNOSIS — Z87891 Personal history of nicotine dependence: Secondary | ICD-10-CM | POA: Insufficient documentation

## 2019-05-31 DIAGNOSIS — R251 Tremor, unspecified: Secondary | ICD-10-CM | POA: Insufficient documentation

## 2019-05-31 DIAGNOSIS — J449 Chronic obstructive pulmonary disease, unspecified: Secondary | ICD-10-CM | POA: Diagnosis not present

## 2019-05-31 DIAGNOSIS — G252 Other specified forms of tremor: Secondary | ICD-10-CM | POA: Diagnosis not present

## 2019-05-31 DIAGNOSIS — E860 Dehydration: Secondary | ICD-10-CM | POA: Insufficient documentation

## 2019-05-31 DIAGNOSIS — R5383 Other fatigue: Secondary | ICD-10-CM | POA: Diagnosis not present

## 2019-05-31 DIAGNOSIS — I10 Essential (primary) hypertension: Secondary | ICD-10-CM | POA: Diagnosis not present

## 2019-05-31 LAB — BASIC METABOLIC PANEL
Anion gap: 17 — ABNORMAL HIGH (ref 5–15)
BUN: 26 mg/dL — ABNORMAL HIGH (ref 8–23)
CO2: 22 mmol/L (ref 22–32)
Calcium: 9.5 mg/dL (ref 8.9–10.3)
Chloride: 95 mmol/L — ABNORMAL LOW (ref 98–111)
Creatinine, Ser: 1.55 mg/dL — ABNORMAL HIGH (ref 0.61–1.24)
GFR calc Af Amer: 49 mL/min — ABNORMAL LOW (ref >60)
GFR calc non Af Amer: 42 mL/min — ABNORMAL LOW (ref >60)
Glucose, Bld: 117 mg/dL — ABNORMAL HIGH (ref 70–99)
Potassium: 4 mmol/L (ref 3.5–5.1)
Sodium: 134 mmol/L — ABNORMAL LOW (ref 135–145)

## 2019-05-31 LAB — URINALYSIS, ROUTINE W REFLEX MICROSCOPIC
Glucose, UA: NEGATIVE mg/dL
Ketones, ur: 40 mg/dL — AB
Leukocytes,Ua: NEGATIVE
Nitrite: NEGATIVE
Protein, ur: 100 mg/dL — AB
Specific Gravity, Urine: >1.03 — ABNORMAL HIGH (ref 1.005–1.030)
pH: 5.5 (ref 5.0–8.0)

## 2019-05-31 LAB — CBC
HCT: 43.5 % (ref 39.0–52.0)
Hemoglobin: 15.4 g/dL (ref 13.0–17.0)
MCH: 34.1 pg — ABNORMAL HIGH (ref 26.0–34.0)
MCHC: 35.4 g/dL (ref 30.0–36.0)
MCV: 96.5 fL (ref 80.0–100.0)
Platelets: 182 10*3/uL (ref 150–400)
RBC: 4.51 MIL/uL (ref 4.22–5.81)
RDW: 13.3 % (ref 11.5–15.5)
WBC: 10.3 10*3/uL (ref 4.0–10.5)
nRBC: 0 % (ref 0.0–0.2)

## 2019-05-31 LAB — URINALYSIS, MICROSCOPIC (REFLEX)

## 2019-05-31 MED ORDER — SODIUM CHLORIDE 0.9% FLUSH: 3.0000 mL | Freq: Once | INTRAVENOUS | Status: DC

## 2019-05-31 NOTE — ED Triage Notes (Signed)
Per pt he has been heaving fatigue and weakness that started yesterday with the chills and shaking. Pt does not feel himself. He is having a low grade fevers at this time 99.5. No numbness or tingling in his extremeties

## 2019-06-01 ENCOUNTER — Other Ambulatory Visit: Payer: Self-pay

## 2019-06-01 MED ORDER — CHLORDIAZEPOXIDE HCL 5 MG PO CAPS: 5.0000 mg | ORAL_CAPSULE | Freq: Three times a day (TID) | ORAL | 0 refills | Status: DC | PRN

## 2019-06-01 MED ORDER — CHLORDIAZEPOXIDE HCL 5 MG PO CAPS
5.0000 mg | ORAL_CAPSULE | Freq: Once | ORAL | Status: AC
  Administered 2019-06-01: 5 mg via ORAL
  Filled 2019-06-01: qty 1

## 2019-06-01 NOTE — ED Provider Notes (Signed)
Emergency Department Provider Note   I have reviewed the triage vital signs and the nursing notes.   HISTORY  Chief Complaint Fatigue and Shaking   HPI Jason [name] is a 78 y.o. male with medical problems documented below who presents to the emergency department today for an episode of shaking.  Patient states that he went outside to walk his dog and had onset of bilateral arm shaking and abnormal feeling in the pit of the stomach.  Patient states that this continued for little while after he got back in the house and relaxed around 4:00 called his son who brought him here and around midnight he noticed that it improved.  He is basically at baseline at this time.  Patient does report a 48-month episode of daily alcohol use which he stopped on Friday.  States he is quit drinking alcohol the before he never had problems.  No trauma.  He is on blood thinners for PE has not missed any doses but has multiple bruises because of it.  No numbness tingling or weakness elsewhere.   No other associated or modifying symptoms.    Past Medical History:  Diagnosis Date  . COPD (chronic obstructive pulmonary disease) ([place])   . Hypertension   . Pulmonary embolism [facility])     Patient Active Problem List   Diagnosis Date Noted  . Pulmonary embolism ([place]) 04/13/2019  . Acute respiratory failure with hypoxia ([place])   . Pulmonary emboli ([place]) 04/12/2019  . COPD without exacerbation ([place]) 04/12/2019  . Elevated troponin 04/12/2019  . Pulmonary nodule 04/12/2019  . Tobacco abuse 04/12/2019  . Alcohol abuse 04/12/2019    Past Surgical History:  Procedure Laterality Date  . NO PAST SURGERIES      Current Outpatient Rx  . Order #: [id number] Class: Historical Med  . Order #: [id number] Class: Historical Med  . Order #: [id number] Class: Historical Med  . Order #: [id number] Class: Print  . Order #: [id number] Class: Historical Med  . Order #: [id number] Class: Historical Med  . Order #: [id number] Class:  Historical Med  . Order #: [id number] Class: Historical Med  . Order #: [id number] Class: Historical Med  . Order #: [id number] Class: Normal    Allergies Patient has no known allergies.  Family History  Problem Relation Age of Onset  . CVA Mother   . Deep vein thrombosis Mother   . CVA Father     Social History Social History   Tobacco Use  . Smoking status: Former Research scientist (life sciences)  . Smokeless tobacco: Never Used  Substance Use Topics  . Alcohol use: Not Currently  . Drug use: Never    Review of Systems  All other systems negative except as documented in the HPI. All pertinent positives and negatives as reviewed in the HPI. ____________________________________________   PHYSICAL EXAM:  VITAL SIGNS: ED Triage Vitals  Enc Vitals Group     BP 05/31/19 1710 (!) 155/93     Pulse Rate 05/31/19 1710 (!) 110     Resp 05/31/19 1710 16     Temp 05/31/19 1710 99.5 F (37.5 C)     Temp Source 05/31/19 1710 Oral     SpO2 05/31/19 1710 91 %    Constitutional: Alert and oriented. Well appearing and in no acute distress. Eyes: Conjunctivae are normal. PERRL. EOMI. Head: Atraumatic. Nose: No congestion/rhinnorhea. Mouth/Throat: Mucous membranes are moist.  Oropharynx non-erythematous. Neck: No stridor.  No meningeal signs.   Cardiovascular: tachycardic rate, regular rhythm. Good peripheral circulation. Grossly normal heart sounds.   Respiratory:  Normal respiratory effort.  No retractions. Lungs CTAB. Gastrointestinal: Soft and nontender. No distention.  Musculoskeletal: No lower extremity tenderness nor edema. No gross deformities of extremities. Neurologic:  Normal speech and language. No gross focal neurologic deficits are appreciated.  Mild resting tremor of both hands. Skin:  Skin is warm, dry and intact. No rash noted.  ____________________________________________   LABS (all labs ordered are listed, but only abnormal results are displayed)  Labs Reviewed  BASIC METABOLIC  PANEL - Abnormal; Notable for the following components:      Result Value   Sodium 134 (*)    Chloride 95 (*)    Glucose, Bld 117 (*)    BUN 26 (*)    Creatinine, Ser 1.55 (*)    GFR calc non Af Amer 42 (*)    GFR calc Af Amer 49 (*)    Anion gap 17 (*)    All other components within normal limits  CBC - Abnormal; Notable for the following components:   MCH 34.1 (*)    All other components within normal limits  URINALYSIS, ROUTINE W REFLEX MICROSCOPIC - Abnormal; Notable for the following components:   Specific Gravity, Urine >1.030 (*)    Hgb urine dipstick SMALL (*)    Bilirubin Urine MODERATE (*)    Ketones, ur 40 (*)    Protein, ur 100 (*)    All other components within normal limits  URINALYSIS, MICROSCOPIC (REFLEX) - Abnormal; Notable for the following components:   Bacteria, UA FEW (*)    All other components within normal limits  CBG MONITORING, ED   ____________________________________________  EKG   EKG Interpretation  Date/Time:  Tuesday June 01 2019 04:09:34 EDT Ventricular Rate:  88 PR Interval:    QRS Duration: 124 QT Interval:  369 QTC Calculation: 447 R Axis:   53 Text Interpretation:  Sinus rhythm Nonspecific intraventricular conduction delay improved ST changes form june 8 Confirmed by Merrily Pew [phone]) on 06/02/2019 1:56:11 AM       ____________________________________________  RADIOLOGY  No results found.  ____________________________________________   PROCEDURES  Procedure(s) performed:   Procedures   ____________________________________________   INITIAL IMPRESSION / ASSESSMENT AND PLAN / ED COURSE  Suspect combination of mild dehydration with alcohol withdrawal. Improved with PO fluids and librium. Will dc on same w/ follow up.      Pertinent labs & imaging results that were available during my care of the patient were reviewed by me and considered in my medical decision making (see chart for details).   A medical  screening exam was performed and I feel the patient has had an appropriate workup for their chief complaint at this time and likelihood of emergent condition existing is low. They have been counseled on decision, discharge, follow up and which symptoms necessitate immediate return to the emergency department. They or their family verbally stated understanding and agreement with plan and discharged in stable condition.   ____________________________________________  FINAL CLINICAL IMPRESSION(S) / ED DIAGNOSES  Final diagnoses:  Occasional tremors  Dehydration     MEDICATIONS GIVEN DURING THIS VISIT:  Medications  chlordiazePOXIDE (LIBRIUM) capsule 5 mg (5 mg Oral Given 06/01/19 0430)     NEW OUTPATIENT MEDICATIONS STARTED DURING THIS VISIT:  Discharge Medication List as of 06/01/2019  6:18 AM    START taking these medications   Details  chlordiazePOXIDE (LIBRIUM) 5 MG capsule Take 1 capsule (5 mg total) by mouth 3 (three) times daily as needed for anxiety (or tremors)., Starting Tue  06/01/2019, Normal        Note:  This note was prepared with assistance of Dragon voice recognition software. Occasional wrong-word or sound-a-like substitutions may have occurred due to the inherent limitations of voice recognition software.   Loxley Cibrian, Corene Cornea, MD 06/02/19 [phone]

## 2019-06-01 NOTE — ED Notes (Signed)
Pt upper body is tremulous

## 2019-06-01 NOTE — ED Notes (Signed)
Pt states that he is feeling much better.

## 2019-06-16 ENCOUNTER — Inpatient Hospital Stay (HOSPITAL_COMMUNITY)
Admission: EM | Admit: 2019-06-16 | Discharge: 2019-06-29 | DRG: 177 | Disposition: A | Discharge status: Skilled Nursing Facility | Payer: Medicare HMO | Attending: Family Medicine | Admitting: Family Medicine

## 2019-06-16 ENCOUNTER — Encounter (HOSPITAL_COMMUNITY): Payer: Self-pay

## 2019-06-16 ENCOUNTER — Other Ambulatory Visit: Payer: Self-pay

## 2019-06-16 ENCOUNTER — Emergency Department (HOSPITAL_COMMUNITY): Payer: Medicare HMO

## 2019-06-16 DIAGNOSIS — M6281 Muscle weakness (generalized): Secondary | ICD-10-CM | POA: Diagnosis not present

## 2019-06-16 DIAGNOSIS — Z7901 Long term (current) use of anticoagulants: Secondary | ICD-10-CM

## 2019-06-16 DIAGNOSIS — H9190 Unspecified hearing loss, unspecified ear: Secondary | ICD-10-CM | POA: Diagnosis present

## 2019-06-16 DIAGNOSIS — J168 Pneumonia due to other specified infectious organisms: Secondary | ICD-10-CM | POA: Diagnosis not present

## 2019-06-16 DIAGNOSIS — I2510 Atherosclerotic heart disease of native coronary artery without angina pectoris: Secondary | ICD-10-CM | POA: Diagnosis present

## 2019-06-16 DIAGNOSIS — Z720 Tobacco use: Secondary | ICD-10-CM | POA: Diagnosis not present

## 2019-06-16 DIAGNOSIS — R195 Other fecal abnormalities: Secondary | ICD-10-CM | POA: Diagnosis present

## 2019-06-16 DIAGNOSIS — R9431 Abnormal electrocardiogram [ECG] [EKG]: Secondary | ICD-10-CM | POA: Diagnosis not present

## 2019-06-16 DIAGNOSIS — J069 Acute upper respiratory infection, unspecified: Secondary | ICD-10-CM | POA: Diagnosis not present

## 2019-06-16 DIAGNOSIS — Z823 Family history of stroke: Secondary | ICD-10-CM

## 2019-06-16 DIAGNOSIS — J449 Chronic obstructive pulmonary disease, unspecified: Secondary | ICD-10-CM | POA: Diagnosis not present

## 2019-06-16 DIAGNOSIS — Z8719 Personal history of other diseases of the digestive system: Secondary | ICD-10-CM

## 2019-06-16 DIAGNOSIS — R498 Other voice and resonance disorders: Secondary | ICD-10-CM | POA: Diagnosis not present

## 2019-06-16 DIAGNOSIS — J984 Other disorders of lung: Secondary | ICD-10-CM | POA: Diagnosis not present

## 2019-06-16 DIAGNOSIS — J440 Chronic obstructive pulmonary disease with acute lower respiratory infection: Secondary | ICD-10-CM | POA: Diagnosis not present

## 2019-06-16 DIAGNOSIS — J9601 Acute respiratory failure with hypoxia: Secondary | ICD-10-CM | POA: Diagnosis not present

## 2019-06-16 DIAGNOSIS — J189 Pneumonia, unspecified organism: Secondary | ICD-10-CM | POA: Diagnosis present

## 2019-06-16 DIAGNOSIS — J9621 Acute and chronic respiratory failure with hypoxia: Secondary | ICD-10-CM | POA: Diagnosis present

## 2019-06-16 DIAGNOSIS — R918 Other nonspecific abnormal finding of lung field: Secondary | ICD-10-CM | POA: Diagnosis not present

## 2019-06-16 DIAGNOSIS — E785 Hyperlipidemia, unspecified: Secondary | ICD-10-CM | POA: Diagnosis not present

## 2019-06-16 DIAGNOSIS — J181 Lobar pneumonia, unspecified organism: Secondary | ICD-10-CM

## 2019-06-16 DIAGNOSIS — J1289 Other viral pneumonia: Secondary | ICD-10-CM | POA: Diagnosis not present

## 2019-06-16 DIAGNOSIS — F329 Major depressive disorder, single episode, unspecified: Secondary | ICD-10-CM | POA: Diagnosis present

## 2019-06-16 DIAGNOSIS — E876 Hypokalemia: Secondary | ICD-10-CM | POA: Diagnosis present

## 2019-06-16 DIAGNOSIS — Z66 Do not resuscitate: Secondary | ICD-10-CM | POA: Diagnosis not present

## 2019-06-16 DIAGNOSIS — R001 Bradycardia, unspecified: Secondary | ICD-10-CM | POA: Diagnosis not present

## 2019-06-16 DIAGNOSIS — Z86711 Personal history of pulmonary embolism: Secondary | ICD-10-CM | POA: Diagnosis present

## 2019-06-16 DIAGNOSIS — R2689 Other abnormalities of gait and mobility: Secondary | ICD-10-CM | POA: Diagnosis not present

## 2019-06-16 DIAGNOSIS — E7849 Other hyperlipidemia: Secondary | ICD-10-CM | POA: Diagnosis not present

## 2019-06-16 DIAGNOSIS — U071 COVID-19: Principal | ICD-10-CM | POA: Diagnosis present

## 2019-06-16 DIAGNOSIS — I2699 Other pulmonary embolism without acute cor pulmonale: Secondary | ICD-10-CM | POA: Diagnosis not present

## 2019-06-16 DIAGNOSIS — R740 Nonspecific elevation of levels of transaminase and lactic acid dehydrogenase [LDH]: Secondary | ICD-10-CM | POA: Diagnosis present

## 2019-06-16 DIAGNOSIS — I10 Essential (primary) hypertension: Secondary | ICD-10-CM | POA: Diagnosis present

## 2019-06-16 DIAGNOSIS — M7989 Other specified soft tissue disorders: Secondary | ICD-10-CM | POA: Diagnosis not present

## 2019-06-16 DIAGNOSIS — M2550 Pain in unspecified joint: Secondary | ICD-10-CM | POA: Diagnosis not present

## 2019-06-16 DIAGNOSIS — I2782 Chronic pulmonary embolism: Secondary | ICD-10-CM | POA: Diagnosis present

## 2019-06-16 DIAGNOSIS — Z8616 Personal history of COVID-19: Secondary | ICD-10-CM

## 2019-06-16 DIAGNOSIS — R278 Other lack of coordination: Secondary | ICD-10-CM | POA: Diagnosis not present

## 2019-06-16 DIAGNOSIS — R5381 Other malaise: Secondary | ICD-10-CM | POA: Diagnosis not present

## 2019-06-16 DIAGNOSIS — F39 Unspecified mood [affective] disorder: Secondary | ICD-10-CM | POA: Diagnosis not present

## 2019-06-16 DIAGNOSIS — I2721 Secondary pulmonary arterial hypertension: Secondary | ICD-10-CM | POA: Diagnosis not present

## 2019-06-16 DIAGNOSIS — Z7401 Bed confinement status: Secondary | ICD-10-CM | POA: Diagnosis not present

## 2019-06-16 DIAGNOSIS — R0602 Shortness of breath: Secondary | ICD-10-CM | POA: Diagnosis not present

## 2019-06-16 DIAGNOSIS — F419 Anxiety disorder, unspecified: Secondary | ICD-10-CM | POA: Diagnosis present

## 2019-06-16 DIAGNOSIS — R2681 Unsteadiness on feet: Secondary | ICD-10-CM | POA: Diagnosis not present

## 2019-06-16 DIAGNOSIS — Z87891 Personal history of nicotine dependence: Secondary | ICD-10-CM | POA: Diagnosis not present

## 2019-06-16 LAB — HEPATIC FUNCTION PANEL
ALT: 22 U/L (ref 0–44)
AST: 35 U/L (ref 15–41)
Albumin: 2.3 g/dL — ABNORMAL LOW (ref 3.5–5.0)
Alkaline Phosphatase: 47 U/L (ref 38–126)
Bilirubin, Direct: <0.1 mg/dL (ref 0.0–0.2)
Total Bilirubin: 0.8 mg/dL (ref 0.3–1.2)
Total Protein: 6.7 g/dL (ref 6.5–8.1)

## 2019-06-16 LAB — RESPIRATORY PANEL BY PCR

## 2019-06-16 LAB — CBC WITH DIFFERENTIAL/PLATELET
Abs Immature Granulocytes: 0.07 10*3/uL (ref 0.00–0.07)
Basophils Absolute: 0 10*3/uL (ref 0.0–0.1)
Basophils Relative: 0 %
Eosinophils Absolute: 0 10*3/uL (ref 0.0–0.5)
Eosinophils Relative: 0 %
HCT: 38.8 % — ABNORMAL LOW (ref 39.0–52.0)
Hemoglobin: 13 g/dL (ref 13.0–17.0)
Immature Granulocytes: 1 %
Lymphocytes Relative: 7 %
Lymphs Abs: 0.4 10*3/uL — ABNORMAL LOW (ref 0.7–4.0)
MCH: 33.5 pg (ref 26.0–34.0)
MCHC: 33.5 g/dL (ref 30.0–36.0)
MCV: 100 fL (ref 80.0–100.0)
Monocytes Absolute: 0.4 10*3/uL (ref 0.1–1.0)
Monocytes Relative: 6 %
Neutro Abs: 5 10*3/uL (ref 1.7–7.7)
Neutrophils Relative %: 86 %
Platelets: 385 10*3/uL (ref 150–400)
RBC: 3.88 MIL/uL — ABNORMAL LOW (ref 4.22–5.81)
RDW: 13.2 % (ref 11.5–15.5)
WBC: 5.8 10*3/uL (ref 4.0–10.5)
nRBC: 0 % (ref 0.0–0.2)

## 2019-06-16 LAB — BASIC METABOLIC PANEL
Anion gap: 16 — ABNORMAL HIGH (ref 5–15)
BUN: 30 mg/dL — ABNORMAL HIGH (ref 8–23)
CO2: 20 mmol/L — ABNORMAL LOW (ref 22–32)
Calcium: 8.5 mg/dL — ABNORMAL LOW (ref 8.9–10.3)
Chloride: 96 mmol/L — ABNORMAL LOW (ref 98–111)
Creatinine, Ser: 1.28 mg/dL — ABNORMAL HIGH (ref 0.61–1.24)
GFR calc Af Amer: >60 mL/min (ref >60)
GFR calc non Af Amer: 53 mL/min — ABNORMAL LOW (ref >60)
Glucose, Bld: 137 mg/dL — ABNORMAL HIGH (ref 70–99)
Potassium: 3.5 mmol/L (ref 3.5–5.1)
Sodium: 132 mmol/L — ABNORMAL LOW (ref 135–145)

## 2019-06-16 LAB — BRAIN NATRIURETIC PEPTIDE: B Natriuretic Peptide: 82.3 pg/mL (ref 0.0–100.0)

## 2019-06-16 LAB — D-DIMER, QUANTITATIVE: D-Dimer, Quant: 1.62 ug/mL-FEU — ABNORMAL HIGH (ref 0.00–0.50)

## 2019-06-16 LAB — ABO/RH: ABO/RH(D): A POS

## 2019-06-16 LAB — TROPONIN I (HIGH SENSITIVITY)
Troponin I (High Sensitivity): 6 ng/L (ref <18)
Troponin I (High Sensitivity): 6 ng/L (ref <18)

## 2019-06-16 LAB — C-REACTIVE PROTEIN: CRP: 19 mg/dL — ABNORMAL HIGH (ref <1.0)

## 2019-06-16 LAB — PROCALCITONIN: Procalcitonin: <0.1 ng/mL

## 2019-06-16 LAB — LACTATE DEHYDROGENASE: LDH: 303 U/L — ABNORMAL HIGH (ref 98–192)

## 2019-06-16 LAB — FERRITIN: Ferritin: 1078 ng/mL — ABNORMAL HIGH (ref 24–336)

## 2019-06-16 LAB — LACTIC ACID, PLASMA: Lactic Acid, Venous: 1.7 mmol/L (ref 0.5–1.9)

## 2019-06-16 LAB — SARS CORONAVIRUS 2 BY RT PCR (HOSPITAL ORDER, PERFORMED IN ~~LOC~~ HOSPITAL LAB): SARS Coronavirus 2: POSITIVE — AB

## 2019-06-16 MED ORDER — CHLORDIAZEPOXIDE HCL 5 MG PO CAPS
5.0000 mg | ORAL_CAPSULE | Freq: Three times a day (TID) | ORAL | Status: DC | PRN
  Administered 2019-06-17 – 2019-06-18 (×2): 5 mg via ORAL
  Filled 2019-06-16 (×2): qty 1

## 2019-06-16 MED ORDER — SODIUM CHLORIDE 0.9 % IV SOLN
2.0000 g | Freq: Once | INTRAVENOUS | Status: AC
  Administered 2019-06-16: 15:00:00 2 g via INTRAVENOUS
  Filled 2019-06-16: qty 2

## 2019-06-16 MED ORDER — METHYLPREDNISOLONE SODIUM SUCC 125 MG IJ SOLR
60.0000 mg | Freq: Four times a day (QID) | INTRAMUSCULAR | Status: DC
  Administered 2019-06-16 – 2019-06-18 (×6): 60 mg via INTRAVENOUS
  Filled 2019-06-16 (×6): qty 2

## 2019-06-16 MED ORDER — VITAMIN D 25 MCG (1000 UNIT) PO TABS
2000.0000 [IU] | ORAL_TABLET | Freq: Every day | ORAL | Status: DC
  Administered 2019-06-17 – 2019-06-29 (×13): 2000 [IU] via ORAL
  Filled 2019-06-16 (×13): qty 2

## 2019-06-16 MED ORDER — PRAVASTATIN SODIUM 40 MG PO TABS
80.0000 mg | ORAL_TABLET | Freq: Every day | ORAL | Status: DC
  Administered 2019-06-16 – 2019-06-28 (×13): 80 mg via ORAL
  Filled 2019-06-16 (×13): qty 2

## 2019-06-16 MED ORDER — TIOTROPIUM BROMIDE MONOHYDRATE 18 MCG IN CAPS: 1.0000 | ORAL_CAPSULE | Freq: Every day | RESPIRATORY_TRACT | Status: DC

## 2019-06-16 MED ORDER — SODIUM CHLORIDE 0.9 % IV SOLN
200.0000 mg | Freq: Once | INTRAVENOUS | Status: AC
  Administered 2019-06-16: 200 mg via INTRAVENOUS
  Filled 2019-06-16: qty 40

## 2019-06-16 MED ORDER — SODIUM CHLORIDE 0.9 % IV SOLN
100.0000 mg | INTRAVENOUS | Status: AC
  Administered 2019-06-17 – 2019-06-20 (×4): 100 mg via INTRAVENOUS
  Filled 2019-06-16 (×5): qty 20

## 2019-06-16 MED ORDER — IPRATROPIUM-ALBUTEROL 0.5-2.5 (3) MG/3ML IN SOLN: 3.0000 mL | Freq: Four times a day (QID) | RESPIRATORY_TRACT | Status: DC

## 2019-06-16 MED ORDER — RIVAROXABAN 20 MG PO TABS
20.0000 mg | ORAL_TABLET | Freq: Every day | ORAL | Status: DC
  Administered 2019-06-17 – 2019-06-28 (×12): 20 mg via ORAL
  Filled 2019-06-16 (×14): qty 1

## 2019-06-16 MED ORDER — ADULT MULTIVITAMIN W/MINERALS CH
1.0000 | ORAL_TABLET | Freq: Every day | ORAL | Status: DC
  Administered 2019-06-17 – 2019-06-29 (×13): 1 via ORAL
  Filled 2019-06-16 (×13): qty 1

## 2019-06-16 MED ORDER — TRAZODONE HCL 50 MG PO TABS
100.0000 mg | ORAL_TABLET | Freq: Every day | ORAL | Status: DC
  Administered 2019-06-16 – 2019-06-28 (×13): 100 mg via ORAL
  Filled 2019-06-16: qty 2
  Filled 2019-06-16: qty 1
  Filled 2019-06-16 (×11): qty 2

## 2019-06-16 MED ORDER — VANCOMYCIN HCL 10 G IV SOLR
1500.0000 mg | Freq: Once | INTRAVENOUS | Status: AC
  Administered 2019-06-16: 16:00:00 1500 mg via INTRAVENOUS
  Filled 2019-06-16: qty 1500

## 2019-06-16 NOTE — ED Triage Notes (Signed)
Pt arrives to triage room with rapid respirations pt reports sob that has been worsening for 1 week. Pts room air sat 88%- pt taken to treatment room.

## 2019-06-16 NOTE — ED Notes (Signed)
ED TO INPATIENT HANDOFF REPORT  ED Nurse Name and Phone #: [phone] Darci Current Name/Age/Gender Jason [name] 78 y.o. male Room/Bed: 419Q/222L  Code Status   Code Status: Prior  Home/SNF/Other Home Patient oriented to: self, place, time and situation Is this baseline? Yes   Triage Complete: Triage complete  Chief Complaint SHOB, tired, diarrhea  Triage Note Pt arrives to triage room with rapid respirations pt reports sob that has been worsening for 1 week. Pts room air sat 88%- pt taken to treatment room.    Allergies No Known Allergies  Level of Care/Admitting Diagnosis ED Disposition    ED Disposition Condition Comment   Admit  Hospital Area: [place] [[id number]]  Level of Care: Telemetry Medical [104]  Covid Evaluation: Confirmed COVID Positive  Diagnosis: Bilateral pneumonia [798921]  Admitting Physician: Merton Border [[id number]]  Attending Physician: Laren Everts, ALI Marshal.Browner  Estimated length of stay: past midnight tomorrow  Certification:: I certify this patient will need inpatient services for at least 2 midnights  PT Class (Do Not Modify): Inpatient [101]  PT Acc Code (Do Not Modify): Private [1]       B Medical/Surgery History Past Medical History:  Diagnosis Date  . COPD (chronic obstructive pulmonary disease) ([place])   . Hypertension   . Pulmonary embolism [facility])    Past Surgical History:  Procedure Laterality Date  . NO PAST SURGERIES       A IV Location/Drains/Wounds Patient Lines/Drains/Airways Status   Active Line/Drains/Airways    Name:   Placement date:   Placement time:   Site:   Days:   Peripheral IV 06/16/19 Left Antecubital   06/16/19    1521    Antecubital   less than 1          Intake/Output Last 24 hours  Intake/Output Summary (Last 24 hours) at 06/16/2019 2028 Last data filed at 06/16/2019 1939 Gross per 24 hour  Intake 600 ml  Output -  Net 600 ml    Labs/Imaging Results for orders placed or performed during the  hospital encounter of 06/16/19 (from the past 48 hour(s))  CBC with Differential     Status: Abnormal   Collection Time: 06/16/19  1:01 PM  Result Value Ref Range   WBC 5.8 4.0 - 10.5 K/uL   RBC 3.88 (L) 4.22 - 5.81 MIL/uL   Hemoglobin 13.0 13.0 - 17.0 g/dL   HCT 38.8 (L) 39.0 - 52.0 %   MCV 100.0 80.0 - 100.0 fL   MCH 33.5 26.0 - 34.0 pg   MCHC 33.5 30.0 - 36.0 g/dL   RDW 13.2 11.5 - 15.5 %   Platelets 385 150 - 400 K/uL   nRBC 0.0 0.0 - 0.2 %   Neutrophils Relative % 86 %   Neutro Abs 5.0 1.7 - 7.7 K/uL   Lymphocytes Relative 7 %   Lymphs Abs 0.4 (L) 0.7 - 4.0 K/uL   Monocytes Relative 6 %   Monocytes Absolute 0.4 0.1 - 1.0 K/uL   Eosinophils Relative 0 %   Eosinophils Absolute 0.0 0.0 - 0.5 K/uL   Basophils Relative 0 %   Basophils Absolute 0.0 0.0 - 0.1 K/uL   Immature Granulocytes 1 %   Abs Immature Granulocytes 0.07 0.00 - 0.07 K/uL    Comment: Performed at [facility], [address] [place], [place] [address]  Basic metabolic panel     Status: Abnormal   Collection Time: 06/16/19  1:01 PM  Result Value Ref  Range   Sodium 132 (L) 135 - 145 mmol/L   Potassium 3.5 3.5 - 5.1 mmol/L   Chloride 96 (L) 98 - 111 mmol/L   CO2 20 (L) 22 - 32 mmol/L   Glucose, Bld 137 (H) 70 - 99 mg/dL   BUN 30 (H) 8 - 23 mg/dL   Creatinine, Ser 1.28 (H) 0.61 - 1.24 mg/dL   Calcium 8.5 (L) 8.9 - 10.3 mg/dL   GFR calc non Af Amer 53 (L) >60 mL/min   GFR calc Af Amer >60 >60 mL/min   Anion gap 16 (H) 5 - 15    Comment: Performed at [facility] [address] [place], [place] [address]  Troponin I (High Sensitivity)     Status: None   Collection Time: 06/16/19  1:01 PM  Result Value Ref Range   Troponin I (High Sensitivity) 6 <18 ng/L    Comment: (NOTE) Elevated high sensitivity troponin I (hsTnI) values and significant  changes across serial measurements may suggest ACS but many other  chronic and acute conditions are known to elevate hsTnI results.  Refer to the "Links"  section for chest pain algorithms and additional  guidance. Performed at [facility], [place] [address], [place], [place] [address]   SARS Coronavirus 2 [facility] order, Performed in [facility] hospital lab) Nasopharyngeal Nasopharyngeal Swab     Status: Abnormal   Collection Time: 06/16/19  1:20 PM   Specimen: Nasopharyngeal Swab  Result Value Ref Range   SARS Coronavirus 2 POSITIVE (A) NEGATIVE    Comment: RESULT CALLED TO, READ BACK BY AND VERIFIED WITH: Claretta Fraise RN 15:35 06/16/19 ([email or web address]) (NOTE) If result is NEGATIVE SARS-CoV-2 target nucleic acids are NOT DETECTED. The SARS-CoV-2 RNA is generally detectable in upper and lower  respiratory specimens during the acute phase of infection. The lowest  concentration of SARS-CoV-2 viral copies this assay can detect is 250  copies / mL. A negative result does not preclude SARS-CoV-2 infection  and should not be used as the sole basis for treatment or other  patient management decisions.  A negative result may occur with  improper specimen collection / handling, submission of specimen other  than nasopharyngeal swab, presence of viral mutation(s) within the  areas targeted by this assay, and inadequate number of viral copies  (<250 copies / mL). A negative result must be combined with clinical  observations, patient history, and epidemiological information. If result is POSITIVE SARS-CoV-2 target nucleic acids are DETECTED.  The SARS-CoV-2 RNA is generally detectable in upper and lower  respiratory specimens during the acute phase of infection.  Positive  results are indicative of active infection with SARS-CoV-2.  Clinical  correlation with patient history and other diagnostic information is  necessary to determine patient infection status.  Positive results do  not rule out bacterial infection or co-infection with other viruses. If result is PRESUMPTIVE POSTIVE SARS-CoV-2 nucleic acids MAY BE PRESENT.   A presumptive  positive result was obtained on the submitted specimen  and confirmed on repeat testing.  While 2019 novel coronavirus  (SARS-CoV-2) nucleic acids may be present in the submitted sample  additional confirmatory testing may be necessary for epidemiological  and / or clinical management purposes  to differentiate between  SARS-CoV-2 and other Sarbecovirus currently known to infect humans.  If clinically indicated additional testing with an alternate test  methodology [phone])  is advised. The SARS-CoV-2 RNA is generally  detectable in upper and lower respiratory specimens during the  acute  phase of infection. The expected result is Negative. Fact Sheet for Patients:  StrictlyIdeas.no Fact Sheet for Healthcare Providers: BankingDealers.co.za This test is not yet approved or cleared by the [place] FDA and has been authorized for detection and/or diagnosis of SARS-CoV-2 by FDA under an Emergency Use Authorization (EUA).  This EUA will remain in effect (meaning this test can be used) for the duration of the COVID-19 declaration under Section 564(b)(1) of the Act, 21 U.S.C. section 360bbb-3(b)(1), unless the authorization is terminated or revoked sooner. Performed at [facility], [place] [address] [place], [place] [address]   Lactic acid, plasma     Status: None   Collection Time: 06/16/19  3:15 PM  Result Value Ref Range   Lactic Acid, Venous 1.7 0.5 - 1.9 mmol/L    Comment: Performed at [facility] [address], [place], [place] [address]  Lactate dehydrogenase     Status: Abnormal   Collection Time: 06/16/19  4:30 PM  Result Value Ref Range   LDH 303 (H) 98 - 192 U/L    Comment: Performed at [facility] [address], [place], [place] [address]  Procalcitonin - Baseline     Status: None   Collection Time: 06/16/19  4:30 PM  Result Value Ref Range   Procalcitonin <0.10 ng/mL    Comment:         Interpretation: PCT (Procalcitonin) <= 0.5 ng/mL: Systemic infection (sepsis) is not likely. Local bacterial infection is possible. (NOTE)       Sepsis PCT Algorithm           Lower Respiratory Tract                                      Infection PCT Algorithm    ----------------------------     ----------------------------         PCT < 0.25 ng/mL                PCT < 0.10 ng/mL         Strongly encourage             Strongly discourage   discontinuation of antibiotics    initiation of antibiotics    ----------------------------     -----------------------------       PCT 0.25 - 0.50 ng/mL            PCT 0.10 - 0.25 ng/mL               OR       >80% decrease in PCT            Discourage initiation of                                            antibiotics      Encourage discontinuation           of antibiotics    ----------------------------     -----------------------------         PCT >= 0.50 ng/mL              PCT 0.26 - 0.50 ng/mL               AND        <80% decrease in  PCT             Encourage initiation of                                             antibiotics       Encourage continuation           of antibiotics    ----------------------------     -----------------------------        PCT >= 0.50 ng/mL                  PCT > 0.50 ng/mL               AND         increase in PCT                  Strongly encourage                                      initiation of antibiotics    Strongly encourage escalation           of antibiotics                                     -----------------------------                                           PCT <= 0.25 ng/mL                                                 OR                                        > 80% decrease in PCT                                     Discontinue / Do not initiate                                             antibiotics Performed at [facility], [place] [address] [place], [place] [address]    Hepatic function panel     Status: Abnormal   Collection Time: 06/16/19  4:30 PM  Result Value Ref Range   Total Protein 6.7 6.5 - 8.1 g/dL   Albumin 2.3 (L) 3.5 - 5.0 g/dL   AST 35 15 - 41 U/L   ALT 22 0 - 44 U/L   Alkaline Phosphatase 47 38 - 126 U/L   Total Bilirubin 0.8 0.3 - 1.2 mg/dL   Bilirubin, Direct <0.1 0.0 - 0.2 mg/dL   Indirect Bilirubin NOT CALCULATED 0.3 - 0.9 mg/dL    Comment:  Performed at [facility], [place] [address] [place], [place] [address]  Brain natriuretic peptide     Status: None   Collection Time: 06/16/19  4:31 PM  Result Value Ref Range   B Natriuretic Peptide 82.3 0.0 - 100.0 pg/mL    Comment: Performed at [facility] [address] [place], [place] [address]  Troponin I (High Sensitivity)     Status: None   Collection Time: 06/16/19  5:05 PM  Result Value Ref Range   Troponin I (High Sensitivity) 6 <18 ng/L    Comment: Performed at [facility] [address] [place], [place] [address]  C-reactive protein     Status: Abnormal   Collection Time: 06/16/19  5:05 PM  Result Value Ref Range   CRP 19.0 (H) <1.0 mg/dL    Comment: Performed at [facility], [place] [address] [place], [place] [address]  D-dimer, quantitative (not at [facility])     Status: Abnormal   Collection Time: 06/16/19  5:05 PM  Result Value Ref Range   D-Dimer, Quant 1.62 (H) 0.00 - 0.50 ug/mL-FEU    Comment: (NOTE) At the manufacturer cut-off of 0.50 ug/mL FEU, this assay has been documented to exclude PE with a sensitivity and negative predictive value of 97 to 99%.  At this time, this assay has not been approved by the FDA to exclude DVT/VTE. Results should be correlated with clinical presentation. Performed at [facility], [place] [address] [place], [place] [address]   Ferritin     Status: Abnormal   Collection Time: 06/16/19  5:05 PM  Result Value Ref Range   Ferritin 1,078 (H) 24 - 336 ng/mL    Comment: Performed at [facility],  [place] [address] [place], [place] [address]   Dg Chest Portable 1 View  Result Date: 06/16/2019 CLINICAL DATA:  Shortness of breath and hypoxia EXAM: PORTABLE CHEST 1 VIEW COMPARISON:  May 31, 2019 FINDINGS: The mediastinal contour is normal. Heart size is enlarged. Patchy consolidation is identified in the left lung base and throughout the right lung. There is no pleural effusion or pulmonary edema. No acute abnormalities identified in the visualized bony structures. IMPRESSION: Bilateral pneumonias. Electronically Signed   By: Abelardo Diesel M.D.   On: 06/16/2019 13:51    Pending Labs Unresulted Labs (From admission, onward)    Start     Ordered   06/17/19 0500  Procalcitonin  Daily,   R     06/16/19 1622   06/16/19 1631  C-reactive protein  Daily,   R     06/16/19 1630   06/16/19 1631  D-dimer, quantitative (not at [facility])  Daily,   R     06/16/19 1630   06/16/19 1631  Ferritin  Daily,   R     06/16/19 1630   06/16/19 1411  Culture, blood (routine x 2)  BLOOD CULTURE X 2,   STAT     06/16/19 1410   Signed and Held  SARS Coronavirus 2 [facility] order, Performed in [facility] hospital lab) Nasopharyngeal Nasopharyngeal Swab  (Novel Coronavirus, [facility] Order))  Once,   R    Question Answer Comment  Is this test for diagnosis or screening Diagnosis of ill patient   Symptomatic for COVID-19 as defined by CDC Yes   Date of Symptom Onset 06/09/2019   Hospitalized for COVID-19 Yes   Admitted to ICU for COVID-19 No   Previously tested for COVID-19  Yes   Resident in a congregate (group) care setting No   Employed in healthcare setting No      Signed and Held   Signed and Held  ABO/Rh  Once,   R     Signed and Held   Signed and Held  CK  Daily,   R     Signed and Held   Signed and Held  Respiratory Panel by PCR  Add-on,   R     Signed and Held   Signed and Held  Interleukin-6, Plasma  Daily,   R     Signed and Held   Signed and Held  Magnesium  Daily,   R     Signed and Held   Signed  and Held  Phosphorus  Daily,   R     Signed and Held   Signed and Held  Triglycerides  Daily,   R     Signed and Held          Vitals/Pain Today's Vitals   06/16/19 1445 06/16/19 1500 06/16/19 1515 06/16/19 1534  BP: 130/68 130/71 121/60   Pulse: 81     Resp: (!) 28 (!) 21 (!) 35   Temp:      TempSrc:      SpO2: 94%     Weight:      Height:      PainSc:    0-No pain    Isolation Precautions Airborne and Contact precautions  Medications Medications  methylPREDNISolone sodium succinate (SOLU-MEDROL) 125 mg/2 mL injection 60 mg (60 mg Intravenous Given 06/16/19 1935)  remdesivir 200 mg in sodium chloride 0.9 % 250 mL IVPB (has no administration in time range)    Followed by  remdesivir 100 mg in sodium chloride 0.9 % 250 mL IVPB (has no administration in time range)  ceFEPIme (MAXIPIME) 2 g in sodium chloride 0.9 % 100 mL IVPB (0 g Intravenous Stopped 06/16/19 1710)  vancomycin (VANCOCIN) 1,500 mg in sodium chloride 0.9 % 500 mL IVPB (0 mg Intravenous Stopped 06/16/19 1939)    Mobility walks with person assist Low fall risk   Focused Assessments Pulmonary Assessment Handoff:  Lung sounds:            R Recommendations: See Admitting Provider Note  Report given to:   Additional Notes:

## 2019-06-16 NOTE — ED Provider Notes (Signed)
[facility] EMERGENCY DEPARTMENT Provider Note   CSN: [id number] Arrival date & time: 06/16/19  1218    History   Chief Complaint Chief Complaint  Patient presents with  . Shortness of Breath    HPI Dravon Nott is a 78 y.o. male.     The history is provided by the patient and medical records. No language interpreter was used.  Shortness of Breath Associated symptoms: chest pain   Associated symptoms: no cough and no wheezing    Raiquan Yeatman is a 78 y.o. male  with a PMH of COPD, HTN, recent PE currently on Xarelto who presents to the Emergency Department complaining of gradually worsening shortness of breath over the last week.  He reports feeling very tired.  Denies any known fever or chills.  Denies any abdominal pain, nausea or vomiting.  He reports right sided and central chest pain only with deep breathing.  No exertional chest pain.  He has been compliant with his anticoagulation without any missed doses.  No back pain.  He is not on oxygen at home.  Past Medical History:  Diagnosis Date  . COPD (chronic obstructive pulmonary disease) ([place])   . Hypertension   . Pulmonary embolism [facility])     Patient Active Problem List   Diagnosis Date Noted  . Pulmonary embolism ([place]) 04/13/2019  . Acute respiratory failure with hypoxia ([place])   . Pulmonary emboli ([place]) 04/12/2019  . COPD without exacerbation ([place]) 04/12/2019  . Elevated troponin 04/12/2019  . Pulmonary nodule 04/12/2019  . Tobacco abuse 04/12/2019  . Alcohol abuse 04/12/2019    Past Surgical History:  Procedure Laterality Date  . NO PAST SURGERIES          Home Medications    Prior to Admission medications   Medication Sig Start Date End Date Taking? Authorizing Provider  chlordiazePOXIDE (LIBRIUM) 5 MG capsule Take 1 capsule (5 mg total) by mouth 3 (three) times daily as needed for anxiety (or tremors). 06/01/19   Mesner, Corene Cornea, MD  lisinopril-hydrochlorothiazide (ZESTORETIC) 20-25 MG  tablet Take 1 tablet by mouth daily. 04/08/19   [provider]  Multiple Vitamins-Minerals (MULTIVITAMIN WITH MINERALS) tablet Take 1 tablet by mouth daily.    [provider]  Nutritional Supplements (JOINT FORMULA PO) Take 1 tablet by mouth daily.    [provider]  potassium chloride SA (K-DUR) 20 MEQ tablet Take 2 tablets (40 mEq total) by mouth daily. 04/14/19   Kayleen Memos, DO  pravastatin (PRAVACHOL) 80 MG tablet Take 80 mg by mouth daily. 04/07/19   [provider]  rivaroxaban (XARELTO) 20 MG TABS tablet Take 20 mg by mouth daily.    [provider]  SPIRIVA HANDIHALER 18 MCG inhalation capsule Place 1 capsule into inhaler and inhale daily. 12/15/18   [provider]  traZODone (DESYREL) 100 MG tablet Take 100 mg by mouth at bedtime. 03/24/19   [provider]  Vitamin D, Cholecalciferol, 50 MCG (2000 UT) CAPS Take 2,000 Units by mouth daily.    [provider]    Family History Family History  Problem Relation Age of Onset  . CVA Mother   . Deep vein thrombosis Mother   . CVA Father     Social History Social History   Tobacco Use  . Smoking status: Former Research scientist (life sciences)  . Smokeless tobacco: Never Used  Substance Use Topics  . Alcohol use: Not Currently  . Drug use: Never     Allergies  Patient has no known allergies.   Review of Systems Review of Systems  Respiratory: Positive for shortness of breath. Negative for cough and wheezing.   Cardiovascular: Positive for chest pain. Negative for palpitations and leg swelling.  All other systems reviewed and are negative.    Physical Exam Updated Vital Signs BP 130/71   Pulse 81   Temp 99.4 F (37.4 C) (Rectal)   Resp (!) 21   Ht 5\' 10"  (1.778 m)   Wt 86.2 kg   SpO2 94%   BMI 27.26 kg/m   Physical Exam Vitals signs and nursing note reviewed.  Constitutional:      General: He is not in acute distress.    Appearance: He is well-developed.   HENT:     Head: Normocephalic and atraumatic.  Neck:     Musculoskeletal: Neck supple.  Cardiovascular:     Rate and Rhythm: Normal rate and regular rhythm.     Heart sounds: Normal heart sounds. No murmur.  Pulmonary:     Effort: Pulmonary effort is normal. No respiratory distress.     Comments: Increased respiratory rate. Lower lung fields diminished bilaterally with crackles to left lung base.  Abdominal:     General: There is no distension.     Palpations: Abdomen is soft.     Tenderness: There is no abdominal tenderness.  Skin:    General: Skin is warm and dry.  Neurological:     Mental Status: He is alert and oriented to person, place, and time.      ED Treatments / Results  Labs (all labs ordered are listed, but only abnormal results are displayed) Labs Reviewed  SARS CORONAVIRUS 2 ([facility]) - Abnormal; Notable for the following components:      Result Value   SARS Coronavirus 2 POSITIVE (*)    All other components within normal limits  CBC WITH DIFFERENTIAL/PLATELET - Abnormal; Notable for the following components:   RBC 3.88 (*)    HCT 38.8 (*)    Lymphs Abs 0.4 (*)    All other components within normal limits  BASIC METABOLIC PANEL - Abnormal; Notable for the following components:   Sodium 132 (*)    Chloride 96 (*)    CO2 20 (*)    Glucose, Bld 137 (*)    BUN 30 (*)    Creatinine, Ser 1.28 (*)    Calcium 8.5 (*)    GFR calc non Af Amer 53 (*)    Anion gap 16 (*)    All other components within normal limits  CULTURE, BLOOD (ROUTINE X 2)  CULTURE, BLOOD (ROUTINE X 2)  LACTIC ACID, PLASMA  LACTIC ACID, PLASMA  TROPONIN I (HIGH SENSITIVITY)  TROPONIN I (HIGH SENSITIVITY)    EKG EKG Interpretation  Date/Time:  Wednesday June 16 2019 12:22:15 EDT Ventricular Rate:  103 PR Interval:  172 QRS Duration: 84 QT Interval:  338 QTC Calculation: 442 R Axis:   89 Text Interpretation:  Sinus tachycardia with  Premature atrial complexes Nonspecific ST abnormality Abnormal ECG Confirmed by Quintella Reichert [phone]) on 06/16/2019 12:27:24 PM   Radiology Dg Chest Portable 1 View  Result Date: 06/16/2019 CLINICAL DATA:  Shortness of breath and hypoxia EXAM: PORTABLE CHEST 1 VIEW COMPARISON:  May 31, 2019 FINDINGS: The mediastinal contour is normal. Heart size is enlarged. Patchy consolidation is identified in the left lung base and throughout the right lung. There is no pleural effusion or pulmonary edema. No  acute abnormalities identified in the visualized bony structures. IMPRESSION: Bilateral pneumonias. Electronically Signed   By: Abelardo Diesel M.D.   On: 06/16/2019 13:51    Procedures Procedures (including critical care time)  Medications Ordered in ED Medications  ceFEPIme (MAXIPIME) 2 g in sodium chloride 0.9 % 100 mL IVPB (2 g Intravenous Bolus from Bag 06/16/19 1508)  vancomycin (VANCOCIN) 1,500 mg in sodium chloride 0.9 % 500 mL IVPB (1,500 mg Intravenous Bolus from Bag 06/16/19 1533)     Initial Impression / Assessment and Plan / ED Course  I have reviewed the triage vital signs and the nursing notes.  Pertinent labs & imaging results that were available during my care of the patient were reviewed by me and considered in my medical decision making (see chart for details).       Teige Rountree is a 78 y.o. male who presents to ED for progressively worsening shortness of breath over the last week. Chart extensively reviewed.  He was admitted to the hospital 04/12/23 pulmonary embolism.  He has been started on Xarelto and endorses compliance with his anticoagulation.  In triage, patient was noted to be hypoxic in the low 80s on room air.  On my initial evaluation, he was requiring 4-5 L [place] 2 to maintain oxygen saturations above 94%. CXR shows bilateral PNA. Blood cx's obtained. Normal white count. Given recent hospitalization, was started on Vanco and cefepime.  Hospitalist consulted who will admit.   Aware that Covid test is pending.  Patient seen by and discussed with Dr. Ralene Bathe who agrees with treatment plan.   Final Clinical Impressions(s) / ED Diagnoses   Final diagnoses:  Pneumonia of both lower lobes due to infectious organism [facility])    ED Discharge Orders    None       Earon Rivest, Ozella Almond, PA-C 06/16/19 1534    Quintella Reichert, MD 06/17/19 1229

## 2019-06-16 NOTE — H&P (Addendum)
[facility]                                                                                    Patient Demographics  Jason [name], is a 78 y.o. male  CSN: [id number]  MRN: [id number]  DOB - 01/13/1941  Admit Date - 06/16/2019  Outpatient Primary MD for the patient is Dorthy Cooler, [place], MD   With History of -  Past Medical History:  Diagnosis Date  . COPD (chronic obstructive pulmonary disease) ([place])   . Hypertension   . Pulmonary embolism [facility])       Past Surgical History:  Procedure Laterality Date  . NO PAST SURGERIES      in for   Chief Complaint  Patient presents with  . Shortness of Breath     HPI  Jason [name]  is a 78 y.o. male, with past medical history significant for COPD, hypertension and a recent PE on Xarelto presenting with worsening shortness of breath for the last 1 week.  No fever or chills, no abdominal pain nausea or vomiting.  Reports right-sided pleuritic chest pain.  Nonproductive cough. In the emergency room his chest x-ray showed bilateral pneumonia and the patient necessitated 5 L/min, nasal cannula.  Patient is mildly confused.  Son at bedside.    Review of Systems    Denies abdominal pain, nausea vomiting. Denies fever chills Denies headache, dizziness or loss of consciousness Reports cough, nonproductive No rectal  bleeding or hematuria No focal weaknesses No lower extremity edema   Social History Social History   Tobacco Use  . Smoking status: Former Research scientist (life sciences)  . Smokeless tobacco: Never Used  Substance Use Topics  . Alcohol use: Not Currently     Family History Family History  Problem Relation Age of Onset  . CVA Mother   . Deep vein thrombosis Mother   . CVA Father      Prior to Admission medications   Medication Sig Start Date End Date Taking? Authorizing Provider  chlordiazePOXIDE (LIBRIUM) 5 MG capsule Take 1 capsule (5 mg total) by mouth 3 (three) times daily as needed for anxiety (or tremors). 06/01/19    Mesner, Corene Cornea, MD  lisinopril-hydrochlorothiazide (ZESTORETIC) 20-25 MG tablet Take 1 tablet by mouth daily. 04/08/19   [provider]  Multiple Vitamins-Minerals (MULTIVITAMIN WITH MINERALS) tablet Take 1 tablet by mouth daily.    [provider]  Nutritional Supplements (JOINT FORMULA PO) Take 1 tablet by mouth daily.    [provider]  potassium chloride SA (K-DUR) 20 MEQ tablet Take 2 tablets (40 mEq total) by mouth daily. 04/14/19   Kayleen Memos, DO  pravastatin (PRAVACHOL) 80 MG tablet Take 80 mg by mouth daily. 04/07/19   [provider]  rivaroxaban (XARELTO) 20 MG TABS tablet Take 20 mg by mouth daily.    [provider]  SPIRIVA HANDIHALER 18 MCG inhalation capsule Place 1 capsule into inhaler and inhale daily. 12/15/18   [provider]  traZODone (DESYREL) 100 MG tablet Take 100 mg by mouth at bedtime. 03/24/19   [provider]  Vitamin D, Cholecalciferol, 50 MCG (2000 UT) CAPS Take 2,000 Units by mouth  daily.    [provider]    No Known Allergies  Physical Exam  Vitals  Blood pressure 130/71, pulse 81, temperature 99.4 F (37.4 C), temperature source Rectal, resp. rate (!) 21, height 5\' 10"  (1.778 m), weight 86.2 kg, SpO2 94 %.  GA: Elderly, chronically ill looks tired HEENT no jaundice or pallor, no facial deviation oral thrush Neck supple, no neck vein distention Chest decreased breath sounds bilaterally Heart normal S1-S2, tachycardic Abdomen soft, nontender Extremities no clubbing cyanosis or edema Neuro : nonfocal patient moving all extremities Skin no rashes or ulcers   Data Review  CBC Recent Labs  Lab 06/16/19 1301  WBC 5.8  HGB 13.0  HCT 38.8*  PLT 385  MCV 100.0  MCH 33.5  MCHC 33.5  RDW 13.2  LYMPHSABS 0.4*  MONOABS 0.4  EOSABS 0.0  BASOSABS 0.0    ------------------------------------------------------------------------------------------------------------------  Chemistries  Recent Labs  Lab 06/16/19 1301  NA 132*  K 3.5  CL 96*  CO2 20*  GLUCOSE 137*  BUN 30*  CREATININE 1.28*  CALCIUM 8.5*   ------------------------------------------------------------------------------------------------------------------ estimated creatinine clearance is 48.3 mL/min (A) (by C-G formula based on SCr of 1.28 mg/dL (H)). ------------------------------------------------------------------------------------------------------------------ No results for input(s): TSH, T4TOTAL, T3FREE, THYROIDAB in the last 72 hours.  Invalid input(s): FREET3   Coagulation profile No results for input(s): INR, PROTIME in the last 168 hours. ------------------------------------------------------------------------------------------------------------------- No results for input(s): DDIMER in the last 72 hours. -------------------------------------------------------------------------------------------------------------------  Cardiac Enzymes No results for input(s): CKMB, TROPONINI, MYOGLOBIN in the last 168 hours.  Invalid input(s): CK ------------------------------------------------------------------------------------------------------------------ Invalid input(s): POCBNP   ---------------------------------------------------------------------------------------------------------------  Urinalysis    Component Value Date/Time   COLORURINE YELLOW 05/31/2019 2218   APPEARANCEUR CLEAR 05/31/2019 2218   LABSPEC >1.030 (H) 05/31/2019 2218   PHURINE 5.5 05/31/2019 2218   GLUCOSEU NEGATIVE 05/31/2019 2218   HGBUR SMALL (A) 05/31/2019 2218   BILIRUBINUR MODERATE (A) 05/31/2019 2218   KETONESUR 40 (A) 05/31/2019 2218   PROTEINUR 100 (A) 05/31/2019 2218   NITRITE NEGATIVE 05/31/2019 2218   LEUKOCYTESUR NEGATIVE 05/31/2019 2218     ----------------------------------------------------------------------------------------------------------------   Imaging results:   Dg Chest 2 View  Result Date: 05/31/2019 CLINICAL DATA:  Chills and shaking, fatigue EXAM: CHEST - 2 VIEW COMPARISON:  April 12, 2019 FINDINGS: No large airspace consolidation. No pleural effusion. The cardiomediastinal silhouette is unremarkable. There is atherosclerotic calcification the aortic knob with a tortuous descending aorta. Degenerative changes seen in the midthoracic spine. No acute osseous findings. IMPRESSION: No acute cardiopulmonary process. Electronically Signed   By: Prudencio Pair M.D.   On: 05/31/2019 23:53   Dg Chest Portable 1 View  Result Date: 06/16/2019 CLINICAL DATA:  Shortness of breath and hypoxia EXAM: PORTABLE CHEST 1 VIEW COMPARISON:  May 31, 2019 FINDINGS: The mediastinal contour is normal. Heart size is enlarged. Patchy consolidation is identified in the left lung base and throughout the right lung. There is no pleural effusion or pulmonary edema. No acute abnormalities identified in the visualized bony structures. IMPRESSION: Bilateral pneumonias. Electronically Signed   By: Abelardo Diesel M.D.   On: 06/16/2019 13:51    EKG sinus tach with PACs at 103 bpm.  There is some ST depression in the anterior lateral leads.  Assessment & Plan   COVID-19 with bilateral pneumonia nasal cannula 5 L/min Remdesivir  per pharmacy consult Will evaluate for Actemra Status post IV antibiotics in the emergency room, will monitor  History of pulmonary emboli on Xarelto recently admitted on 04/2019  COPD Nebulizer treatment Solu-Medrol  History of CAD with ST depressions in anterolateral leads at this time Serial troponins  History of tobacco/alcohol abuse Counseled   DVT prophylaxis, Xarelto  AM Labs Ordered, also please review Full Orders  Family Communication: Discussed with son at bedside  Code Status full  Disposition  Plan: Home  Time spent in minutes : 49 minutes  Condition GUARDED   @SIGNATURE @

## 2019-06-16 NOTE — Progress Notes (Signed)
[place] for remdesivir Indication: COVID  No Known Allergies  Vital Signs: Temp: 99.4 F (37.4 C) (08/12 1309) Temp Source: Rectal (08/12 1309) BP: 121/60 (08/12 1515) Pulse Rate: 81 (08/12 1445) Intake/Output from previous day: No intake/output data recorded. Intake/Output from this shift: Total I/O In: 500 [IV Piggyback:500] Out: -   Labs: Recent Labs    06/16/19 1301 06/16/19 1630  WBC 5.8  --   HGB 13.0  --   HCT 38.8*  --   PLT 385  --   CREATININE 1.28*  --   ALBUMIN  --  2.3*  PROT  --  6.7  AST  --  35  ALT  --  22  ALKPHOS  --  76  BILITOT  --  0.8  BILIDIR  --  <0.1  IBILI  --  NOT CALCULATED   Estimated Creatinine Clearance: 48.3 mL/min (A) (by C-G formula based on SCr of 1.28 mg/dL (H)).  Assessment: 54 yom presented to the ED with SOB, found to be COVID positive. Pharmacy asked to dose remdesivir. Pt is requiring supplemental oxygyen. He chest x-ray is reported with bilateral pneumonia. Pt has adequate renal and hepatic function.   Plan:  Remdesivir 200mg  IV x 1 then 100mg  IV daily x 4 doses F/u renal fxn, hepatic function and clinical status  Taressa Rauh, Rande Lawman 06/16/2019,7:49 PM

## 2019-06-17 DIAGNOSIS — U071 COVID-19: Secondary | ICD-10-CM

## 2019-06-17 DIAGNOSIS — Z8616 Personal history of COVID-19: Secondary | ICD-10-CM

## 2019-06-17 DIAGNOSIS — E785 Hyperlipidemia, unspecified: Secondary | ICD-10-CM

## 2019-06-17 DIAGNOSIS — I2510 Atherosclerotic heart disease of native coronary artery without angina pectoris: Secondary | ICD-10-CM

## 2019-06-17 LAB — CBC
HCT: 38.7 % — ABNORMAL LOW (ref 39.0–52.0)
Hemoglobin: 13 g/dL (ref 13.0–17.0)
MCH: 33.1 pg (ref 26.0–34.0)
MCHC: 33.6 g/dL (ref 30.0–36.0)
MCV: 98.5 fL (ref 80.0–100.0)
Platelets: 378 10*3/uL (ref 150–400)
RBC: 3.93 MIL/uL — ABNORMAL LOW (ref 4.22–5.81)
RDW: 13.2 % (ref 11.5–15.5)
WBC: 2.8 10*3/uL — ABNORMAL LOW (ref 4.0–10.5)
nRBC: 0 % (ref 0.0–0.2)

## 2019-06-17 LAB — C-REACTIVE PROTEIN: CRP: 20.9 mg/dL — ABNORMAL HIGH (ref <1.0)

## 2019-06-17 LAB — BASIC METABOLIC PANEL
Anion gap: 16 — ABNORMAL HIGH (ref 5–15)
BUN: 23 mg/dL (ref 8–23)
CO2: 18 mmol/L — ABNORMAL LOW (ref 22–32)
Calcium: 8.4 mg/dL — ABNORMAL LOW (ref 8.9–10.3)
Chloride: 102 mmol/L (ref 98–111)
Creatinine, Ser: 0.95 mg/dL (ref 0.61–1.24)
GFR calc Af Amer: >60 mL/min (ref >60)
GFR calc non Af Amer: >60 mL/min (ref >60)
Glucose, Bld: 182 mg/dL — ABNORMAL HIGH (ref 70–99)
Potassium: 3.4 mmol/L — ABNORMAL LOW (ref 3.5–5.1)
Sodium: 136 mmol/L (ref 135–145)

## 2019-06-17 LAB — PROCALCITONIN: Procalcitonin: <0.1 ng/mL

## 2019-06-17 LAB — OCCULT BLOOD X 1 CARD TO LAB, STOOL: Fecal Occult Bld: POSITIVE — AB

## 2019-06-17 LAB — C DIFFICILE QUICK SCREEN W PCR REFLEX
C Diff antigen: NEGATIVE
C Diff interpretation: NOT DETECTED
C Diff toxin: NEGATIVE

## 2019-06-17 LAB — FERRITIN: Ferritin: 1145 ng/mL — ABNORMAL HIGH (ref 24–336)

## 2019-06-17 LAB — TRIGLYCERIDES: Triglycerides: 37 mg/dL (ref <150)

## 2019-06-17 LAB — CK: Total CK: 168 U/L (ref 49–397)

## 2019-06-17 LAB — MAGNESIUM: Magnesium: 1.9 mg/dL (ref 1.7–2.4)

## 2019-06-17 LAB — D-DIMER, QUANTITATIVE: D-Dimer, Quant: 2.24 ug/mL-FEU — ABNORMAL HIGH (ref 0.00–0.50)

## 2019-06-17 LAB — PHOSPHORUS: Phosphorus: 3.6 mg/dL (ref 2.5–4.6)

## 2019-06-17 MED ORDER — POTASSIUM CHLORIDE CRYS ER 20 MEQ PO TBCR
40.0000 meq | EXTENDED_RELEASE_TABLET | Freq: Once | ORAL | Status: DC
  Filled 2019-06-17: qty 2

## 2019-06-17 MED ORDER — ALBUTEROL SULFATE HFA 108 (90 BASE) MCG/ACT IN AERS
2.0000 | INHALATION_SPRAY | Freq: Four times a day (QID) | RESPIRATORY_TRACT | Status: DC
  Administered 2019-06-17 – 2019-06-29 (×45): 2 via RESPIRATORY_TRACT
  Filled 2019-06-17: qty 6.7

## 2019-06-17 NOTE — Progress Notes (Signed)
PROGRESS NOTE    Jason [name]  LNL:[id number] DOB: December 17, 1940 DOA: 06/16/2019 PCP: Lujean Amel, MD     Brief Narrative:  Jason [name] is a 78 y.o. male, with past medical history significant for COPD, hypertension and a recent PE dx June 2020 on Xarelto presenting with worsening shortness of breath for the last 1 week.  No fever or chills, no abdominal pain nausea or vomiting.  Reports right-sided pleuritic chest pain and nonproductive cough. In the emergency room, his chest x-ray showed bilateral pneumonia and the patient necessitated 5 L/min, nasal cannula. He tested positive for COVID-19 and he was started on Solu-Medrol, Remdesivir.  New events last 24 hours / Subjective: Patient continues to have some loose stools, denies any blood.  He states that he had a colonoscopy few years ago, seemed to have been normal as far as he knew.  He shrugs when asked how he is feeling.  Assessment & Plan:   Principal Problem:   COVID-19 virus infection Active Problems:   Tobacco abuse   Pulmonary embolism (HCC)   Acute respiratory failure with hypoxia (HCC)   Bilateral pneumonia   CAD (coronary artery disease)   Hyperlipidemia   COVID-19 -Ferritin elevated, CRP elevated, procalcitonin negative, dimer elevated, WBC 2.8 -Started on Solu-Medrol, Remdesivir  Acute hypoxemic respiratory failure -On 5 L nasal cannula O2 currently.  Continue to wean as able  CAD -Stable.  No chest pain and troponin was negative  Hyperlipidemia -Continue Pravachol  Recent diagnosis PE -Diagnosed in June 2020.  Followed with Dr. Valeta Harms.  On lifetime Xarelto at this point as this was his second episode blood clot  Positive FOBT -Patient without frank blood loss.  FOBT was checked which was positive.  His hemoglobin has remained stable 13 yesterday and 13 this morning.  Blood pressure has also remained stable.  Colonoscopy was completed in 2015 which revealed some polyps in the cecum, transverse colon as well as  diverticulosis and internal and external hemorrhoids.  I discussed with  GI PA on-call.  Due to patient's stability without gross hemorrhage, will defer inpatient consultation at this time.  They are happy to consult if patient develops worsening anemia or hemorrhage.  Hypokalemia -Replace, trend   DVT prophylaxis: Xarelto Code Status: Full Family Communication: None Disposition Plan: Transfer to [facility] today   Consultants:   None  Procedures:   None   Antimicrobials:  Anti-infectives (From admission, onward)   Start     Dose/Rate Route Frequency Ordered Stop   06/17/19 2200  remdesivir 100 mg in sodium chloride 0.9 % 250 mL IVPB     100 mg 500 mL/hr over 30 Minutes Intravenous Every 24 hours 06/16/19 1943 06/21/19 2159   06/16/19 2200  remdesivir 200 mg in sodium chloride 0.9 % 250 mL IVPB     200 mg 500 mL/hr over 30 Minutes Intravenous Once 06/16/19 1943 06/16/19 2202   06/16/19 1500  ceFEPIme (MAXIPIME) 2 g in sodium chloride 0.9 % 100 mL IVPB     2 g 200 mL/hr over 30 Minutes Intravenous  Once 06/16/19 1414 06/16/19 1710   06/16/19 1500  vancomycin (VANCOCIN) 1,500 mg in sodium chloride 0.9 % 500 mL IVPB     1,500 mg 250 mL/hr over 120 Minutes Intravenous  Once 06/16/19 1431 06/16/19 1939        Objective: Vitals:   06/17/19 0000 06/17/19 0600 06/17/19 0800 06/17/19 0932  BP:  (!) 146/76 130/60   Pulse:  60    Resp:  Marland Kitchen)  22 (!) 22   Temp:  (!) 97.5 F (36.4 C) 98.3 F (36.8 C)   TempSrc:  Oral Oral   SpO2: 94% 90% (!) 88% 92%  Weight:      Height:        Intake/Output Summary (Last 24 hours) at 06/17/2019 1122 Last data filed at 06/17/2019 0900 Gross per 24 hour  Intake 700 ml  Output -  Net 700 ml   Filed Weights   06/16/19 1233 06/16/19 2120  Weight: 86.2 kg 82.5 kg    Examination:  General exam: Appears calm and comfortable, very hard of hearing Respiratory system: Clear to auscultation. Respiratory effort normal. No respiratory distress.  No conversational dyspnea.  Cardiovascular system: S1 & S2 heard, RRR. No murmurs. No pedal edema. Gastrointestinal system: Abdomen is nondistended, soft and nontender. Normal bowel sounds heard. Central nervous system: Alert and oriented. No focal neurological deficits. Speech clear.  Extremities: Symmetric in appearance  Skin: No rashes, lesions or ulcers on exposed skin  Psychiatry: Judgement and insight appear normal. Mood & affect appropriate.   Data Reviewed: I have personally reviewed following labs and imaging studies  CBC: Recent Labs  Lab 06/16/19 1301 06/17/19 0744  WBC 5.8 2.8*  NEUTROABS 5.0  --   HGB 13.0 13.0  HCT 38.8* 38.7*  MCV 100.0 98.5  PLT 385 627   Basic Metabolic Panel: Recent Labs  Lab 06/16/19 1301 06/17/19 0744  NA 132* 136  K 3.5 3.4*  CL 96* 102  CO2 20* 18*  GLUCOSE 137* 182*  BUN 30* 23  CREATININE 1.28* 0.95  CALCIUM 8.5* 8.4*  MG  --  1.9  PHOS  --  3.6   GFR: Estimated Creatinine Clearance: 65.1 mL/min (by C-G formula based on SCr of 0.95 mg/dL). Liver Function Tests: Recent Labs  Lab 06/16/19 1630  AST 35  ALT 22  ALKPHOS 47  BILITOT 0.8  PROT 6.7  ALBUMIN 2.3*   No results for input(s): LIPASE, AMYLASE in the last 168 hours. No results for input(s): AMMONIA in the last 168 hours. Coagulation Profile: No results for input(s): INR, PROTIME in the last 168 hours. Cardiac Enzymes: Recent Labs  Lab 06/17/19 0744  CKTOTAL 168   BNP (last 3 results) No results for input(s): PROBNP in the last 8760 hours. HbA1C: No results for input(s): HGBA1C in the last 72 hours. CBG: No results for input(s): GLUCAP in the last 168 hours. Lipid Profile: Recent Labs    06/17/19 0744  TRIG 37   Thyroid Function Tests: No results for input(s): TSH, T4TOTAL, FREET4, T3FREE, THYROIDAB in the last 72 hours. Anemia Panel: Recent Labs    06/16/19 1705 06/17/19 0744  FERRITIN 1,078* 1,145*   Sepsis Labs: Recent Labs  Lab 06/16/19  1515 06/16/19 1630 06/17/19 0744  PROCALCITON  --  <0.10 <0.10  LATICACIDVEN 1.7  --   --     Recent Results (from the past 240 hour(s))  SARS Coronavirus 2 [facility] order, Performed in [facility] hospital lab) Nasopharyngeal Nasopharyngeal Swab     Status: Abnormal   Collection Time: 06/16/19  1:20 PM   Specimen: Nasopharyngeal Swab  Result Value Ref Range Status   SARS Coronavirus 2 POSITIVE (A) NEGATIVE Final    Comment: RESULT CALLED TO, READ BACK BY AND VERIFIED WITH: Claretta Fraise RN 15:35 06/16/19 ([email or web address]) (NOTE) If result is NEGATIVE SARS-CoV-2 target nucleic acids are NOT DETECTED. The SARS-CoV-2 RNA is generally detectable in upper and lower  respiratory specimens during the  acute phase of infection. The lowest  concentration of SARS-CoV-2 viral copies this assay can detect is 250  copies / mL. A negative result does not preclude SARS-CoV-2 infection  and should not be used as the sole basis for treatment or other  patient management decisions.  A negative result may occur with  improper specimen collection / handling, submission of specimen other  than nasopharyngeal swab, presence of viral mutation(s) within the  areas targeted by this assay, and inadequate number of viral copies  (<250 copies / mL). A negative result must be combined with clinical  observations, patient history, and epidemiological information. If result is POSITIVE SARS-CoV-2 target nucleic acids are DETECTED.  The SARS-CoV-2 RNA is generally detectable in upper and lower  respiratory specimens during the acute phase of infection.  Positive  results are indicative of active infection with SARS-CoV-2.  Clinical  correlation with patient history and other diagnostic information is  necessary to determine patient infection status.  Positive results do  not rule out bacterial infection or co-infection with other viruses. If result is PRESUMPTIVE POSTIVE SARS-CoV-2 nucleic acids MAY BE PRESENT.   A  presumptive positive result was obtained on the submitted specimen  and confirmed on repeat testing.  While 2019 novel coronavirus  (SARS-CoV-2) nucleic acids may be present in the submitted sample  additional confirmatory testing may be necessary for epidemiological  and / or clinical management purposes  to differentiate between  SARS-CoV-2 and other Sarbecovirus currently known to infect humans.  If clinically indicated additional testing with an alternate test  methodology [phone])  is advised. The SARS-CoV-2 RNA is generally  detectable in upper and lower respiratory specimens during the acute  phase of infection. The expected result is Negative. Fact Sheet for Patients:  StrictlyIdeas.no Fact Sheet for Healthcare Providers: BankingDealers.co.za This test is not yet approved or cleared by the [place] FDA and has been authorized for detection and/or diagnosis of SARS-CoV-2 by FDA under an Emergency Use Authorization (EUA).  This EUA will remain in effect (meaning this test can be used) for the duration of the COVID-19 declaration under Section 564(b)(1) of the Act, 21 U.S.C. section 360bbb-3(b)(1), unless the authorization is terminated or revoked sooner. Performed at [facility], [place] [address] [place], [place] [address]   Respiratory Panel by PCR     Status: None   Collection Time: 06/16/19  9:55 PM   Specimen: Nasopharyngeal Swab; Respiratory  Result Value Ref Range Status   Adenovirus NOT DETECTED NOT DETECTED Final   Coronavirus 229E NOT DETECTED NOT DETECTED Final    Comment: (NOTE) The Coronavirus on the Respiratory Panel, DOES NOT test for the novel  Coronavirus (2019 nCoV)    Coronavirus HKU1 NOT DETECTED NOT DETECTED Final   Coronavirus NL63 NOT DETECTED NOT DETECTED Final   Coronavirus OC43 NOT DETECTED NOT DETECTED Final   Metapneumovirus NOT DETECTED NOT DETECTED Final   Rhinovirus / Enterovirus NOT  DETECTED NOT DETECTED Final   Influenza A NOT DETECTED NOT DETECTED Final   Influenza B NOT DETECTED NOT DETECTED Final   Parainfluenza Virus 1 NOT DETECTED NOT DETECTED Final   Parainfluenza Virus 2 NOT DETECTED NOT DETECTED Final   Parainfluenza Virus 3 NOT DETECTED NOT DETECTED Final   Parainfluenza Virus 4 NOT DETECTED NOT DETECTED Final   Respiratory Syncytial Virus NOT DETECTED NOT DETECTED Final   Bordetella pertussis NOT DETECTED NOT DETECTED Final   Chlamydophila pneumoniae NOT DETECTED NOT DETECTED Final   Mycoplasma pneumoniae  NOT DETECTED NOT DETECTED Final    Comment: Performed at [facility], [place] [address], [place], [place] [address]  C difficile quick scan w PCR reflex     Status: None   Collection Time: 06/16/19 10:05 PM   Specimen: STOOL  Result Value Ref Range Status   C Diff antigen NEGATIVE NEGATIVE Final   C Diff toxin NEGATIVE NEGATIVE Final   C Diff interpretation No C. difficile detected.  Final    Comment: Performed at [facility], [place] [address] [place], [place] [address]      Radiology Studies: Dg Chest Portable 1 View  Result Date: 06/16/2019 CLINICAL DATA:  Shortness of breath and hypoxia EXAM: PORTABLE CHEST 1 VIEW COMPARISON:  May 31, 2019 FINDINGS: The mediastinal contour is normal. Heart size is enlarged. Patchy consolidation is identified in the left lung base and throughout the right lung. There is no pleural effusion or pulmonary edema. No acute abnormalities identified in the visualized bony structures. IMPRESSION: Bilateral pneumonias. Electronically Signed   By: Abelardo Diesel M.D.   On: 06/16/2019 13:51      Scheduled Meds: . albuterol  2 puff Inhalation Q6H  . cholecalciferol  2,000 Units Oral Daily  . methylPREDNISolone (SOLU-MEDROL) injection  60 mg Intravenous Q6H  . multivitamin with minerals  1 tablet Oral Daily  . pravastatin  80 mg Oral QHS  . rivaroxaban  20 mg Oral QPC supper  . traZODone  100 mg Oral QHS    Continuous Infusions: . remdesivir 100 mg in NS 250 mL       LOS: 1 day      Time spent: 40 minutes   Dessa Phi, DO [facility] [email or web address] 06/17/2019, 11:22 AM

## 2019-06-17 NOTE — Progress Notes (Signed)
Pt son brought to [facility] for pt one white phone charger with Katomi extension cord, 4 pks of hearing aid batteries. In pts possession.

## 2019-06-17 NOTE — Progress Notes (Signed)
Pt arrived to room 111. Pt HOH with hearing aids in bilaterally. Oriented, 92% 6L HF. Skin check completed see flowsheet. Blue pair of pants pt choose to keep in bed and yellow shirt at bedside. Brown shoes brought in by pt as well. Dr. Aileen Fass texted paged regarding pt being transferred to [facility]. V/S obtained see flowsheet. Will continue to monitor.

## 2019-06-18 DIAGNOSIS — J9601 Acute respiratory failure with hypoxia: Secondary | ICD-10-CM

## 2019-06-18 LAB — CBC
HCT: 33.9 % — ABNORMAL LOW (ref 39.0–52.0)
Hemoglobin: 11.2 g/dL — ABNORMAL LOW (ref 13.0–17.0)
MCH: 32.7 pg (ref 26.0–34.0)
MCHC: 33 g/dL (ref 30.0–36.0)
MCV: 99.1 fL (ref 80.0–100.0)
Platelets: 412 10*3/uL — ABNORMAL HIGH (ref 150–400)
RBC: 3.42 MIL/uL — ABNORMAL LOW (ref 4.22–5.81)
RDW: 13.2 % (ref 11.5–15.5)
WBC: 8.7 10*3/uL (ref 4.0–10.5)
nRBC: 0 % (ref 0.0–0.2)

## 2019-06-18 LAB — CK: Total CK: 78 U/L (ref 49–397)

## 2019-06-18 LAB — HEPATIC FUNCTION PANEL
ALT: 27 U/L (ref 0–44)
AST: 35 U/L (ref 15–41)
Albumin: 2.1 g/dL — ABNORMAL LOW (ref 3.5–5.0)
Alkaline Phosphatase: 46 U/L (ref 38–126)
Bilirubin, Direct: 0.1 mg/dL (ref 0.0–0.2)
Indirect Bilirubin: 0.3 mg/dL (ref 0.3–0.9)
Total Bilirubin: 0.4 mg/dL (ref 0.3–1.2)
Total Protein: 5.7 g/dL — ABNORMAL LOW (ref 6.5–8.1)

## 2019-06-18 LAB — PROCALCITONIN: Procalcitonin: <0.1 ng/mL

## 2019-06-18 LAB — BASIC METABOLIC PANEL
Anion gap: 13 (ref 5–15)
BUN: 31 mg/dL — ABNORMAL HIGH (ref 8–23)
CO2: 22 mmol/L (ref 22–32)
Calcium: 8.6 mg/dL — ABNORMAL LOW (ref 8.9–10.3)
Chloride: 102 mmol/L (ref 98–111)
Creatinine, Ser: 0.87 mg/dL (ref 0.61–1.24)
GFR calc Af Amer: >60 mL/min (ref >60)
GFR calc non Af Amer: >60 mL/min (ref >60)
Glucose, Bld: 194 mg/dL — ABNORMAL HIGH (ref 70–99)
Potassium: 3.4 mmol/L — ABNORMAL LOW (ref 3.5–5.1)
Sodium: 137 mmol/L (ref 135–145)

## 2019-06-18 LAB — PHOSPHORUS: Phosphorus: 2.4 mg/dL — ABNORMAL LOW (ref 2.5–4.6)

## 2019-06-18 LAB — MAGNESIUM: Magnesium: 1.9 mg/dL (ref 1.7–2.4)

## 2019-06-18 LAB — FERRITIN: Ferritin: 1141 ng/mL — ABNORMAL HIGH (ref 24–336)

## 2019-06-18 LAB — C-REACTIVE PROTEIN: CRP: 13.3 mg/dL — ABNORMAL HIGH (ref <1.0)

## 2019-06-18 LAB — INTERLEUKIN-6, PLASMA: Interleukin-6, Plasma: 8.9 pg/mL (ref 0.0–12.2)

## 2019-06-18 LAB — D-DIMER, QUANTITATIVE: D-Dimer, Quant: 1.61 ug/mL-FEU — ABNORMAL HIGH (ref 0.00–0.50)

## 2019-06-18 LAB — TRIGLYCERIDES: Triglycerides: 32 mg/dL (ref <150)

## 2019-06-18 MED ORDER — DEXAMETHASONE 6 MG PO TABS
6.0000 mg | ORAL_TABLET | Freq: Every day | ORAL | Status: DC
  Administered 2019-06-18 – 2019-06-25 (×8): 6 mg via ORAL
  Filled 2019-06-18 (×8): qty 1

## 2019-06-18 MED ORDER — POTASSIUM CHLORIDE CRYS ER 20 MEQ PO TBCR
40.0000 meq | EXTENDED_RELEASE_TABLET | Freq: Three times a day (TID) | ORAL | Status: AC
  Administered 2019-06-18 (×3): 40 meq via ORAL
  Filled 2019-06-18 (×3): qty 2

## 2019-06-18 NOTE — Progress Notes (Signed)
Pt resting in bed with call bell in reach. No changes. Call bell in reach.

## 2019-06-18 NOTE — Progress Notes (Signed)
Pt resting in bed with call bell in reach. NADN Will continue to monitor

## 2019-06-18 NOTE — Progress Notes (Addendum)
1915 Report received care assumed from [facility]. Call bell in reach. NADN Will continue to monitor.  0735 Pt resting in bed with call bell in reach. NADN Call bell in reach.

## 2019-06-18 NOTE — Progress Notes (Signed)
[facility] PROGRESS NOTE    Progress Note  Keyion Knack  XAJ:[id number] DOB: Jul 15, 1941 DOA: 06/16/2019 PCP: Lujean Amel, MD     Brief Narrative:   Lenzy Kerschner is an 78 y.o. male past medical history of COPD hypertension recent PE diagnosed June 2020 on Xarelto presented with worsening shortness of breath for 1 week.  To the hospital complaining of right-sided pleuritic chest pain and a nonproductive cough was found to have a chest x-ray showing bilateral infiltrates requiring 5 L of oxygen.  He SARS-CoV-2 PCR was positive he was started empirically on IV Remdesivir and Solu-Medrol. Symptoms started 8.5.2020 06/16/2019 IV Remdesivir 06/16/2019 IV Solu-Medrol  Assessment/Plan:   Acute respiratory failure with hypoxia due to COVID-19 virus infection: He is requiring 5 to 7 L high flow nasal cannula to keep saturations above 91%. Continue IV Remdesivir and IV Solu-Medrol. Try to keep the patient prone for at least 16 hours a day. His inflammatory markers are improving.  Calcitonin was less than 0.1, discontinue IV empiric antibiotics. To keep euvolemic. He relates his breathing is fairly stable compared to yesterday.  Recent diagnosis of PE: We will need to be on lifetime Xarelto, continue current dose of Xarelto.  Positive FOBT: Without frank blood, FOBT was positive on admission his hemoglobin has remained stable over the last 48 hours along with his blood pressure. He had a colonoscopy in 2015 which revealed some polyps in the cecum and some diverticulosis.    CAD (coronary artery disease): Stable  Hyperlipidemia: Continue statins.   DVT prophylaxis: xarelto Family Communication:none Disposition Plan/Barrier to D/C: once off oxygen Code Status:     Code Status Orders  (From admission, onward)         Start     Ordered   06/16/19 2108  Full code  Continuous     06/16/19 2107        Code Status History    Date Active Date Inactive Code Status Order ID  Comments User Context   04/12/2019 1320 04/14/2019 1423 Full Code [id number]  Norval Morton, MD ED   Advance Care Planning Activity        IV Access:    Peripheral IV   Procedures and diagnostic studies:   Dg Chest Portable 1 View  Result Date: 06/16/2019 CLINICAL DATA:  Shortness of breath and hypoxia EXAM: PORTABLE CHEST 1 VIEW COMPARISON:  May 31, 2019 FINDINGS: The mediastinal contour is normal. Heart size is enlarged. Patchy consolidation is identified in the left lung base and throughout the right lung. There is no pleural effusion or pulmonary edema. No acute abnormalities identified in the visualized bony structures. IMPRESSION: Bilateral pneumonias. Electronically Signed   By: Abelardo Diesel M.D.   On: 06/16/2019 13:51     Medical Consultants:    None.  Anti-Infectives:   IV Remdesivir  Subjective:    Amato Slivinski relates his breathing is better than yesterday but not back to baseline, he relates his oxygen makes him feel more comfortable.  Objective:    Vitals:   06/17/19 1700 06/17/19 1800 06/17/19 2000 06/17/19 2319  BP: (!) 99/44 115/74  109/63  Pulse: 71 84  64  Resp: (!) 24 (!) 29  18  Temp:   98.2 F (36.8 C)   TempSrc:      SpO2: (!) 88% 91%  91%  Weight:      Height:       SpO2: 91 % O2 Flow Rate (L/min): 7 L/min   Intake/Output Summary (  Last 24 hours) at 06/18/2019 0652 Last data filed at 06/17/2019 2100 Gross per 24 hour  Intake 340 ml  Output 200 ml  Net 140 ml   Filed Weights   06/16/19 1233 06/16/19 2120  Weight: 86.2 kg 82.5 kg    Exam: General exam: In no acute distress. Respiratory system: Good air movement and diffuse crackles bilaterally Cardiovascular system: S1 & S2 heard, RRR. No JVD.  Gastrointestinal system: Abdomen is nondistended, soft and nontender.  Central nervous system: Alert and oriented. No focal neurological deficits. Extremities: No pedal edema. Skin: No rashes, lesions or ulcers Psychiatry: Judgement  and insight appear normal. Mood & affect appropriate.    Data Reviewed:    Labs: Basic Metabolic Panel: Recent Labs  Lab 06/16/19 1301 06/17/19 0744 06/18/19 0104  NA 132* 136 137  K 3.5 3.4* 3.4*  CL 96* 102 102  CO2 20* 18* 22  GLUCOSE 137* 182* 194*  BUN 30* 23 31*  CREATININE 1.28* 0.95 0.87  CALCIUM 8.5* 8.4* 8.6*  MG  --  1.9 1.9  PHOS  --  3.6 2.4*   GFR Estimated Creatinine Clearance: 71.1 mL/min (by C-G formula based on SCr of 0.87 mg/dL). Liver Function Tests: Recent Labs  Lab 06/16/19 1630  AST 35  ALT 22  ALKPHOS 47  BILITOT 0.8  PROT 6.7  ALBUMIN 2.3*   No results for input(s): LIPASE, AMYLASE in the last 168 hours. No results for input(s): AMMONIA in the last 168 hours. Coagulation profile No results for input(s): INR, PROTIME in the last 168 hours. COVID-19 Labs  Recent Labs    06/16/19 1630 06/16/19 1705 06/17/19 0744 06/18/19 0104  DDIMER  --  1.62* 2.24* 1.61*  FERRITIN  --  1,078* 1,145* 1,141*  LDH 303*  --   --   --   CRP  --  19.0* 20.9* 13.3*    Lab Results  Component Value Date   SARSCOV2NAA POSITIVE (A) 06/16/2019   SARSCOV2NAA NOT DETECTED 04/12/2019    CBC: Recent Labs  Lab 06/16/19 1301 06/17/19 0744 06/18/19 0104  WBC 5.8 2.8* 8.7  NEUTROABS 5.0  --   --   HGB 13.0 13.0 11.2*  HCT 38.8* 38.7* 33.9*  MCV 100.0 98.5 99.1  PLT 385 378 412*   Cardiac Enzymes: Recent Labs  Lab 06/17/19 0744 06/18/19 0104  CKTOTAL 168 78   BNP (last 3 results) No results for input(s): PROBNP in the last 8760 hours. CBG: No results for input(s): GLUCAP in the last 168 hours. D-Dimer: Recent Labs    06/17/19 0744 06/18/19 0104  DDIMER 2.24* 1.61*   Hgb A1c: No results for input(s): HGBA1C in the last 72 hours. Lipid Profile: Recent Labs    06/17/19 0744 06/18/19 0104  TRIG 37 32   Thyroid function studies: No results for input(s): TSH, T4TOTAL, T3FREE, THYROIDAB in the last 72 hours.  Invalid input(s): FREET3  Anemia work up: Recent Labs    06/17/19 0744 06/18/19 0104  FERRITIN 1,145* 1,141*   Sepsis Labs: Recent Labs  Lab 06/16/19 1301 06/16/19 1515 06/16/19 1630 06/17/19 0744 06/18/19 0104  PROCALCITON  --   --  <0.10 <0.10 <0.10  WBC 5.8  --   --  2.8* 8.7  LATICACIDVEN  --  1.7  --   --   --    Microbiology Recent Results (from the past 240 hour(s))  SARS Coronavirus 2 [facility] order, Performed in [facility] hospital lab) Nasopharyngeal Nasopharyngeal Swab     Status:  Abnormal   Collection Time: 06/16/19  1:20 PM   Specimen: Nasopharyngeal Swab  Result Value Ref Range Status   SARS Coronavirus 2 POSITIVE (A) NEGATIVE Final    Comment: RESULT CALLED TO, READ BACK BY AND VERIFIED WITH: P. Pulliam RN 15:35 06/16/19 ([email or web address]) (NOTE) If result is NEGATIVE SARS-CoV-2 target nucleic acids are NOT DETECTED. The SARS-CoV-2 RNA is generally detectable in upper and lower  respiratory specimens during the acute phase of infection. The lowest  concentration of SARS-CoV-2 viral copies this assay can detect is 250  copies / mL. A negative result does not preclude SARS-CoV-2 infection  and should not be used as the sole basis for treatment or other  patient management decisions.  A negative result may occur with  improper specimen collection / handling, submission of specimen other  than nasopharyngeal swab, presence of viral mutation(s) within the  areas targeted by this assay, and inadequate number of viral copies  (<250 copies / mL). A negative result must be combined with clinical  observations, patient history, and epidemiological information. If result is POSITIVE SARS-CoV-2 target nucleic acids are DETECTED.  The SARS-CoV-2 RNA is generally detectable in upper and lower  respiratory specimens during the acute phase of infection.  Positive  results are indicative of active infection with SARS-CoV-2.  Clinical  correlation with patient history and other diagnostic information is   necessary to determine patient infection status.  Positive results do  not rule out bacterial infection or co-infection with other viruses. If result is PRESUMPTIVE POSTIVE SARS-CoV-2 nucleic acids MAY BE PRESENT.   A presumptive positive result was obtained on the submitted specimen  and confirmed on repeat testing.  While 2019 novel coronavirus  (SARS-CoV-2) nucleic acids may be present in the submitted sample  additional confirmatory testing may be necessary for epidemiological  and / or clinical management purposes  to differentiate between  SARS-CoV-2 and other Sarbecovirus currently known to infect humans.  If clinically indicated additional testing with an alternate test  methodology [phone])  is advised. The SARS-CoV-2 RNA is generally  detectable in upper and lower respiratory specimens during the acute  phase of infection. The expected result is Negative. Fact Sheet for Patients:  StrictlyIdeas.no Fact Sheet for Healthcare Providers: BankingDealers.co.za This test is not yet approved or cleared by the [place] FDA and has been authorized for detection and/or diagnosis of SARS-CoV-2 by FDA under an Emergency Use Authorization (EUA).  This EUA will remain in effect (meaning this test can be used) for the duration of the COVID-19 declaration under Section 564(b)(1) of the Act, 21 U.S.C. section 360bbb-3(b)(1), unless the authorization is terminated or revoked sooner. Performed at [facility], [place] [address] [place], [place] [address]   Culture, blood (routine x 2)     Status: None (Preliminary result)   Collection Time: 06/16/19  3:00 PM   Specimen: BLOOD RIGHT WRIST  Result Value Ref Range Status   Specimen Description BLOOD RIGHT WRIST  Final   Special Requests   Final    BOTTLES DRAWN AEROBIC AND ANAEROBIC Blood Culture adequate volume   Culture   Final    NO GROWTH < 24 HOURS Performed at [place], [place] [address] [place], [place] [address]    Report Status PENDING  Incomplete  Culture, blood (routine x 2)     Status: None (Preliminary result)   Collection Time: 06/16/19  3:05 PM   Specimen: BLOOD  Result Value Ref Range Status  Specimen Description BLOOD LEFT ANTECUBITAL  Final   Special Requests   Final    BOTTLES DRAWN AEROBIC AND ANAEROBIC Blood Culture adequate volume   Culture   Final    NO GROWTH < 24 HOURS Performed at [facility], [address], [place], [place] [address]    Report Status PENDING  Incomplete  Respiratory Panel by PCR     Status: None   Collection Time: 06/16/19  9:55 PM   Specimen: Nasopharyngeal Swab; Respiratory  Result Value Ref Range Status   Adenovirus NOT DETECTED NOT DETECTED Final   Coronavirus 229E NOT DETECTED NOT DETECTED Final    Comment: (NOTE) The Coronavirus on the Respiratory Panel, DOES NOT test for the novel  Coronavirus (2019 nCoV)    Coronavirus HKU1 NOT DETECTED NOT DETECTED Final   Coronavirus NL63 NOT DETECTED NOT DETECTED Final   Coronavirus OC43 NOT DETECTED NOT DETECTED Final   Metapneumovirus NOT DETECTED NOT DETECTED Final   Rhinovirus / Enterovirus NOT DETECTED NOT DETECTED Final   Influenza A NOT DETECTED NOT DETECTED Final   Influenza B NOT DETECTED NOT DETECTED Final   Parainfluenza Virus 1 NOT DETECTED NOT DETECTED Final   Parainfluenza Virus 2 NOT DETECTED NOT DETECTED Final   Parainfluenza Virus 3 NOT DETECTED NOT DETECTED Final   Parainfluenza Virus 4 NOT DETECTED NOT DETECTED Final   Respiratory Syncytial Virus NOT DETECTED NOT DETECTED Final   Bordetella pertussis NOT DETECTED NOT DETECTED Final   Chlamydophila pneumoniae NOT DETECTED NOT DETECTED Final   Mycoplasma pneumoniae NOT DETECTED NOT DETECTED Final    Comment: Performed at [facility], [place]. [address] [place], [place] [address]  C difficile quick scan w PCR reflex     Status: None   Collection Time: 06/16/19 10:05 PM   Specimen:  STOOL  Result Value Ref Range Status   C Diff antigen NEGATIVE NEGATIVE Final   C Diff toxin NEGATIVE NEGATIVE Final   C Diff interpretation No C. difficile detected.  Final    Comment: Performed at [facility], [place] [address], [place], [place] [address]     Medications:   . albuterol  2 puff Inhalation Q6H  . cholecalciferol  2,000 Units Oral Daily  . methylPREDNISolone (SOLU-MEDROL) injection  60 mg Intravenous Q6H  . multivitamin with minerals  1 tablet Oral Daily  . potassium chloride  40 mEq Oral Once  . pravastatin  80 mg Oral QHS  . rivaroxaban  20 mg Oral QPC supper  . traZODone  100 mg Oral QHS   Continuous Infusions: . remdesivir 100 mg in NS 250 mL 100 mg (06/17/19 2317)     LOS: 2 days   Charlynne Cousins  [facility]  06/18/2019, 6:52 AM

## 2019-06-19 LAB — COMPREHENSIVE METABOLIC PANEL
ALT: 31 U/L (ref 0–44)
AST: 38 U/L (ref 15–41)
Albumin: 2.2 g/dL — ABNORMAL LOW (ref 3.5–5.0)
Alkaline Phosphatase: 45 U/L (ref 38–126)
Anion gap: 9 (ref 5–15)
BUN: 34 mg/dL — ABNORMAL HIGH (ref 8–23)
CO2: 23 mmol/L (ref 22–32)
Calcium: 8.7 mg/dL — ABNORMAL LOW (ref 8.9–10.3)
Chloride: 108 mmol/L (ref 98–111)
Creatinine, Ser: 0.89 mg/dL (ref 0.61–1.24)
GFR calc Af Amer: >60 mL/min (ref >60)
GFR calc non Af Amer: >60 mL/min (ref >60)
Glucose, Bld: 160 mg/dL — ABNORMAL HIGH (ref 70–99)
Potassium: 4.9 mmol/L (ref 3.5–5.1)
Sodium: 140 mmol/L (ref 135–145)
Total Bilirubin: 0.4 mg/dL (ref 0.3–1.2)
Total Protein: 6.3 g/dL — ABNORMAL LOW (ref 6.5–8.1)

## 2019-06-19 LAB — C-REACTIVE PROTEIN: CRP: 6.8 mg/dL — ABNORMAL HIGH (ref <1.0)

## 2019-06-19 LAB — PHOSPHORUS: Phosphorus: 2.8 mg/dL (ref 2.5–4.6)

## 2019-06-19 LAB — CK: Total CK: 74 U/L (ref 49–397)

## 2019-06-19 LAB — D-DIMER, QUANTITATIVE: D-Dimer, Quant: 1.58 ug/mL-FEU — ABNORMAL HIGH (ref 0.00–0.50)

## 2019-06-19 LAB — FERRITIN: Ferritin: 1023 ng/mL — ABNORMAL HIGH (ref 24–336)

## 2019-06-19 LAB — TRIGLYCERIDES: Triglycerides: 40 mg/dL (ref <150)

## 2019-06-19 LAB — MAGNESIUM: Magnesium: 2 mg/dL (ref 1.7–2.4)

## 2019-06-19 MED ORDER — TOCILIZUMAB 400 MG/20ML IV SOLN
8.0000 mg/kg | Freq: Once | INTRAVENOUS | Status: AC
  Administered 2019-06-19: 10:00:00 660 mg via INTRAVENOUS
  Filled 2019-06-19: qty 33

## 2019-06-19 NOTE — Progress Notes (Signed)
Notified on phys pt sustaining HR 36-38 while sleeping- no new orders- BP stable whaen awakenesd HR climbs to mid 40's- he denies symptoms.

## 2019-06-19 NOTE — Progress Notes (Signed)
[facility] PROGRESS NOTE    Progress Note  Jason [name]  PNT:[id number] DOB: February 18, 1941 DOA: 06/16/2019 PCP: Lujean Amel, MD     Brief Narrative:   Jason [name] is an 78 y.o. male past medical history of COPD hypertension recent PE diagnosed June 2020 on Xarelto presented with worsening shortness of breath for 1 week.  To the hospital complaining of right-sided pleuritic chest pain and a nonproductive cough was found to have a chest x-ray showing bilateral infiltrates requiring 5 L of oxygen.  He SARS-CoV-2 PCR was positive he was started empirically on IV Remdesivir and Solu-Medrol. Patient is very hard of hearing even with his hearing aids Symptoms started 8.5.2020 Medications: 06/16/2019 IV Remdesivir 06/16/2019 IV Solu-Medrol 06/19/2019 Actemra  Assessment/Plan:   Acute respiratory failure with hypoxia due to COVID-19 virus infection: He is requiring 10 L of high flow nasal cannula to keep saturation above 90%.  His oxygen requirements are increasing. Continue IV Remdesivir and steroids Try to keep the patient prone for at least 16 hours a day. Inflammatory markers continue to improve. Procalcitonin was less than 0.1. The treatment plan and use of medications (Actemra) and known side effects were discussed with patient/family, they were clearly explained that there is no proven definitive treatment for COVID-19 infection, any medications used here are based on published clinical articles/anecdotal data which are not peer-reviewed or randomized control trials.  Complete risks and long-term side effects are unknown, however in the best clinical judgment they seem to be of some clinical benefit rather than medical risks.  Patient/family agree with the treatment plan and want to receive the given medications.  Recent diagnosis of PE: Continue Xarelto at current home dose he will need this lifelong.  Positive FOBT: FOBT positive without frank blood had a colonoscopy in 2015 some  polyps and diverticuli.  No signs of overt bleeding in house.    CAD (coronary artery disease): Stable  Hyperlipidemia: Continue statins.   DVT prophylaxis: xarelto Family Communication:none Disposition Plan/Barrier to D/C: once off oxygen Code Status:     Code Status Orders  (From admission, onward)         Start     Ordered   06/16/19 2108  Full code  Continuous     06/16/19 2107        Code Status History    Date Active Date Inactive Code Status Order ID Comments User Context   04/12/2019 1320 04/14/2019 1423 Full Code [id number]  Norval Morton, MD ED   Advance Care Planning Activity        IV Access:    Peripheral IV   Procedures and diagnostic studies:   No results found.   Medical Consultants:    None.  Anti-Infectives:   IV Remdesivir  Subjective:    Jason [name] relates his breathing is not better than yesterday, he relates he feels that he is winded  Objective:    Vitals:   06/18/19 1600 06/18/19 2000 06/19/19 0100 06/19/19 0400  BP: 135/65 132/71 125/72 125/62  Pulse: 66 63 (!) 56 (!) 42  Resp: 19 (!) 25 15 (!) 23  Temp: 98.6 F (37 C) 98.4 F (36.9 C) 98.2 F (36.8 C) 98.2 F (36.8 C)  TempSrc: Oral Oral Oral Oral  SpO2: 96%  94% 97%  Weight:      Height:       SpO2: 97 % O2 Flow Rate (L/min): 10.5 L/min   Intake/Output Summary (Last 24 hours) at 06/19/2019 9326 Last data filed  at 06/18/2019 1800 Gross per 24 hour  Intake 720 ml  Output 600 ml  Net 120 ml   Filed Weights   06/16/19 1233 06/16/19 2120  Weight: 86.2 kg 82.5 kg    Exam: General exam: In no acute distress, hard of hearing Respiratory system: Good air movement and diffuse crackles bilaterally. Cardiovascular system: S1 & S2 heard, RRR. No JVD/ Gastrointestinal system: Abdomen is nondistended, soft and nontender.  Central nervous system: Alert and oriented. No focal neurological deficits. Extremities: No pedal edema. Skin: No rashes, lesions or  ulcers Psychiatry: Judgement and insight appear normal. Mood & affect appropriate.    Data Reviewed:    Labs: Basic Metabolic Panel: Recent Labs  Lab 06/16/19 1301 06/17/19 0744 06/18/19 0104 06/19/19 0300  NA 132* 136 137 140  K 3.5 3.4* 3.4* 4.9  CL 96* 102 102 108  CO2 20* 18* 22 23  GLUCOSE 137* 182* 194* 160*  BUN 30* 23 31* 34*  CREATININE 1.28* 0.95 0.87 0.89  CALCIUM 8.5* 8.4* 8.6* 8.7*  MG  --  1.9 1.9 2.0  PHOS  --  3.6 2.4* 2.8   GFR Estimated Creatinine Clearance: 69.5 mL/min (by C-G formula based on SCr of 0.89 mg/dL). Liver Function Tests: Recent Labs  Lab 06/16/19 1630 06/18/19 0104 06/19/19 0300  AST 35 35 38  ALT 22 27 31   ALKPHOS 47 46 45  BILITOT 0.8 0.4 0.4  PROT 6.7 5.7* 6.3*  ALBUMIN 2.3* 2.1* 2.2*   No results for input(s): LIPASE, AMYLASE in the last 168 hours. No results for input(s): AMMONIA in the last 168 hours. Coagulation profile No results for input(s): INR, PROTIME in the last 168 hours. COVID-19 Labs  Recent Labs    06/16/19 1630  06/17/19 0744 06/18/19 0104 06/19/19 0300  DDIMER  --    < > 2.24* 1.61* 1.58*  FERRITIN  --    < > 1,145* 1,141* 1,023*  LDH 303*  --   --   --   --   CRP  --    < > 20.9* 13.3* 6.8*   < > = values in this interval not displayed.    Lab Results  Component Value Date   SARSCOV2NAA POSITIVE (A) 06/16/2019   SARSCOV2NAA NOT DETECTED 04/12/2019    CBC: Recent Labs  Lab 06/16/19 1301 06/17/19 0744 06/18/19 0104  WBC 5.8 2.8* 8.7  NEUTROABS 5.0  --   --   HGB 13.0 13.0 11.2*  HCT 38.8* 38.7* 33.9*  MCV 100.0 98.5 99.1  PLT 385 378 412*   Cardiac Enzymes: Recent Labs  Lab 06/17/19 0744 06/18/19 0104 06/19/19 0300  CKTOTAL 168 78 74   BNP (last 3 results) No results for input(s): PROBNP in the last 8760 hours. CBG: No results for input(s): GLUCAP in the last 168 hours. D-Dimer: Recent Labs    06/18/19 0104 06/19/19 0300  DDIMER 1.61* 1.58*   Hgb A1c: No results for  input(s): HGBA1C in the last 72 hours. Lipid Profile: Recent Labs    06/18/19 0104 06/19/19 0300  TRIG 32 40   Thyroid function studies: No results for input(s): TSH, T4TOTAL, T3FREE, THYROIDAB in the last 72 hours.  Invalid input(s): FREET3 Anemia work up: Recent Labs    06/18/19 0104 06/19/19 0300  FERRITIN 1,141* 1,023*   Sepsis Labs: Recent Labs  Lab 06/16/19 1301 06/16/19 1515 06/16/19 1630 06/17/19 0744 06/18/19 0104  PROCALCITON  --   --  <0.10 <0.10 <0.10  WBC 5.8  --   --  2.8* 8.7  LATICACIDVEN  --  1.7  --   --   --    Microbiology Recent Results (from the past 240 hour(s))  SARS Coronavirus 2 [facility] order, Performed in [facility] hospital lab) Nasopharyngeal Nasopharyngeal Swab     Status: Abnormal   Collection Time: 06/16/19  1:20 PM   Specimen: Nasopharyngeal Swab  Result Value Ref Range Status   SARS Coronavirus 2 POSITIVE (A) NEGATIVE Final    Comment: RESULT CALLED TO, READ BACK BY AND VERIFIED WITH: Claretta Fraise RN 15:35 06/16/19 ([email or web address]) (NOTE) If result is NEGATIVE SARS-CoV-2 target nucleic acids are NOT DETECTED. The SARS-CoV-2 RNA is generally detectable in upper and lower  respiratory specimens during the acute phase of infection. The lowest  concentration of SARS-CoV-2 viral copies this assay can detect is 250  copies / mL. A negative result does not preclude SARS-CoV-2 infection  and should not be used as the sole basis for treatment or other  patient management decisions.  A negative result may occur with  improper specimen collection / handling, submission of specimen other  than nasopharyngeal swab, presence of viral mutation(s) within the  areas targeted by this assay, and inadequate number of viral copies  (<250 copies / mL). A negative result must be combined with clinical  observations, patient history, and epidemiological information. If result is POSITIVE SARS-CoV-2 target nucleic acids are DETECTED.  The SARS-CoV-2 RNA is  generally detectable in upper and lower  respiratory specimens during the acute phase of infection.  Positive  results are indicative of active infection with SARS-CoV-2.  Clinical  correlation with patient history and other diagnostic information is  necessary to determine patient infection status.  Positive results do  not rule out bacterial infection or co-infection with other viruses. If result is PRESUMPTIVE POSTIVE SARS-CoV-2 nucleic acids MAY BE PRESENT.   A presumptive positive result was obtained on the submitted specimen  and confirmed on repeat testing.  While 2019 novel coronavirus  (SARS-CoV-2) nucleic acids may be present in the submitted sample  additional confirmatory testing may be necessary for epidemiological  and / or clinical management purposes  to differentiate between  SARS-CoV-2 and other Sarbecovirus currently known to infect humans.  If clinically indicated additional testing with an alternate test  methodology [phone])  is advised. The SARS-CoV-2 RNA is generally  detectable in upper and lower respiratory specimens during the acute  phase of infection. The expected result is Negative. Fact Sheet for Patients:  StrictlyIdeas.no Fact Sheet for Healthcare Providers: BankingDealers.co.za This test is not yet approved or cleared by the [place] FDA and has been authorized for detection and/or diagnosis of SARS-CoV-2 by FDA under an Emergency Use Authorization (EUA).  This EUA will remain in effect (meaning this test can be used) for the duration of the COVID-19 declaration under Section 564(b)(1) of the Act, 21 U.S.C. section 360bbb-3(b)(1), unless the authorization is terminated or revoked sooner. Performed at [facility], [place] [address], [place], [place] [address]   Culture, blood (routine x 2)     Status: None (Preliminary result)   Collection Time: 06/16/19  3:00 PM   Specimen: BLOOD RIGHT WRIST   Result Value Ref Range Status   Specimen Description BLOOD RIGHT WRIST  Final   Special Requests   Final    BOTTLES DRAWN AEROBIC AND ANAEROBIC Blood Culture adequate volume   Culture   Final    NO GROWTH 2 DAYS Performed at [facility], [place]  [address], [place], [place] [address]    Report Status PENDING  Incomplete  Culture, blood (routine x 2)     Status: None (Preliminary result)   Collection Time: 06/16/19  3:05 PM   Specimen: BLOOD  Result Value Ref Range Status   Specimen Description BLOOD LEFT ANTECUBITAL  Final   Special Requests   Final    BOTTLES DRAWN AEROBIC AND ANAEROBIC Blood Culture adequate volume   Culture   Final    NO GROWTH 2 DAYS Performed at [facility], [place] [address], [place], [place] [address]    Report Status PENDING  Incomplete  Respiratory Panel by PCR     Status: None   Collection Time: 06/16/19  9:55 PM   Specimen: Nasopharyngeal Swab; Respiratory  Result Value Ref Range Status   Adenovirus NOT DETECTED NOT DETECTED Final   Coronavirus 229E NOT DETECTED NOT DETECTED Final    Comment: (NOTE) The Coronavirus on the Respiratory Panel, DOES NOT test for the novel  Coronavirus (2019 nCoV)    Coronavirus HKU1 NOT DETECTED NOT DETECTED Final   Coronavirus NL63 NOT DETECTED NOT DETECTED Final   Coronavirus OC43 NOT DETECTED NOT DETECTED Final   Metapneumovirus NOT DETECTED NOT DETECTED Final   Rhinovirus / Enterovirus NOT DETECTED NOT DETECTED Final   Influenza A NOT DETECTED NOT DETECTED Final   Influenza B NOT DETECTED NOT DETECTED Final   Parainfluenza Virus 1 NOT DETECTED NOT DETECTED Final   Parainfluenza Virus 2 NOT DETECTED NOT DETECTED Final   Parainfluenza Virus 3 NOT DETECTED NOT DETECTED Final   Parainfluenza Virus 4 NOT DETECTED NOT DETECTED Final   Respiratory Syncytial Virus NOT DETECTED NOT DETECTED Final   Bordetella pertussis NOT DETECTED NOT DETECTED Final   Chlamydophila pneumoniae NOT DETECTED NOT DETECTED Final    Mycoplasma pneumoniae NOT DETECTED NOT DETECTED Final    Comment: Performed at [facility], [place]. [address], [place], [place] [address]  C difficile quick scan w PCR reflex     Status: None   Collection Time: 06/16/19 10:05 PM   Specimen: STOOL  Result Value Ref Range Status   C Diff antigen NEGATIVE NEGATIVE Final   C Diff toxin NEGATIVE NEGATIVE Final   C Diff interpretation No C. difficile detected.  Final    Comment: Performed at [facility], [place] [address], [place], [place] [address]     Medications:   . albuterol  2 puff Inhalation Q6H  . cholecalciferol  2,000 Units Oral Daily  . dexamethasone  6 mg Oral Daily  . multivitamin with minerals  1 tablet Oral Daily  . pravastatin  80 mg Oral QHS  . rivaroxaban  20 mg Oral QPC supper  . traZODone  100 mg Oral QHS   Continuous Infusions: . remdesivir 100 mg in NS 250 mL 100 mg (06/18/19 2205)     LOS: 3 days   Charlynne Cousins  [facility]  06/19/2019, 7:31 AM

## 2019-06-20 LAB — PHOSPHORUS: Phosphorus: 2.7 mg/dL (ref 2.5–4.6)

## 2019-06-20 LAB — COMPREHENSIVE METABOLIC PANEL
ALT: 116 U/L — ABNORMAL HIGH (ref 0–44)
AST: 121 U/L — ABNORMAL HIGH (ref 15–41)
Albumin: 2.3 g/dL — ABNORMAL LOW (ref 3.5–5.0)
Alkaline Phosphatase: 49 U/L (ref 38–126)
Anion gap: 8 (ref 5–15)
BUN: 33 mg/dL — ABNORMAL HIGH (ref 8–23)
CO2: 24 mmol/L (ref 22–32)
Calcium: 8.6 mg/dL — ABNORMAL LOW (ref 8.9–10.3)
Chloride: 109 mmol/L (ref 98–111)
Creatinine, Ser: 0.79 mg/dL (ref 0.61–1.24)
GFR calc Af Amer: >60 mL/min (ref >60)
GFR calc non Af Amer: >60 mL/min (ref >60)
Glucose, Bld: 127 mg/dL — ABNORMAL HIGH (ref 70–99)
Potassium: 4.3 mmol/L (ref 3.5–5.1)
Sodium: 141 mmol/L (ref 135–145)
Total Bilirubin: 0.3 mg/dL (ref 0.3–1.2)
Total Protein: 6 g/dL — ABNORMAL LOW (ref 6.5–8.1)

## 2019-06-20 LAB — FERRITIN: Ferritin: 838 ng/mL — ABNORMAL HIGH (ref 24–336)

## 2019-06-20 LAB — TRIGLYCERIDES: Triglycerides: 45 mg/dL (ref <150)

## 2019-06-20 LAB — C-REACTIVE PROTEIN: CRP: 3.8 mg/dL — ABNORMAL HIGH (ref <1.0)

## 2019-06-20 LAB — INTERLEUKIN-6, PLASMA: Interleukin-6, Plasma: 3.1 pg/mL (ref 0.0–12.2)

## 2019-06-20 LAB — MAGNESIUM: Magnesium: 1.9 mg/dL (ref 1.7–2.4)

## 2019-06-20 LAB — D-DIMER, QUANTITATIVE: D-Dimer, Quant: 5.38 ug/mL-FEU — ABNORMAL HIGH (ref 0.00–0.50)

## 2019-06-20 LAB — CK: Total CK: 30 U/L — ABNORMAL LOW (ref 49–397)

## 2019-06-20 NOTE — Progress Notes (Signed)
Called pt son Leory Plowman for updates. No answer

## 2019-06-20 NOTE — Progress Notes (Signed)
Jason [name] called me back. Updated

## 2019-06-20 NOTE — Progress Notes (Signed)
[facility] PROGRESS NOTE    Progress Note  Jason [name]  VFI:[id number] DOB: Jan 23, 1941 DOA: 06/16/2019 PCP: Lujean Amel, MD     Brief Narrative:   Jason [name] is an 78 y.o. male past medical history of COPD hypertension recent PE diagnosed June 2020 on Xarelto, hard of hearing presented with worsening shortness of breath for 1 week.  To the hospital complaining of right-sided pleuritic chest pain and a nonproductive cough was found to have a chest x-ray showing bilateral infiltrates requiring 5 L of oxygen.  He SARS-CoV-2 PCR was positive he was started empirically on IV Remdesivir and Solu-Medrol. Patient is very hard of hearing even with his hearing aids Symptoms started 8.5.2020 Medications: 06/16/2019 IV Remdesivir 06/16/2019 IV Solu-Medrol 06/19/2019 Actemra  Assessment/Plan:   Acute respiratory failure with hypoxia due to COVID-19 virus infection: He is currently requiring 10 L of high flow nasal cannula to keep saturations above 90%. Continue IV Remdesivir and steroids. He is requiring 10 L of high flow nasal cannula to keep saturations above 90%. Patient needs to prone. To keep the patient prone for at least 16 hours a day.  His procalcitonin is less than 0.1. The treatment plan and use of medications (Actemra) and known side effects were discussed with patient/family, they were clearly explained that there is no proven definitive treatment for COVID-19 infection, any medications used here are based on published clinical articles/anecdotal data which are not peer-reviewed or randomized control trials.  Complete risks and long-term side effects are unknown, however in the best clinical judgment they seem to be of some clinical benefit rather than medical risks.  Patient/family agree with the treatment plan and want to receive the given medications.  Recent diagnosis of PE: Continue Xarelto at current home dose will need to be on lifelong Xarelto.  Positive FOBT: FOBT  positive without frank blood last colonoscopy in 2015 showed polyp and diverticuli no signs of overt bleeding.    CAD (coronary artery disease): Stable  Hyperlipidemia: Continue statins.   DVT prophylaxis: xarelto Family Communication:none Disposition Plan/Barrier to D/C: once off oxygen Code Status:     Code Status Orders  (From admission, onward)         Start     Ordered   06/16/19 2108  Full code  Continuous     06/16/19 2107        Code Status History    Date Active Date Inactive Code Status Order ID Comments User Context   04/12/2019 1320 04/14/2019 1423 Full Code [id number]  Norval Morton, MD ED   Advance Care Planning Activity        IV Access:    Peripheral IV   Procedures and diagnostic studies:   No results found.   Medical Consultants:    None.  Anti-Infectives:   IV Remdesivir  Subjective:    Jason [name] relates his breathing is about the same.  He said he does not have an appetite.  Objective:    Vitals:   06/19/19 0757 06/19/19 2048 06/20/19 0340 06/20/19 0733  BP: 133/69 134/80 133/64 (!) 133/111  Pulse: (!) 45     Resp: (!) 23     Temp: (!) 97.5 F (36.4 C) 98.7 F (37.1 C) 97.6 F (36.4 C) 97.7 F (36.5 C)  TempSrc: Oral  Oral Oral  SpO2: 95%     Weight:      Height:       SpO2: 95 % O2 Flow Rate (L/min): 10.5 L/min  Intake/Output Summary (Last 24 hours) at 06/20/2019 0805 Last data filed at 06/20/2019 0700 Gross per 24 hour  Intake 725.43 ml  Output 775 ml  Net -49.57 ml   Filed Weights   06/16/19 1233 06/16/19 2120  Weight: 86.2 kg 82.5 kg    Exam: General exam: In no acute distress. Respiratory system: Good air movement and diffuse crackles bilaterally the tip of his nose is cyanotic Cardiovascular system: S1 & S2 heard, RRR. No JVD. Gastrointestinal system: Abdomen is nondistended, soft and nontender.  Central nervous system: Alert and oriented. No focal neurological deficits. Extremities: No pedal  edema. Skin: No rashes, lesions or ulcers Psychiatry: Judgement and insight appear normal. Mood & affect appropriate.    Data Reviewed:    Labs: Basic Metabolic Panel: Recent Labs  Lab 06/16/19 1301 06/17/19 0744 06/18/19 0104 06/19/19 0300 06/20/19 0117  NA 132* 136 137 140 141  K 3.5 3.4* 3.4* 4.9 4.3  CL 96* 102 102 108 109  CO2 20* 18* 22 23 24   GLUCOSE 137* 182* 194* 160* 127*  BUN 30* 23 31* 34* 33*  CREATININE 1.28* 0.95 0.87 0.89 0.79  CALCIUM 8.5* 8.4* 8.6* 8.7* 8.6*  MG  --  1.9 1.9 2.0 1.9  PHOS  --  3.6 2.4* 2.8 2.7   GFR Estimated Creatinine Clearance: 77.3 mL/min (by C-G formula based on SCr of 0.79 mg/dL). Liver Function Tests: Recent Labs  Lab 06/16/19 1630 06/18/19 0104 06/19/19 0300 06/20/19 0117  AST 35 35 38 121*  ALT 22 27 31  116*  ALKPHOS 47 46 45 49  BILITOT 0.8 0.4 0.4 0.3  PROT 6.7 5.7* 6.3* 6.0*  ALBUMIN 2.3* 2.1* 2.2* 2.3*   No results for input(s): LIPASE, AMYLASE in the last 168 hours. No results for input(s): AMMONIA in the last 168 hours. Coagulation profile No results for input(s): INR, PROTIME in the last 168 hours. COVID-19 Labs  Recent Labs    06/18/19 0104 06/19/19 0300 06/20/19 0117  DDIMER 1.61* 1.58* 5.38*  FERRITIN 1,141* 1,023* 838*  CRP 13.3* 6.8* 3.8*    Lab Results  Component Value Date   SARSCOV2NAA POSITIVE (A) 06/16/2019   SARSCOV2NAA NOT DETECTED 04/12/2019    CBC: Recent Labs  Lab 06/16/19 1301 06/17/19 0744 06/18/19 0104  WBC 5.8 2.8* 8.7  NEUTROABS 5.0  --   --   HGB 13.0 13.0 11.2*  HCT 38.8* 38.7* 33.9*  MCV 100.0 98.5 99.1  PLT 385 378 412*   Cardiac Enzymes: Recent Labs  Lab 06/17/19 0744 06/18/19 0104 06/19/19 0300 06/20/19 0117  CKTOTAL 168 78 74 30*   BNP (last 3 results) No results for input(s): PROBNP in the last 8760 hours. CBG: No results for input(s): GLUCAP in the last 168 hours. D-Dimer: Recent Labs    06/19/19 0300 06/20/19 0117  DDIMER 1.58* 5.38*   Hgb  A1c: No results for input(s): HGBA1C in the last 72 hours. Lipid Profile: Recent Labs    06/19/19 0300 06/20/19 0117  TRIG 40 45   Thyroid function studies: No results for input(s): TSH, T4TOTAL, T3FREE, THYROIDAB in the last 72 hours.  Invalid input(s): FREET3 Anemia work up: Recent Labs    06/19/19 0300 06/20/19 0117  FERRITIN 1,023* 838*   Sepsis Labs: Recent Labs  Lab 06/16/19 1301 06/16/19 1515 06/16/19 1630 06/17/19 0744 06/18/19 0104  PROCALCITON  --   --  <0.10 <0.10 <0.10  WBC 5.8  --   --  2.8* 8.7  LATICACIDVEN  --  1.7  --   --   --  Microbiology Recent Results (from the past 240 hour(s))  SARS Coronavirus 2 [facility] order, Performed in [facility] hospital lab) Nasopharyngeal Nasopharyngeal Swab     Status: Abnormal   Collection Time: 06/16/19  1:20 PM   Specimen: Nasopharyngeal Swab  Result Value Ref Range Status   SARS Coronavirus 2 POSITIVE (A) NEGATIVE Final    Comment: RESULT CALLED TO, READ BACK BY AND VERIFIED WITH: Claretta Fraise RN 15:35 06/16/19 ([email or web address]) (NOTE) If result is NEGATIVE SARS-CoV-2 target nucleic acids are NOT DETECTED. The SARS-CoV-2 RNA is generally detectable in upper and lower  respiratory specimens during the acute phase of infection. The lowest  concentration of SARS-CoV-2 viral copies this assay can detect is 250  copies / mL. A negative result does not preclude SARS-CoV-2 infection  and should not be used as the sole basis for treatment or other  patient management decisions.  A negative result may occur with  improper specimen collection / handling, submission of specimen other  than nasopharyngeal swab, presence of viral mutation(s) within the  areas targeted by this assay, and inadequate number of viral copies  (<250 copies / mL). A negative result must be combined with clinical  observations, patient history, and epidemiological information. If result is POSITIVE SARS-CoV-2 target nucleic acids are DETECTED.  The  SARS-CoV-2 RNA is generally detectable in upper and lower  respiratory specimens during the acute phase of infection.  Positive  results are indicative of active infection with SARS-CoV-2.  Clinical  correlation with patient history and other diagnostic information is  necessary to determine patient infection status.  Positive results do  not rule out bacterial infection or co-infection with other viruses. If result is PRESUMPTIVE POSTIVE SARS-CoV-2 nucleic acids MAY BE PRESENT.   A presumptive positive result was obtained on the submitted specimen  and confirmed on repeat testing.  While 2019 novel coronavirus  (SARS-CoV-2) nucleic acids may be present in the submitted sample  additional confirmatory testing may be necessary for epidemiological  and / or clinical management purposes  to differentiate between  SARS-CoV-2 and other Sarbecovirus currently known to infect humans.  If clinically indicated additional testing with an alternate test  methodology [phone])  is advised. The SARS-CoV-2 RNA is generally  detectable in upper and lower respiratory specimens during the acute  phase of infection. The expected result is Negative. Fact Sheet for Patients:  StrictlyIdeas.no Fact Sheet for Healthcare Providers: BankingDealers.co.za This test is not yet approved or cleared by the [place] FDA and has been authorized for detection and/or diagnosis of SARS-CoV-2 by FDA under an Emergency Use Authorization (EUA).  This EUA will remain in effect (meaning this test can be used) for the duration of the COVID-19 declaration under Section 564(b)(1) of the Act, 21 U.S.C. section 360bbb-3(b)(1), unless the authorization is terminated or revoked sooner. Performed at [facility], [place] [address] [place], [place] [address]   Culture, blood (routine x 2)     Status: None (Preliminary result)   Collection Time: 06/16/19  3:00 PM   Specimen:  BLOOD RIGHT WRIST  Result Value Ref Range Status   Specimen Description BLOOD RIGHT WRIST  Final   Special Requests   Final    BOTTLES DRAWN AEROBIC AND ANAEROBIC Blood Culture adequate volume   Culture   Final    NO GROWTH 3 DAYS Performed at [facility], [address], [place], [place] [address]    Report Status PENDING  Incomplete  Culture, blood (routine x 2)  Status: None (Preliminary result)   Collection Time: 06/16/19  3:05 PM   Specimen: BLOOD  Result Value Ref Range Status   Specimen Description BLOOD LEFT ANTECUBITAL  Final   Special Requests   Final    BOTTLES DRAWN AEROBIC AND ANAEROBIC Blood Culture adequate volume   Culture   Final    NO GROWTH 3 DAYS Performed at [facility], [address] [place], [place] [address]    Report Status PENDING  Incomplete  Respiratory Panel by PCR     Status: None   Collection Time: 06/16/19  9:55 PM   Specimen: Nasopharyngeal Swab; Respiratory  Result Value Ref Range Status   Adenovirus NOT DETECTED NOT DETECTED Final   Coronavirus 229E NOT DETECTED NOT DETECTED Final    Comment: (NOTE) The Coronavirus on the Respiratory Panel, DOES NOT test for the novel  Coronavirus (2019 nCoV)    Coronavirus HKU1 NOT DETECTED NOT DETECTED Final   Coronavirus NL63 NOT DETECTED NOT DETECTED Final   Coronavirus OC43 NOT DETECTED NOT DETECTED Final   Metapneumovirus NOT DETECTED NOT DETECTED Final   Rhinovirus / Enterovirus NOT DETECTED NOT DETECTED Final   Influenza A NOT DETECTED NOT DETECTED Final   Influenza B NOT DETECTED NOT DETECTED Final   Parainfluenza Virus 1 NOT DETECTED NOT DETECTED Final   Parainfluenza Virus 2 NOT DETECTED NOT DETECTED Final   Parainfluenza Virus 3 NOT DETECTED NOT DETECTED Final   Parainfluenza Virus 4 NOT DETECTED NOT DETECTED Final   Respiratory Syncytial Virus NOT DETECTED NOT DETECTED Final   Bordetella pertussis NOT DETECTED NOT DETECTED Final   Chlamydophila pneumoniae NOT DETECTED NOT  DETECTED Final   Mycoplasma pneumoniae NOT DETECTED NOT DETECTED Final    Comment: Performed at [facility], [place]. [address], [place], [place] [address]  C difficile quick scan w PCR reflex     Status: None   Collection Time: 06/16/19 10:05 PM   Specimen: STOOL  Result Value Ref Range Status   C Diff antigen NEGATIVE NEGATIVE Final   C Diff toxin NEGATIVE NEGATIVE Final   C Diff interpretation No C. difficile detected.  Final    Comment: Performed at [facility], [place] [address] [place], [place] [address]     Medications:   . albuterol  2 puff Inhalation Q6H  . cholecalciferol  2,000 Units Oral Daily  . dexamethasone  6 mg Oral Daily  . multivitamin with minerals  1 tablet Oral Daily  . pravastatin  80 mg Oral QHS  . rivaroxaban  20 mg Oral QPC supper  . traZODone  100 mg Oral QHS   Continuous Infusions: . remdesivir 100 mg in NS 250 mL Stopped (06/19/19 2130)     LOS: 4 days   Charlynne Cousins  [facility]  06/20/2019, 8:05 AM

## 2019-06-20 NOTE — Plan of Care (Signed)
Pt had uneventful shift. Pt refused to lie prone but was agreeable to lay from side to side. Pt O2 requirements decreased to 8L HFNC from 10L this morning. UOP adequate. VSS. No c/o pain. Pt had BM today. Tolerating diet. Remained free from injury. Call bell within reach. Encouraged to call for assistance. Will continue plan of care.  Problem: Education: Goal: Knowledge of General Education information will improve Description: Including pain rating scale, medication(s)/side effects and non-pharmacologic comfort measures Outcome: Progressing

## 2019-06-21 LAB — CULTURE, BLOOD (ROUTINE X 2)
Culture: NO GROWTH
Culture: NO GROWTH
Special Requests: ADEQUATE
Special Requests: ADEQUATE

## 2019-06-21 LAB — D-DIMER, QUANTITATIVE: D-Dimer, Quant: >20 ug/mL-FEU — ABNORMAL HIGH (ref 0.00–0.50)

## 2019-06-21 LAB — CK: Total CK: 20 U/L — ABNORMAL LOW (ref 49–397)

## 2019-06-21 LAB — TRIGLYCERIDES: Triglycerides: 48 mg/dL (ref <150)

## 2019-06-21 LAB — C-REACTIVE PROTEIN: CRP: 1.7 mg/dL — ABNORMAL HIGH (ref <1.0)

## 2019-06-21 LAB — INTERLEUKIN-6, PLASMA
Interleukin-6, Plasma: 4.1 pg/mL (ref 0.0–12.2)
Interleukin-6, Plasma: 183.9 pg/mL — ABNORMAL HIGH (ref 0.0–12.2)

## 2019-06-21 LAB — PHOSPHORUS: Phosphorus: 3.4 mg/dL (ref 2.5–4.6)

## 2019-06-21 LAB — MAGNESIUM: Magnesium: 1.9 mg/dL (ref 1.7–2.4)

## 2019-06-21 NOTE — Progress Notes (Signed)
Spoke with son concerning his dads progress.  Discussed current oxygen requirements and discharge barriers.  Son is concerned that he needs his dad to test negative for COVID-19 per CDC guidelines prior to being able to bring him home.  Concerned about his other family and work

## 2019-06-21 NOTE — Progress Notes (Signed)
[facility] PROGRESS NOTE    Progress Note  Tyrique Sporn  WUJ:[id number] DOB: [birth year] DOA: 06/16/2019 PCP: Lujean Amel, MD     Brief Narrative:   Donevin Sainsbury is an 78 y.o. male past medical history of COPD hypertension recent PE diagnosed June 2020 on Xarelto, hard of hearing presented with worsening shortness of breath for 1 week.  To the hospital complaining of right-sided pleuritic chest pain and a nonproductive cough was found to have a chest x-ray showing bilateral infiltrates requiring 5 L of oxygen.  He SARS-CoV-2 PCR was positive he was started empirically on IV Remdesivir and Solu-Medrol. Patient is very hard of hearing even with his hearing aids Symptoms started 8.5.2020 Medications: 06/16/2019 IV Remdesivir 06/16/2019 IV Solu-Medrol 06/19/2019 Actemra  Assessment/Plan:   Acute respiratory failure with hypoxia due to COVID-19 virus infection: He was ambulated today, his oxygen requirement seems to be improving.  He is currently on 10 L high flow nasal cannula and is saturating greater 93%, he will complete his 5 days of IV Remdesivir, continue oral dexamethasone.  His inflammatory markers are improving. Try to prone patient for at least 16 hours a day. He received a dose of Actemra. His nose does not appear cyanotic today.   Recent diagnosis of PE: Continue Xarelto at current home dose will need to be on lifelong Xarelto.  Positive FOBT: FOBT positive without frank blood last colonoscopy in 2015 showed polyp and diverticuli no signs of overt bleeding.    CAD (coronary artery disease): Stable  Hyperlipidemia: Continue statins.   DVT prophylaxis: xarelto Family Communication:none Disposition Plan/Barrier to D/C: once off oxygen Code Status:     Code Status Orders  (From admission, onward)         Start     Ordered   06/16/19 2108  Full code  Continuous     06/16/19 2107        Code Status History    Date Active Date Inactive Code Status Order  ID Comments User Context   04/12/2019 1320 04/14/2019 1423 Full Code [id number]  Norval Morton, MD ED   Advance Care Planning Activity        IV Access:    Peripheral IV   Procedures and diagnostic studies:   No results found.   Medical Consultants:    None.  Anti-Infectives:   IV Remdesivir  Subjective:    Anes Zuno relates his breathing is better compared to yesterday.  Appetite is improved.  Objective:    Vitals:   06/20/19 1000 06/20/19 1635 06/20/19 2003 06/21/19 0359  BP:  135/77 125/71 (!) 148/71  Pulse:  (!) 53 (!) 49 64  Resp:  (!) 23 (!) 24 (!) 21  Temp:  98.1 F (36.7 C) 98.8 F (37.1 C) 98.2 F (36.8 C)  TempSrc:   Oral   SpO2: 91% 96% 93% 95%  Weight:      Height:       SpO2: 95 % O2 Flow Rate (L/min): 10 L/min   Intake/Output Summary (Last 24 hours) at 06/21/2019 0648 Last data filed at 06/20/2019 1800 Gross per 24 hour  Intake 640 ml  Output 100 ml  Net 540 ml   Filed Weights   06/16/19 1233 06/16/19 2120  Weight: 86.2 kg 82.5 kg    Exam: General exam: In no acute distress. Respiratory system: Good air movement and diffuse crackles bilaterally Cardiovascular system: S1 & S2 heard, RRR. No JVD. Gastrointestinal system: Abdomen is nondistended, soft and nontender.  Central  nervous system: Alert and oriented. No focal neurological deficits. Extremities: No pedal edema. Skin: No rashes, lesions or ulcers    Data Reviewed:    Labs: Basic Metabolic Panel: Recent Labs  Lab 06/16/19 1301 06/17/19 0744 06/18/19 0104 06/19/19 0300 06/20/19 0117  NA 132* 136 137 140 141  K 3.5 3.4* 3.4* 4.9 4.3  CL 96* 102 102 108 109  CO2 20* 18* 22 23 24   GLUCOSE 137* 182* 194* 160* 127*  BUN 30* 23 31* 34* 33*  CREATININE 1.28* 0.95 0.87 0.89 0.79  CALCIUM 8.5* 8.4* 8.6* 8.7* 8.6*  MG  --  1.9 1.9 2.0 1.9  PHOS  --  3.6 2.4* 2.8 2.7   GFR Estimated Creatinine Clearance: 77.3 mL/min (by C-G formula based on SCr of 0.79 mg/dL).  Liver Function Tests: Recent Labs  Lab 06/16/19 1630 06/18/19 0104 06/19/19 0300 06/20/19 0117  AST 35 35 38 121*  ALT 22 27 31  116*  ALKPHOS 47 46 45 49  BILITOT 0.8 0.4 0.4 0.3  PROT 6.7 5.7* 6.3* 6.0*  ALBUMIN 2.3* 2.1* 2.2* 2.3*   No results for input(s): LIPASE, AMYLASE in the last 168 hours. No results for input(s): AMMONIA in the last 168 hours. Coagulation profile No results for input(s): INR, PROTIME in the last 168 hours. COVID-19 Labs  Recent Labs    06/19/19 0300 06/20/19 0117  DDIMER 1.58* 5.38*  FERRITIN 1,023* 838*  CRP 6.8* 3.8*    Lab Results  Component Value Date   SARSCOV2NAA POSITIVE (A) 06/16/2019   SARSCOV2NAA NOT DETECTED 04/12/2019    CBC: Recent Labs  Lab 06/16/19 1301 06/17/19 0744 06/18/19 0104  WBC 5.8 2.8* 8.7  NEUTROABS 5.0  --   --   HGB 13.0 13.0 11.2*  HCT 38.8* 38.7* 33.9*  MCV 100.0 98.5 99.1  PLT 385 378 412*   Cardiac Enzymes: Recent Labs  Lab 06/17/19 0744 06/18/19 0104 06/19/19 0300 06/20/19 0117  CKTOTAL 168 78 74 30*   BNP (last 3 results) No results for input(s): PROBNP in the last 8760 hours. CBG: No results for input(s): GLUCAP in the last 168 hours. D-Dimer: Recent Labs    06/19/19 0300 06/20/19 0117  DDIMER 1.58* 5.38*   Hgb A1c: No results for input(s): HGBA1C in the last 72 hours. Lipid Profile: Recent Labs    06/19/19 0300 06/20/19 0117  TRIG 40 45   Thyroid function studies: No results for input(s): TSH, T4TOTAL, T3FREE, THYROIDAB in the last 72 hours.  Invalid input(s): FREET3 Anemia work up: Recent Labs    06/19/19 0300 06/20/19 0117  FERRITIN 1,023* 838*   Sepsis Labs: Recent Labs  Lab 06/16/19 1301 06/16/19 1515 06/16/19 1630 06/17/19 0744 06/18/19 0104  PROCALCITON  --   --  <0.10 <0.10 <0.10  WBC 5.8  --   --  2.8* 8.7  LATICACIDVEN  --  1.7  --   --   --    Microbiology Recent Results (from the past 240 hour(s))  SARS Coronavirus 2 [facility] order, Performed  in [facility] hospital lab) Nasopharyngeal Nasopharyngeal Swab     Status: Abnormal   Collection Time: 06/16/19  1:20 PM   Specimen: Nasopharyngeal Swab  Result Value Ref Range Status   SARS Coronavirus 2 POSITIVE (A) NEGATIVE Final    Comment: RESULT CALLED TO, READ BACK BY AND VERIFIED WITH: Claretta Fraise RN 15:35 06/16/19 ([email or web address]) (NOTE) If result is NEGATIVE SARS-CoV-2 target nucleic acids are NOT DETECTED. The SARS-CoV-2 RNA is generally detectable in  upper and lower  respiratory specimens during the acute phase of infection. The lowest  concentration of SARS-CoV-2 viral copies this assay can detect is 250  copies / mL. A negative result does not preclude SARS-CoV-2 infection  and should not be used as the sole basis for treatment or other  patient management decisions.  A negative result may occur with  improper specimen collection / handling, submission of specimen other  than nasopharyngeal swab, presence of viral mutation(s) within the  areas targeted by this assay, and inadequate number of viral copies  (<250 copies / mL). A negative result must be combined with clinical  observations, patient history, and epidemiological information. If result is POSITIVE SARS-CoV-2 target nucleic acids are DETECTED.  The SARS-CoV-2 RNA is generally detectable in upper and lower  respiratory specimens during the acute phase of infection.  Positive  results are indicative of active infection with SARS-CoV-2.  Clinical  correlation with patient history and other diagnostic information is  necessary to determine patient infection status.  Positive results do  not rule out bacterial infection or co-infection with other viruses. If result is PRESUMPTIVE POSTIVE SARS-CoV-2 nucleic acids MAY BE PRESENT.   A presumptive positive result was obtained on the submitted specimen  and confirmed on repeat testing.  While 2019 novel coronavirus  (SARS-CoV-2) nucleic acids may be present in the submitted  sample  additional confirmatory testing may be necessary for epidemiological  and / or clinical management purposes  to differentiate between  SARS-CoV-2 and other Sarbecovirus currently known to infect humans.  If clinically indicated additional testing with an alternate test  methodology [id number])  is advised. The SARS-CoV-2 RNA is generally  detectable in upper and lower respiratory specimens during the acute  phase of infection. The expected result is Negative. Fact Sheet for Patients:  StrictlyIdeas.no Fact Sheet for Healthcare Providers: BankingDealers.co.za This test is not yet approved or cleared by the [place] FDA and has been authorized for detection and/or diagnosis of SARS-CoV-2 by FDA under an Emergency Use Authorization (EUA).  This EUA will remain in effect (meaning this test can be used) for the duration of the COVID-19 declaration under Section 564(b)(1) of the Act, 21 U.S.C. section 360bbb-3(b)(1), unless the authorization is terminated or revoked sooner. Performed at [facility], [place] [address], [place], [place] [address]   Culture, blood (routine x 2)     Status: None (Preliminary result)   Collection Time: 06/16/19  3:00 PM   Specimen: BLOOD RIGHT WRIST  Result Value Ref Range Status   Specimen Description BLOOD RIGHT WRIST  Final   Special Requests   Final    BOTTLES DRAWN AEROBIC AND ANAEROBIC Blood Culture adequate volume   Culture   Final    NO GROWTH 4 DAYS Performed at [facility], [place] [address] [place], [place] [address]    Report Status PENDING  Incomplete  Culture, blood (routine x 2)     Status: None (Preliminary result)   Collection Time: 06/16/19  3:05 PM   Specimen: BLOOD  Result Value Ref Range Status   Specimen Description BLOOD LEFT ANTECUBITAL  Final   Special Requests   Final    BOTTLES DRAWN AEROBIC AND ANAEROBIC Blood Culture adequate volume   Culture   Final    NO  GROWTH 4 DAYS Performed at [facility], [place] [address] [place], [place] [address]    Report Status PENDING  Incomplete  Respiratory Panel by PCR  Status: None   Collection Time: 06/16/19  9:55 PM   Specimen: Nasopharyngeal Swab; Respiratory  Result Value Ref Range Status   Adenovirus NOT DETECTED NOT DETECTED Final   Coronavirus 229E NOT DETECTED NOT DETECTED Final    Comment: (NOTE) The Coronavirus on the Respiratory Panel, DOES NOT test for the novel  Coronavirus (2019 nCoV)    Coronavirus HKU1 NOT DETECTED NOT DETECTED Final   Coronavirus NL63 NOT DETECTED NOT DETECTED Final   Coronavirus OC43 NOT DETECTED NOT DETECTED Final   Metapneumovirus NOT DETECTED NOT DETECTED Final   Rhinovirus / Enterovirus NOT DETECTED NOT DETECTED Final   Influenza A NOT DETECTED NOT DETECTED Final   Influenza B NOT DETECTED NOT DETECTED Final   Parainfluenza Virus 1 NOT DETECTED NOT DETECTED Final   Parainfluenza Virus 2 NOT DETECTED NOT DETECTED Final   Parainfluenza Virus 3 NOT DETECTED NOT DETECTED Final   Parainfluenza Virus 4 NOT DETECTED NOT DETECTED Final   Respiratory Syncytial Virus NOT DETECTED NOT DETECTED Final   Bordetella pertussis NOT DETECTED NOT DETECTED Final   Chlamydophila pneumoniae NOT DETECTED NOT DETECTED Final   Mycoplasma pneumoniae NOT DETECTED NOT DETECTED Final    Comment: Performed at [facility], [place]. [address] [place], [place] [address]  C difficile quick scan w PCR reflex     Status: None   Collection Time: 06/16/19 10:05 PM   Specimen: STOOL  Result Value Ref Range Status   C Diff antigen NEGATIVE NEGATIVE Final   C Diff toxin NEGATIVE NEGATIVE Final   C Diff interpretation No C. difficile detected.  Final    Comment: Performed at [facility], [place] [address], [place], [place] [address]     Medications:   . albuterol  2 puff Inhalation Q6H  . cholecalciferol  2,000 Units Oral Daily  . dexamethasone  6 mg Oral Daily  . multivitamin  with minerals  1 tablet Oral Daily  . pravastatin  80 mg Oral QHS  . rivaroxaban  20 mg Oral QPC supper  . traZODone  100 mg Oral QHS   Continuous Infusions:    LOS: 5 days   Charlynne Cousins  [facility]  06/21/2019, 6:48 AM

## 2019-06-21 NOTE — Evaluation (Signed)
Physical Therapy Evaluation Patient Details Name: Jason [name] MRN: [id number] DOB: 1941/06/22 Today's Date: 06/21/2019   History of Present Illness  Jason [name] is an 78 y.o. male past medical history of COPD hypertension recent PE diagnosed June 2020 on Xarelto, hard of hearing presented 8/12 with worsening shortness of breath for 1 week.  To the hospital complaining of right-sided pleuritic chest pain and a nonproductive cough was found to have a chest x-ray showing bilateral infiltrates requiring 5 L of oxygen.  He SARS-CoV-2 PCR was positive  Clinical Impression  The patient started mobilty on 10 L HFNC. Patient ambulated x 50' with SaO2 at 95%( ear lobe monitored bestter than finger). RN notified that O2 decreased to 8 L and was 95%. Pt admitted with above diagnosis.  Pt currently with functional limitations due to the deficits listed below (see PT Problem List). Pt will benefit from skilled PT to increase their independence and safety with mobility to allow discharge to the venue listed below.   Pt. Should progress to Dc home. Continue activity and monitor saturation.    Follow Up Recommendations No PT follow up    Equipment Recommendations  None recommended by PT    Recommendations for Other Services       Precautions / Restrictions Precautions Precaution Comments: on HFNC, trying to wean down, probe placed on ear lobe, very HOH, wipe off board      Mobility  Bed Mobility Overal bed mobility: Independent                Transfers Overall transfer level: Needs assistance Equipment used: None Transfers: Sit to/from Stand Sit to Stand: Supervision            Ambulation/Gait Ambulation/Gait assistance: Supervision Gait Distance (Feet): 80 Feet Assistive device: None(pushed O2 tank) Gait Pattern/deviations: Step-through pattern Gait velocity: wfl   General Gait Details: Pt on 10 L HFNC,SaO2 95%. bumped to 8 L and placed probe on earlobe, 95%  Stairs             Wheelchair Mobility    Modified Rankin (Stroke Patients Only)       Balance Overall balance assessment: No apparent balance deficits (not formally assessed)                                           Pertinent Vitals/Pain      Home Living Family/patient expects to be discharged to:: Private residence Living Arrangements: Alone Available Help at Discharge: Available PRN/intermittently Type of Home: Apartment Home Access: Level entry     Home Layout: One level Home Equipment: None      Prior Function Level of Independence: Independent               Hand Dominance        Extremity/Trunk Assessment   Upper Extremity Assessment Upper Extremity Assessment: Overall WFL for tasks assessed    Lower Extremity Assessment Lower Extremity Assessment: Overall WFL for tasks assessed    Cervical / Trunk Assessment Cervical / Trunk Assessment: Normal  Communication   Communication: HOH(very)  Cognition Arousal/Alertness: Awake/alert Behavior During Therapy: WFL for tasks assessed/performed Overall Cognitive Status: Within Functional Limits for tasks assessed  General Comments      Exercises     Assessment/Plan    PT Assessment Patient needs continued PT services  PT Problem List Decreased mobility;Cardiopulmonary status limiting activity;Decreased activity tolerance       PT Treatment Interventions Therapeutic activities;Gait training;Functional mobility training;Therapeutic exercise;Patient/family education    PT Goals (Current goals can be found in the Care Plan section)  Acute Rehab PT Goals Patient Stated Goal: to go home PT Goal Formulation: With patient Time For Goal Achievement: 07/05/19 Potential to Achieve Goals: Good    Frequency Min 3X/week   Barriers to discharge        Co-evaluation               AM-PAC PT "6 Clicks" Mobility  Outcome Measure Help  needed turning from your back to your side while in a flat bed without using bedrails?: None Help needed moving from lying on your back to sitting on the side of a flat bed without using bedrails?: None Help needed moving to and from a bed to a chair (including a wheelchair)?: None Help needed standing up from a chair using your arms (e.g., wheelchair or bedside chair)?: None Help needed to walk in hospital room?: A Little Help needed climbing 3-5 steps with a railing? : A Little 6 Click Score: 22    End of Session Equipment Utilized During Treatment: Oxygen Activity Tolerance: Patient tolerated treatment well Patient left: in chair;with call bell/phone within reach Nurse Communication: Mobility status PT Visit Diagnosis: Difficulty in walking, not elsewhere classified (R26.2)    Time: 7445-1460 PT Time Calculation (min) (ACUTE ONLY): 39 min   Charges:   PT Evaluation $PT Eval Moderate Complexity: 1 Mod PT Treatments $Gait Training: 23-37 mins        [place]  Office [phone]   Claretha Cooper 06/21/2019, 2:03 PM

## 2019-06-22 ENCOUNTER — Inpatient Hospital Stay (HOSPITAL_COMMUNITY): Payer: Medicare HMO

## 2019-06-22 DIAGNOSIS — M7989 Other specified soft tissue disorders: Secondary | ICD-10-CM

## 2019-06-22 LAB — D-DIMER, QUANTITATIVE: D-Dimer, Quant: >20 ug/mL-FEU — ABNORMAL HIGH (ref 0.00–0.50)

## 2019-06-22 LAB — C-REACTIVE PROTEIN: CRP: 0.9 mg/dL (ref <1.0)

## 2019-06-22 NOTE — Evaluation (Signed)
Occupational Therapy Evaluation Patient Details Name: Jason [name] MRN: [id number] DOB: 1941/07/14 Today's Date: 06/22/2019    History of Present Illness Jason [name] is an 78 y.o. male past medical history of COPD hypertension recent PE diagnosed June 2020 on Xarelto, hard of hearing presented 8/12 with worsening shortness of breath for 1 week.  To the hospital complaining of right-sided pleuritic chest pain and a nonproductive cough was found to have a chest x-ray showing bilateral infiltrates requiring 5 L of oxygen.  He SARS-CoV-2 PCR was positive   Clinical Impression   PTA, pt was living alone and was independent; enjoys going on walks with his dog. Pt currently performing ADLs and functional mobility at [facility] A level demonstrating WFL balance and strength. Pt SpO2 fluctuating between 90-78% on 4L O2. Pt would benefit from further acute OT to facilitate safe dc. Recommend dc to home once medically stable per physician.       Follow Up Recommendations  No OT follow up;Supervision/Assistance - 24 hour    Equipment Recommendations  None recommended by OT    Recommendations for Other Services PT consult     Precautions / Restrictions Precautions Precautions: Other (comment) Precaution Comments: trying to wean down O2. probe placed on ear lobe, very HOH Restrictions Weight Bearing Restrictions: No      Mobility Bed Mobility Overal bed mobility: Independent                Transfers Overall transfer level: Needs assistance Equipment used: None Transfers: Sit to/from Stand Sit to Stand: Supervision         General transfer comment: Supervision for safety    Balance Overall balance assessment: No apparent balance deficits (not formally assessed)                                         ADL either performed or assessed with clinical judgement   ADL Overall ADL's : Needs assistance/impaired Eating/Feeding: Independent;Sitting    Grooming: Oral care;Set up;Supervision/safety;Standing Grooming Details (indicate cue type and reason): SpO2 flucuating between 90-78% on 4L during oral care; monitor on earlobe. Upper Body Bathing: Supervision/ safety;Set up;Sitting   Lower Body Bathing: Min guard;Sit to/from stand   Upper Body Dressing : Set up;Supervision/safety;Sitting   Lower Body Dressing: Min guard;Sit to/from stand   Toilet Transfer: Ambulation;Supervision/safety(simulated to recliner)           Functional mobility during ADLs: Supervision/safety General ADL Comments: Pt performing ADLs and functional mobility at [employer] A level. No signs of destress and denies SOB. SpO2 91-87% on 4L O2 while seated at EOB. Once performing oral care at sink, pt SpO2 flucuating between 90-78%     Vision Baseline Vision/History: Wears glasses Patient Visual Report: No change from baseline       Perception     Praxis      Pertinent Vitals/Pain Pain Assessment: No/denies pain     Hand Dominance Right   Extremity/Trunk Assessment Upper Extremity Assessment Upper Extremity Assessment: Overall WFL for tasks assessed   Lower Extremity Assessment Lower Extremity Assessment: Overall WFL for tasks assessed   Cervical / Trunk Assessment Cervical / Trunk Assessment: Normal   Communication Communication Communication: HOH(very)   Cognition Arousal/Alertness: Awake/alert Behavior During Therapy: WFL for tasks assessed/performed Overall Cognitive Status: Within Functional Limits for tasks assessed  General Comments: very HOH. Following commands. agreeable to therapy. enjoys talking about his dog, Indie.    General Comments  SpO2 dropping to 70s on RA. Requiring alteast 4L O2 to elevate to 90s.    Exercises     Shoulder Instructions      Home Living Family/patient expects to be discharged to:: Private residence Living Arrangements: Alone Available  Help at Discharge: Available PRN/intermittently Type of Home: Apartment Home Access: Level entry     Home Layout: One level     Bathroom Shower/Tub: Occupational psychologist: Standard     Home Equipment: None          Prior Functioning/Environment Level of Independence: Independent                 OT Problem List: Decreased activity tolerance;Impaired balance (sitting and/or standing);Decreased knowledge of use of DME or AE;Decreased knowledge of precautions;Cardiopulmonary status limiting activity      OT Treatment/Interventions: Self-care/ADL training;Therapeutic exercise;Energy conservation;DME and/or AE instruction;Therapeutic activities;Patient/family education    OT Goals(Current goals can be found in the care plan section) Acute Rehab OT Goals Patient Stated Goal: to go home OT Goal Formulation: With patient Time For Goal Achievement: 07/06/19 Potential to Achieve Goals: Good  OT Frequency: Min 2X/week   Barriers to D/C: Decreased caregiver support  Lives alone       Co-evaluation              AM-PAC OT "6 Clicks" Daily Activity     Outcome Measure Help from another person eating meals?: None Help from another person taking care of personal grooming?: None Help from another person toileting, which includes using toliet, bedpan, or urinal?: None Help from another person bathing (including washing, rinsing, drying)?: A Little Help from another person to put on and taking off regular upper body clothing?: None Help from another person to put on and taking off regular lower body clothing?: A Little 6 Click Score: 22   End of Session Equipment Utilized During Treatment: Oxygen(4L) Nurse Communication: Mobility status(SpO2)  Activity Tolerance: Patient tolerated treatment well Patient left: in chair;with call bell/phone within reach  OT Visit Diagnosis: Unsteadiness on feet (R26.81);Other abnormalities of gait and mobility (R26.89);Muscle  weakness (generalized) (M62.81)                Time: 8786-7672 OT Time Calculation (min): 24 min Charges:  OT General Charges $OT Visit: 1 Visit OT Evaluation $OT Eval Low Complexity: 1 Low OT Treatments $Self Care/Home Management : 8-22 mins  Jason [name] MSOT, OTR/L Acute Rehab Pager: [phone] Office: [place] 06/22/2019, 10:21 AM

## 2019-06-22 NOTE — Progress Notes (Signed)
SATURATION QUALIFICATIONS: (This note is used to comply with regulatory documentation for home oxygen)  Patient Saturations on Room Air at Rest = 76%  Patient Saturations on 4 Liters of oxygen while sitting at EOB = 87%  Patient Saturations on 4 Liters of oxygen while Ambulating = 90-78%  Please briefly explain why patient needs home oxygen: Pt requiring 4L O2 during activity to maintain SpO2 above 75%.

## 2019-06-22 NOTE — Progress Notes (Signed)
Bilateral lower extremity duplex is complete. Results will be located under DV Proc/  [name]

## 2019-06-22 NOTE — Progress Notes (Signed)
[facility] PROGRESS NOTE    Progress Note  Jason [name]  GYK:[id number] DOB: Nov 25, 1940 DOA: 06/16/2019 PCP: Lujean Amel, MD     Brief Narrative:   Jason [name] is an 78 y.o. male past medical history of COPD hypertension recent PE diagnosed June 2020 on Xarelto, hard of hearing presented with worsening shortness of breath for 1 week.  To the hospital complaining of right-sided pleuritic chest pain and a nonproductive cough was found to have a chest x-ray showing bilateral infiltrates requiring 5 L of oxygen.  He SARS-CoV-2 PCR was positive he was started empirically on IV Remdesivir and Solu-Medrol. Patient is very hard of hearing even with his hearing aids Symptoms started 8.5.2020 Medications: 06/16/2019 IV Remdesivir 06/16/2019 IV Solu-Medrol 06/19/2019 Actemra  Assessment/Plan:   Acute respiratory failure with hypoxia due to COVID-19 virus infection: He ambulated yesterday with physical therapy and he said he felt good. His oxygen saturations is improving he is only requiring 4 L of nasal cannula to keep saturations above 94%. He will complete his course of IV Remdesivir for 5 days and continue oral Decadron for 10, his inflammatory markers are improving except for his d-dimer which was 5.3 yesterday and today is >20. Lower extremity Dopplers pending., hopefully it will be negative as he is on Xarelto. He received a single dose of Actemra on the 15th. His nose is noncyanotic today.  Recent diagnosis of PE: Continue Xarelto at current home dose will need to be on lifelong Xarelto.  Positive FOBT: FOBT positive without frank blood last colonoscopy in 2015 showed polyp and diverticuli no signs of overt bleeding.    CAD (coronary artery disease): Stable  Hyperlipidemia: Continue statins.   DVT prophylaxis: xarelto Family Communication:none Disposition Plan/Barrier to D/C: once off oxygen Code Status:     Code Status Orders  (From admission, onward)        Start     Ordered   06/16/19 2108  Full code  Continuous     06/16/19 2107        Code Status History    Date Active Date Inactive Code Status Order ID Comments User Context   04/12/2019 1320 04/14/2019 1423 Full Code [id number]  Norval Morton, MD ED   Advance Care Planning Activity        IV Access:    Peripheral IV   Procedures and diagnostic studies:   No results found.   Medical Consultants:    None.  Anti-Infectives:   IV Remdesivir  Subjective:    Jason [name] relates his breathing is better, he relates he is close to baseline.  Objective:    Vitals:   06/22/19 0100 06/22/19 0200 06/22/19 0300 06/22/19 0400  BP:    126/75  Pulse: (!) 42 (!) 47 (!) 54 97  Resp: (!) 22 20 (!) 27 (!) 22  Temp:    97.6 F (36.4 C)  TempSrc:    Oral  SpO2: 93% 98% 99% (!) 84%  Weight:      Height:       SpO2: (!) 84 % O2 Flow Rate (L/min): 6 L/min   Intake/Output Summary (Last 24 hours) at 06/22/2019 0734 Last data filed at 06/22/2019 0658 Gross per 24 hour  Intake 240 ml  Output 1350 ml  Net -1110 ml   Filed Weights   06/16/19 1233 06/16/19 2120  Weight: 86.2 kg 82.5 kg    Exam: General exam: In no acute distress. Respiratory system: Good air movement and diffuse crackles. Cardiovascular system:  S1 & S2 heard, RRR. No JVD. Gastrointestinal system: Abdomen is nondistended, soft and nontender.  Central nervous system: Alert and oriented. No focal neurological deficits. Extremities: No pedal edema. Skin: No rashes, lesions or ulcers  Data Reviewed:    Labs: Basic Metabolic Panel: Recent Labs  Lab 06/16/19 1301 06/17/19 0744 06/18/19 0104 06/19/19 0300 06/20/19 0117 06/21/19 0300  NA 132* 136 137 140 141  --   K 3.5 3.4* 3.4* 4.9 4.3  --   CL 96* 102 102 108 109  --   CO2 20* 18* 22 23 24   --   GLUCOSE 137* 182* 194* 160* 127*  --   BUN 30* 23 31* 34* 33*  --   CREATININE 1.28* 0.95 0.87 0.89 0.79  --   CALCIUM 8.5* 8.4* 8.6* 8.7* 8.6*  --    MG  --  1.9 1.9 2.0 1.9 1.9  PHOS  --  3.6 2.4* 2.8 2.7 3.4   GFR Estimated Creatinine Clearance: 77.3 mL/min (by C-G formula based on SCr of 0.79 mg/dL). Liver Function Tests: Recent Labs  Lab 06/16/19 1630 06/18/19 0104 06/19/19 0300 06/20/19 0117  AST 35 35 38 121*  ALT 22 27 31  116*  ALKPHOS 47 46 45 49  BILITOT 0.8 0.4 0.4 0.3  PROT 6.7 5.7* 6.3* 6.0*  ALBUMIN 2.3* 2.1* 2.2* 2.3*   No results for input(s): LIPASE, AMYLASE in the last 168 hours. No results for input(s): AMMONIA in the last 168 hours. Coagulation profile No results for input(s): INR, PROTIME in the last 168 hours. COVID-19 Labs  Recent Labs    06/20/19 0117 06/21/19 0850 06/22/19 0355  DDIMER 5.38* >20.00*  --   FERRITIN 838*  --   --   CRP 3.8* 1.7* 0.9    Lab Results  Component Value Date   SARSCOV2NAA POSITIVE (A) 06/16/2019   SARSCOV2NAA NOT DETECTED 04/12/2019    CBC: Recent Labs  Lab 06/16/19 1301 06/17/19 0744 06/18/19 0104  WBC 5.8 2.8* 8.7  NEUTROABS 5.0  --   --   HGB 13.0 13.0 11.2*  HCT 38.8* 38.7* 33.9*  MCV 100.0 98.5 99.1  PLT 385 378 412*   Cardiac Enzymes: Recent Labs  Lab 06/17/19 0744 06/18/19 0104 06/19/19 0300 06/20/19 0117 06/21/19 0300  CKTOTAL 168 78 74 30* 20*   BNP (last 3 results) No results for input(s): PROBNP in the last 8760 hours. CBG: No results for input(s): GLUCAP in the last 168 hours. D-Dimer: Recent Labs    06/20/19 0117 06/21/19 0850  DDIMER 5.38* >20.00*   Hgb A1c: No results for input(s): HGBA1C in the last 72 hours. Lipid Profile: Recent Labs    06/20/19 0117 06/21/19 0300  TRIG 45 48   Thyroid function studies: No results for input(s): TSH, T4TOTAL, T3FREE, THYROIDAB in the last 72 hours.  Invalid input(s): FREET3 Anemia work up: Recent Labs    06/20/19 0117  FERRITIN 838*   Sepsis Labs: Recent Labs  Lab 06/16/19 1301 06/16/19 1515 06/16/19 1630 06/17/19 0744 06/18/19 0104  PROCALCITON  --   --  <0.10  <0.10 <0.10  WBC 5.8  --   --  2.8* 8.7  LATICACIDVEN  --  1.7  --   --   --    Microbiology Recent Results (from the past 240 hour(s))  SARS Coronavirus 2 [facility] order, Performed in [facility] hospital lab) Nasopharyngeal Nasopharyngeal Swab     Status: Abnormal   Collection Time: 06/16/19  1:20 PM   Specimen: Nasopharyngeal  Swab  Result Value Ref Range Status   SARS Coronavirus 2 POSITIVE (A) NEGATIVE Final    Comment: RESULT CALLED TO, READ BACK BY AND VERIFIED WITH: P. Pulliam RN 15:35 06/16/19 ([email or web address]) (NOTE) If result is NEGATIVE SARS-CoV-2 target nucleic acids are NOT DETECTED. The SARS-CoV-2 RNA is generally detectable in upper and lower  respiratory specimens during the acute phase of infection. The lowest  concentration of SARS-CoV-2 viral copies this assay can detect is 250  copies / mL. A negative result does not preclude SARS-CoV-2 infection  and should not be used as the sole basis for treatment or other  patient management decisions.  A negative result may occur with  improper specimen collection / handling, submission of specimen other  than nasopharyngeal swab, presence of viral mutation(s) within the  areas targeted by this assay, and inadequate number of viral copies  (<250 copies / mL). A negative result must be combined with clinical  observations, patient history, and epidemiological information. If result is POSITIVE SARS-CoV-2 target nucleic acids are DETECTED.  The SARS-CoV-2 RNA is generally detectable in upper and lower  respiratory specimens during the acute phase of infection.  Positive  results are indicative of active infection with SARS-CoV-2.  Clinical  correlation with patient history and other diagnostic information is  necessary to determine patient infection status.  Positive results do  not rule out bacterial infection or co-infection with other viruses. If result is PRESUMPTIVE POSTIVE SARS-CoV-2 nucleic acids MAY BE PRESENT.   A  presumptive positive result was obtained on the submitted specimen  and confirmed on repeat testing.  While 2019 novel coronavirus  (SARS-CoV-2) nucleic acids may be present in the submitted sample  additional confirmatory testing may be necessary for epidemiological  and / or clinical management purposes  to differentiate between  SARS-CoV-2 and other Sarbecovirus currently known to infect humans.  If clinically indicated additional testing with an alternate test  methodology [phone])  is advised. The SARS-CoV-2 RNA is generally  detectable in upper and lower respiratory specimens during the acute  phase of infection. The expected result is Negative. Fact Sheet for Patients:  StrictlyIdeas.no Fact Sheet for Healthcare Providers: BankingDealers.co.za This test is not yet approved or cleared by the [place] FDA and has been authorized for detection and/or diagnosis of SARS-CoV-2 by FDA under an Emergency Use Authorization (EUA).  This EUA will remain in effect (meaning this test can be used) for the duration of the COVID-19 declaration under Section 564(b)(1) of the Act, 21 U.S.C. section 360bbb-3(b)(1), unless the authorization is terminated or revoked sooner. Performed at [facility], [place] [address], [place], [place] [address]   Culture, blood (routine x 2)     Status: None   Collection Time: 06/16/19  3:00 PM   Specimen: BLOOD RIGHT WRIST  Result Value Ref Range Status   Specimen Description BLOOD RIGHT WRIST  Final   Special Requests   Final    BOTTLES DRAWN AEROBIC AND ANAEROBIC Blood Culture adequate volume   Culture   Final    NO GROWTH 5 DAYS Performed at [facility], [place] [address] [place], [place] [address]    Report Status 06/21/2019 FINAL  Final  Culture, blood (routine x 2)     Status: None   Collection Time: 06/16/19  3:05 PM   Specimen: BLOOD  Result Value Ref Range Status   Specimen Description  BLOOD LEFT ANTECUBITAL  Final   Special Requests   Final  BOTTLES DRAWN AEROBIC AND ANAEROBIC Blood Culture adequate volume   Culture   Final    NO GROWTH 5 DAYS Performed at [facility], [place] [address] [place], [place] [address]    Report Status 06/21/2019 FINAL  Final  Respiratory Panel by PCR     Status: None   Collection Time: 06/16/19  9:55 PM   Specimen: Nasopharyngeal Swab; Respiratory  Result Value Ref Range Status   Adenovirus NOT DETECTED NOT DETECTED Final   Coronavirus 229E NOT DETECTED NOT DETECTED Final    Comment: (NOTE) The Coronavirus on the Respiratory Panel, DOES NOT test for the novel  Coronavirus (2019 nCoV)    Coronavirus HKU1 NOT DETECTED NOT DETECTED Final   Coronavirus NL63 NOT DETECTED NOT DETECTED Final   Coronavirus OC43 NOT DETECTED NOT DETECTED Final   Metapneumovirus NOT DETECTED NOT DETECTED Final   Rhinovirus / Enterovirus NOT DETECTED NOT DETECTED Final   Influenza A NOT DETECTED NOT DETECTED Final   Influenza B NOT DETECTED NOT DETECTED Final   Parainfluenza Virus 1 NOT DETECTED NOT DETECTED Final   Parainfluenza Virus 2 NOT DETECTED NOT DETECTED Final   Parainfluenza Virus 3 NOT DETECTED NOT DETECTED Final   Parainfluenza Virus 4 NOT DETECTED NOT DETECTED Final   Respiratory Syncytial Virus NOT DETECTED NOT DETECTED Final   Bordetella pertussis NOT DETECTED NOT DETECTED Final   Chlamydophila pneumoniae NOT DETECTED NOT DETECTED Final   Mycoplasma pneumoniae NOT DETECTED NOT DETECTED Final    Comment: Performed at [facility], [place] [address], [place], [place] [address]  C difficile quick scan w PCR reflex     Status: None   Collection Time: 06/16/19 10:05 PM   Specimen: STOOL  Result Value Ref Range Status   C Diff antigen NEGATIVE NEGATIVE Final   C Diff toxin NEGATIVE NEGATIVE Final   C Diff interpretation No C. difficile detected.  Final    Comment: Performed at [facility], [place] [address] [place], [place]  [address]     Medications:   . albuterol  2 puff Inhalation Q6H  . cholecalciferol  2,000 Units Oral Daily  . dexamethasone  6 mg Oral Daily  . multivitamin with minerals  1 tablet Oral Daily  . pravastatin  80 mg Oral QHS  . rivaroxaban  20 mg Oral QPC supper  . traZODone  100 mg Oral QHS   Continuous Infusions:    LOS: 6 days   Charlynne Cousins  [facility]  06/22/2019, 7:34 AM

## 2019-06-23 LAB — D-DIMER, QUANTITATIVE: D-Dimer, Quant: >20 ug/mL-FEU — ABNORMAL HIGH (ref 0.00–0.50)

## 2019-06-23 LAB — C-REACTIVE PROTEIN: CRP: <0.8 mg/dL (ref <1.0)

## 2019-06-23 NOTE — Plan of Care (Signed)

## 2019-06-23 NOTE — Plan of Care (Signed)
Spoke with son in regards to fathers care. Son states that he feels that father should go to rehab after discharge due to inability to complete ADL's w/o assistance. Also stated that entire family has covid and he is unable to assist him with his ADL's. No other concerns at this time.

## 2019-06-23 NOTE — Progress Notes (Signed)
Educated pt on use of incentive. Encourage pt use. Goal 1500, he was able to achieve.

## 2019-06-23 NOTE — Progress Notes (Signed)
PROGRESS NOTE  Jason [name]  ZOX:[id number] DOB: 01-Aug-1941 DOA: 06/16/2019 PCP: Lujean Amel, MD  Brief Narrative: Jason [name] is a 78 y.o. male with a history of COPD, HTN, recently diagnosed recurrent PE now on xarelto, HLD, and HOH who presented to the ED with 1 week (starting 8/5) of progressive shortness of breath, nonproductive cough, and right-sided pleuritic chest pain. He was hypoxic with positive covid-19 assay, elevated inflammatory markers and bilateral CXR infiltrates. Remdesivir and steroids started 8/12, tocilizumab given 8/15.   Assessment & Plan: Principal Problem:   COVID-19 virus infection Active Problems:   Tobacco abuse   Pulmonary embolism (HCC)   Acute respiratory failure with hypoxia (HCC)   Bilateral pneumonia   CAD (coronary artery disease)   Hyperlipidemia   Acute hypoxic respiratory failure due to covid-19 pneumonia:  - Completed remdesivir x5 days - Continue steroids x10 days - Given tocilizumab 8/15.  - Trend inflammatory markers  Elevated d-dimer:  - LE venous U/S without DVT and respiratory symptoms are improving, not consistent with acute VTE, which is unlikely on full dose anticoagulation. Elevated d-dimer is to be expected with covid-19 infection. We will continue trending this. I discussed CTA chest with the patient today who opts to forego the study for now given his improving symptoms.   History of recurrent PE:  - Lifelong anticoagulation with xarelto, reports full compliance.   COPD: No wheezing - Continue steroids as above, prn albuterol MDI  Positive FOBT: Without gross bleeding. Hgb stable, will recheck in AM. Had colonoscopy 2015 with polyp and diverticuli.  - Monitor CBC, clinically.  Hyperlipidemia:  - Continue statin  HTN:  - Hold home medications as he's normotensive  Anxiety, depression:  - Continue librium prn, trazodone qHS  DVT prophylaxis: Xarelto Code Status: DNR Family Communication: Patient did not request call to  family.  Disposition Plan: Uncertain, pending improvement in hypoxia  Consultants:   None  Procedures:   None  Antimicrobials:  Remdesivir   Subjective: Feels continued improvement in dyspnea, none at rest, less but still moderate, constant with exertion walking the hallways with PT/OT. No chest pain, no hemoptysis, no leg swelling or orthopnea.   Objective: Vitals:   06/22/19 0805 06/22/19 1600 06/22/19 2054 06/23/19 0700  BP:   113/67 109/62  Pulse:   60   Resp:   18 18  Temp: 98 F (36.7 C) 98.7 F (37.1 C) 98.6 F (37 C) 98.2 F (36.8 C)  TempSrc:   Oral Oral  SpO2:   100%   Weight:      Height:        Intake/Output Summary (Last 24 hours) at 06/23/2019 1358 Last data filed at 06/23/2019 1300 Gross per 24 hour  Intake -  Output 1820 ml  Net -1820 ml   Filed Weights   06/16/19 1233 06/16/19 2120  Weight: 86.2 kg 82.5 kg    Gen: 78 y.o. male in no distress Pulm: Non-labored breathing supplemental oxygen. Crackles diffusely CV: Regular rate and rhythm. No murmur, rub, or gallop. No JVD, no pedal edema. GI: Abdomen soft, non-tender, non-distended, with normoactive bowel sounds. No organomegaly or masses felt. Ext: Warm, no deformities Skin: No rashes, lesions no ulcers Neuro: Alert and oriented. No focal neurological deficits. Psych: Judgement and insight appear normal. Mood & affect appropriate.   Data Reviewed: I have personally reviewed following labs and imaging studies  CBC: Recent Labs  Lab 06/17/19 0744 06/18/19 0104  WBC 2.8* 8.7  HGB 13.0 11.2*  HCT 38.7*  33.9*  MCV 98.5 99.1  PLT 378 195*   Basic Metabolic Panel: Recent Labs  Lab 06/17/19 0744 06/18/19 0104 06/19/19 0300 06/20/19 0117 06/21/19 0300  NA 136 137 140 141  --   K 3.4* 3.4* 4.9 4.3  --   CL 102 102 108 109  --   CO2 18* 22 23 24   --   GLUCOSE 182* 194* 160* 127*  --   BUN 23 31* 34* 33*  --   CREATININE 0.95 0.87 0.89 0.79  --   CALCIUM 8.4* 8.6* 8.7* 8.6*  --    MG 1.9 1.9 2.0 1.9 1.9  PHOS 3.6 2.4* 2.8 2.7 3.4   GFR: Estimated Creatinine Clearance: 77.3 mL/min (by C-G formula based on SCr of 0.79 mg/dL). Liver Function Tests: Recent Labs  Lab 06/16/19 1630 06/18/19 0104 06/19/19 0300 06/20/19 0117  AST 35 35 38 121*  ALT 22 27 31  116*  ALKPHOS 47 46 45 49  BILITOT 0.8 0.4 0.4 0.3  PROT 6.7 5.7* 6.3* 6.0*  ALBUMIN 2.3* 2.1* 2.2* 2.3*   No results for input(s): LIPASE, AMYLASE in the last 168 hours. No results for input(s): AMMONIA in the last 168 hours. Coagulation Profile: No results for input(s): INR, PROTIME in the last 168 hours. Cardiac Enzymes: Recent Labs  Lab 06/17/19 0744 06/18/19 0104 06/19/19 0300 06/20/19 0117 06/21/19 0300  CKTOTAL 168 78 74 30* 20*   BNP (last 3 results) No results for input(s): PROBNP in the last 8760 hours. HbA1C: No results for input(s): HGBA1C in the last 72 hours. CBG: No results for input(s): GLUCAP in the last 168 hours. Lipid Profile: Recent Labs    06/21/19 0300  TRIG 48   Thyroid Function Tests: No results for input(s): TSH, T4TOTAL, FREET4, T3FREE, THYROIDAB in the last 72 hours. Anemia Panel: No results for input(s): VITAMINB12, FOLATE, FERRITIN, TIBC, IRON, RETICCTPCT in the last 72 hours. Urine analysis:    Component Value Date/Time   COLORURINE YELLOW 05/31/2019 2218   APPEARANCEUR CLEAR 05/31/2019 2218   LABSPEC >1.030 (H) 05/31/2019 2218   PHURINE 5.5 05/31/2019 2218   GLUCOSEU NEGATIVE 05/31/2019 2218   HGBUR SMALL (A) 05/31/2019 2218   BILIRUBINUR MODERATE (A) 05/31/2019 2218   KETONESUR 40 (A) 05/31/2019 2218   PROTEINUR 100 (A) 05/31/2019 2218   NITRITE NEGATIVE 05/31/2019 2218   LEUKOCYTESUR NEGATIVE 05/31/2019 2218   Recent Results (from the past 240 hour(s))  SARS Coronavirus 2 [facility] order, Performed in [facility] hospital lab) Nasopharyngeal Nasopharyngeal Swab     Status: Abnormal   Collection Time: 06/16/19  1:20 PM   Specimen: Nasopharyngeal  Swab  Result Value Ref Range Status   SARS Coronavirus 2 POSITIVE (A) NEGATIVE Final    Comment: RESULT CALLED TO, READ BACK BY AND VERIFIED WITH: Claretta Fraise RN 15:35 06/16/19 ([email or web address]) (NOTE) If result is NEGATIVE SARS-CoV-2 target nucleic acids are NOT DETECTED. The SARS-CoV-2 RNA is generally detectable in upper and lower  respiratory specimens during the acute phase of infection. The lowest  concentration of SARS-CoV-2 viral copies this assay can detect is 250  copies / mL. A negative result does not preclude SARS-CoV-2 infection  and should not be used as the sole basis for treatment or other  patient management decisions.  A negative result may occur with  improper specimen collection / handling, submission of specimen other  than nasopharyngeal swab, presence of viral mutation(s) within the  areas targeted by this assay, and inadequate number of viral copies  (<  250 copies / mL). A negative result must be combined with clinical  observations, patient history, and epidemiological information. If result is POSITIVE SARS-CoV-2 target nucleic acids are DETECTED.  The SARS-CoV-2 RNA is generally detectable in upper and lower  respiratory specimens during the acute phase of infection.  Positive  results are indicative of active infection with SARS-CoV-2.  Clinical  correlation with patient history and other diagnostic information is  necessary to determine patient infection status.  Positive results do  not rule out bacterial infection or co-infection with other viruses. If result is PRESUMPTIVE POSTIVE SARS-CoV-2 nucleic acids MAY BE PRESENT.   A presumptive positive result was obtained on the submitted specimen  and confirmed on repeat testing.  While 2019 novel coronavirus  (SARS-CoV-2) nucleic acids may be present in the submitted sample  additional confirmatory testing may be necessary for epidemiological  and / or clinical management purposes  to differentiate between   SARS-CoV-2 and other Sarbecovirus currently known to infect humans.  If clinically indicated additional testing with an alternate test  methodology [phone])  is advised. The SARS-CoV-2 RNA is generally  detectable in upper and lower respiratory specimens during the acute  phase of infection. The expected result is Negative. Fact Sheet for Patients:  StrictlyIdeas.no Fact Sheet for Healthcare Providers: BankingDealers.co.za This test is not yet approved or cleared by the [place] FDA and has been authorized for detection and/or diagnosis of SARS-CoV-2 by FDA under an Emergency Use Authorization (EUA).  This EUA will remain in effect (meaning this test can be used) for the duration of the COVID-19 declaration under Section 564(b)(1) of the Act, 21 U.S.C. section 360bbb-3(b)(1), unless the authorization is terminated or revoked sooner. Performed at [facility], [place] [address], [place], [place] [address]   Culture, blood (routine x 2)     Status: None   Collection Time: 06/16/19  3:00 PM   Specimen: BLOOD RIGHT WRIST  Result Value Ref Range Status   Specimen Description BLOOD RIGHT WRIST  Final   Special Requests   Final    BOTTLES DRAWN AEROBIC AND ANAEROBIC Blood Culture adequate volume   Culture   Final    NO GROWTH 5 DAYS Performed at [facility], [place] [address], [place], [place] [address]    Report Status 06/21/2019 FINAL  Final  Culture, blood (routine x 2)     Status: None   Collection Time: 06/16/19  3:05 PM   Specimen: BLOOD  Result Value Ref Range Status   Specimen Description BLOOD LEFT ANTECUBITAL  Final   Special Requests   Final    BOTTLES DRAWN AEROBIC AND ANAEROBIC Blood Culture adequate volume   Culture   Final    NO GROWTH 5 DAYS Performed at [facility], [place] [address], [place], [place] [address]    Report Status 06/21/2019 FINAL  Final  Respiratory Panel by PCR     Status: None    Collection Time: 06/16/19  9:55 PM   Specimen: Nasopharyngeal Swab; Respiratory  Result Value Ref Range Status   Adenovirus NOT DETECTED NOT DETECTED Final   Coronavirus 229E NOT DETECTED NOT DETECTED Final    Comment: (NOTE) The Coronavirus on the Respiratory Panel, DOES NOT test for the novel  Coronavirus (2019 nCoV)    Coronavirus HKU1 NOT DETECTED NOT DETECTED Final   Coronavirus NL63 NOT DETECTED NOT DETECTED Final   Coronavirus OC43 NOT DETECTED NOT DETECTED Final   Metapneumovirus NOT DETECTED NOT DETECTED Final  Rhinovirus / Enterovirus NOT DETECTED NOT DETECTED Final   Influenza A NOT DETECTED NOT DETECTED Final   Influenza B NOT DETECTED NOT DETECTED Final   Parainfluenza Virus 1 NOT DETECTED NOT DETECTED Final   Parainfluenza Virus 2 NOT DETECTED NOT DETECTED Final   Parainfluenza Virus 3 NOT DETECTED NOT DETECTED Final   Parainfluenza Virus 4 NOT DETECTED NOT DETECTED Final   Respiratory Syncytial Virus NOT DETECTED NOT DETECTED Final   Bordetella pertussis NOT DETECTED NOT DETECTED Final   Chlamydophila pneumoniae NOT DETECTED NOT DETECTED Final   Mycoplasma pneumoniae NOT DETECTED NOT DETECTED Final    Comment: Performed at [facility], [place] [address], [place], [place] [address]  C difficile quick scan w PCR reflex     Status: None   Collection Time: 06/16/19 10:05 PM   Specimen: STOOL  Result Value Ref Range Status   C Diff antigen NEGATIVE NEGATIVE Final   C Diff toxin NEGATIVE NEGATIVE Final   C Diff interpretation No C. difficile detected.  Final    Comment: Performed at [facility], [place] [address], [place], [place] [address]      Radiology Studies: Vas [place] Lower Extremity Venous (dvt) Result Date: 06/22/2019 Summary: Right: No evidence of deep vein thrombosis in the lower extremity. No indirect evidence of obstruction proximal to the inguinal ligament. There is no evidence of deep vein thrombosis in the lower extremity. No cystic structure found  in the popliteal fossa. Left: No evidence of deep vein thrombosis in the lower extremity. No indirect evidence of obstruction proximal to the inguinal ligament. There is no evidence of deep vein thrombosis in the lower extremity. No cystic structure found in the popliteal fossa.  *See table(s) above for measurements and observations. Electronically signed by Deitra Mayo MD on 06/22/2019 at 3:34:05 PM.    Final     Scheduled Meds: . albuterol  2 puff Inhalation Q6H  . cholecalciferol  2,000 Units Oral Daily  . dexamethasone  6 mg Oral Daily  . multivitamin with minerals  1 tablet Oral Daily  . pravastatin  80 mg Oral QHS  . rivaroxaban  20 mg Oral QPC supper  . traZODone  100 mg Oral QHS   Continuous Infusions:   LOS: 7 days   Time spent: 35 minutes.  Patrecia Pour, MD [facility] [email or web address] Password [email or web address] 06/23/2019, 1:58 PM

## 2019-06-23 NOTE — Plan of Care (Signed)
  Problem: Education: Goal: Knowledge of General Education information will improve Description: Including pain rating scale, medication(s)/side effects and non-pharmacologic comfort measures 06/23/2019 0344 by Blinda Leatherwood, RN Outcome: Progressing 06/23/2019 0301 by Blinda Leatherwood, RN Outcome: Progressing   Problem: Health Behavior/Discharge Planning: Goal: Ability to manage health-related needs will improve 06/23/2019 0344 by Blinda Leatherwood, RN Outcome: Progressing 06/23/2019 0301 by Lysbeth Penner D, RN Outcome: Progressing   Problem: Clinical Measurements: Goal: Ability to maintain clinical measurements within normal limits will improve 06/23/2019 0344 by Blinda Leatherwood, RN Outcome: Progressing 06/23/2019 0301 by Lysbeth Penner D, RN Outcome: Progressing Goal: Will remain free from infection 06/23/2019 0344 by Lysbeth Penner D, RN Outcome: Progressing 06/23/2019 0301 by Lysbeth Penner D, RN Outcome: Progressing Goal: Diagnostic test results will improve 06/23/2019 0344 by Lysbeth Penner D, RN Outcome: Progressing 06/23/2019 0301 by Lysbeth Penner D, RN Outcome: Progressing Goal: Respiratory complications will improve 06/23/2019 0344 by Lysbeth Penner D, RN Outcome: Progressing 06/23/2019 0301 by Lysbeth Penner D, RN Outcome: Progressing Goal: Cardiovascular complication will be avoided 06/23/2019 0344 by Lysbeth Penner D, RN Outcome: Progressing 06/23/2019 0301 by Blinda Leatherwood, RN Outcome: Progressing   Problem: Activity: Goal: Risk for activity intolerance will decrease 06/23/2019 0344 by Lysbeth Penner D, RN Outcome: Progressing 06/23/2019 0301 by Lysbeth Penner D, RN Outcome: Progressing   Problem: Nutrition: Goal: Adequate nutrition will be maintained 06/23/2019 0344 by Lysbeth Penner D, RN Outcome: Progressing 06/23/2019 0301 by Lysbeth Penner D, RN Outcome: Progressing   Problem: Coping: Goal: Level of anxiety will decrease 06/23/2019 0344 by Lysbeth Penner D, RN Outcome:  Progressing 06/23/2019 0301 by Lysbeth Penner D, RN Outcome: Progressing   Problem: Elimination: Goal: Will not experience complications related to bowel motility 06/23/2019 0344 by Blinda Leatherwood, RN Outcome: Progressing 06/23/2019 0301 by Lysbeth Penner D, RN Outcome: Progressing Goal: Will not experience complications related to urinary retention 06/23/2019 0344 by Blinda Leatherwood, RN Outcome: Progressing 06/23/2019 0301 by Lysbeth Penner D, RN Outcome: Progressing   Problem: Pain Managment: Goal: General experience of comfort will improve 06/23/2019 0344 by Blinda Leatherwood, RN Outcome: Progressing 06/23/2019 0301 by Lysbeth Penner D, RN Outcome: Progressing   Problem: Skin Integrity: Goal: Risk for impaired skin integrity will decrease 06/23/2019 0344 by Lysbeth Penner D, RN Outcome: Progressing 06/23/2019 0301 by Lysbeth Penner D, RN Outcome: Progressing   Problem: Safety: Goal: Ability to remain free from injury will improve 06/23/2019 0344 by Blinda Leatherwood, RN Outcome: Progressing 06/23/2019 0301 by Lysbeth Penner D, RN Outcome: Progressing   Problem: Pain Managment: Goal: General experience of comfort will improve 06/23/2019 0344 by Blinda Leatherwood, RN Outcome: Progressing 06/23/2019 0301 by Blinda Leatherwood, RN Outcome: Progressing

## 2019-06-23 NOTE — Progress Notes (Signed)
Occupational Therapy Treatment Patient Details Name: Jason [name] MRN: [id number] DOB: 1941-09-30 Today's Date: 06/23/2019    History of present illness Jason [name] is an 78 y.o. male past medical history of COPD hypertension recent PE diagnosed June 2020 on Xarelto, hard of hearing presented 8/12 with worsening shortness of breath for 1 week.  To the hospital complaining of right-sided pleuritic chest pain and a nonproductive cough was found to have a chest x-ray showing bilateral infiltrates requiring 5 L of oxygen.  He SARS-CoV-2 PCR was positive   OT comments  Pt progressing towards OT goals this session. Able to complete bed mobility at independent level, standing grooming at minguard/supervision level and lap around unit with RW at min guard/supervision level. With O2 probe placed on forehead, Pt was initially 2.5L O2 saturating at >92%. Pt required increased O2 support from 2.5L to 4L with activity and continued to desaturate to mid 70's with standing rest break with pursed lip breathing able to come back up to >90%. Pt's son's family are unable to provide 24 hour assistance because they all have covid according to RN. Recommend dc to SNF due to decreased activity tolerance, and cardiovascular status from PLOF.   Follow Up Recommendations  SNF;Supervision/Assistance - 24 hour    Equipment Recommendations  None recommended by OT    Recommendations for Other Services PT consult    Precautions / Restrictions Precautions Precaution Comments: O2 probe placed on forehead Restrictions Weight Bearing Restrictions: No       Mobility Bed Mobility Overal bed mobility: Independent                Transfers Overall transfer level: Needs assistance Equipment used: Rolling walker (2 wheeled) Transfers: Sit to/from Stand Sit to Stand: Supervision         General transfer comment: Supervision for safety    Balance Overall balance assessment: No apparent balance deficits (not  formally assessed)                                         ADL either performed or assessed with clinical judgement   ADL Overall ADL's : Needs assistance/impaired     Grooming: Wash/dry hands;Supervision/safety;Standing Grooming Details (indicate cue type and reason): sink level                 Toilet Transfer: Supervision/safety;Ambulation;RW           Functional mobility during ADLs: Supervision/safety;Rolling walker General ADL Comments: Pt required increased O2 support from 2.5L to 4L and continued to desaturate to mid 70's with standing rest break with pursed lip breathing able to come back up to >90%.      Vision       Perception     Praxis      Cognition Arousal/Alertness: Awake/alert Behavior During Therapy: WFL for tasks assessed/performed Overall Cognitive Status: Within Functional Limits for tasks assessed                                 General Comments: very HOH. Following commands. agreeable to therapy. enjoys talking about his dog, Indie.         Exercises     Shoulder Instructions       General Comments      Pertinent Vitals/ Pain       Pain Assessment: No/denies pain  Home Living                                          Prior Functioning/Environment              Frequency  Min 2X/week        Progress Toward Goals  OT Goals(current goals can now be found in the care plan section)  Progress towards OT goals: Progressing toward goals  Acute Rehab OT Goals Patient Stated Goal: get home to dog Indie OT Goal Formulation: With patient Time For Goal Achievement: 07/06/19 Potential to Achieve Goals: Good  Plan Discharge plan needs to be updated    Co-evaluation                 AM-PAC OT "6 Clicks" Daily Activity     Outcome Measure   Help from another person eating meals?: None Help from another person taking care of personal grooming?: None Help from another  person toileting, which includes using toliet, bedpan, or urinal?: None Help from another person bathing (including washing, rinsing, drying)?: A Little Help from another person to put on and taking off regular upper body clothing?: None Help from another person to put on and taking off regular lower body clothing?: A Little 6 Click Score: 22    End of Session Equipment Utilized During Treatment: Oxygen(2.5-4L)  OT Visit Diagnosis: Unsteadiness on feet (R26.81);Other abnormalities of gait and mobility (R26.89);Muscle weakness (generalized) (M62.81)   Activity Tolerance Patient tolerated treatment well   Patient Left in chair;with call bell/phone within reach   Nurse Communication Mobility status        Time: 9373-4287 OT Time Calculation (min): 33 min  Charges: OT General Charges $OT Visit: 1 Visit OT Treatments $Self Care/Home Management : 8-22 mins $Therapeutic Activity: 8-22 mins  Hulda Humphrey OTR/L Acute Rehabilitation Services Pager: [phone] Office: [place] 06/23/2019, 2:53 PM

## 2019-06-23 NOTE — Plan of Care (Signed)

## 2019-06-24 ENCOUNTER — Inpatient Hospital Stay (HOSPITAL_COMMUNITY): Payer: Medicare HMO

## 2019-06-24 LAB — CBC WITH DIFFERENTIAL/PLATELET
Abs Immature Granulocytes: 0.4 10*3/uL — ABNORMAL HIGH (ref 0.00–0.07)
Basophils Absolute: 0.1 10*3/uL (ref 0.0–0.1)
Basophils Relative: 1 %
Eosinophils Absolute: 0 10*3/uL (ref 0.0–0.5)
Eosinophils Relative: 0 %
HCT: 41 % (ref 39.0–52.0)
Hemoglobin: 13.8 g/dL (ref 13.0–17.0)
Immature Granulocytes: 4 %
Lymphocytes Relative: 8 %
Lymphs Abs: 0.7 10*3/uL (ref 0.7–4.0)
MCH: 33.9 pg (ref 26.0–34.0)
MCHC: 33.7 g/dL (ref 30.0–36.0)
MCV: 100.7 fL — ABNORMAL HIGH (ref 80.0–100.0)
Monocytes Absolute: 1 10*3/uL (ref 0.1–1.0)
Monocytes Relative: 11 %
Neutro Abs: 6.8 10*3/uL (ref 1.7–7.7)
Neutrophils Relative %: 76 %
Platelets: 309 10*3/uL (ref 150–400)
RBC: 4.07 MIL/uL — ABNORMAL LOW (ref 4.22–5.81)
RDW: 13.5 % (ref 11.5–15.5)
WBC: 9.1 10*3/uL (ref 4.0–10.5)
nRBC: 0.4 % — ABNORMAL HIGH (ref 0.0–0.2)

## 2019-06-24 LAB — COMPREHENSIVE METABOLIC PANEL
ALT: 81 U/L — ABNORMAL HIGH (ref 0–44)
AST: 36 U/L (ref 15–41)
Albumin: 2.6 g/dL — ABNORMAL LOW (ref 3.5–5.0)
Alkaline Phosphatase: 48 U/L (ref 38–126)
Anion gap: 9 (ref 5–15)
BUN: 24 mg/dL — ABNORMAL HIGH (ref 8–23)
CO2: 25 mmol/L (ref 22–32)
Calcium: 8.5 mg/dL — ABNORMAL LOW (ref 8.9–10.3)
Chloride: 104 mmol/L (ref 98–111)
Creatinine, Ser: 0.87 mg/dL (ref 0.61–1.24)
GFR calc Af Amer: >60 mL/min (ref >60)
GFR calc non Af Amer: >60 mL/min (ref >60)
Glucose, Bld: 102 mg/dL — ABNORMAL HIGH (ref 70–99)
Potassium: 4.3 mmol/L (ref 3.5–5.1)
Sodium: 138 mmol/L (ref 135–145)
Total Bilirubin: 0.6 mg/dL (ref 0.3–1.2)
Total Protein: 6.1 g/dL — ABNORMAL LOW (ref 6.5–8.1)

## 2019-06-24 LAB — C-REACTIVE PROTEIN: CRP: <0.8 mg/dL (ref <1.0)

## 2019-06-24 LAB — INTERLEUKIN-6, PLASMA: Interleukin-6, Plasma: 300.9 pg/mL (ref 0.0–12.2)

## 2019-06-24 LAB — D-DIMER, QUANTITATIVE: D-Dimer, Quant: >20 ug/mL-FEU — ABNORMAL HIGH (ref 0.00–0.50)

## 2019-06-24 MED ORDER — IOHEXOL 350 MG/ML SOLN
100.0000 mL | Freq: Once | INTRAVENOUS | Status: AC | PRN
  Administered 2019-06-24: 100 mL via INTRAVENOUS

## 2019-06-24 NOTE — Progress Notes (Signed)
Physical Therapy Treatment Patient Details Name: Jason [name] MRN: [id number] DOB: February 26, 1941 Today's Date: 06/24/2019    History of Present Illness Jason [name] is an 78 y.o. male past medical history of COPD hypertension recent PE diagnosed June 2020 on Xarelto, hard of hearing presented 8/12 with worsening shortness of breath for 1 week.  To the hospital complaining of right-sided pleuritic chest pain and a nonproductive cough was found to have a chest x-ray showing bilateral infiltrates requiring 5 L of oxygen.  He SARS-CoV-2 PCR was positive    PT Comments    The patient was monitored on the forehead during ambulation. Started on 2 L, increased to 4 L [place] to keep SPO2 up., still dropping into low80's with activity. Dyspnea 3/4. Continue PT , monitor O2 needs. Patient may benefit from Short term rehab.Continues to require O2.   Follow Up Recommendations  SNF vs HHPT     Equipment Recommendations  None recommended by PT    Recommendations for Other Services       Precautions / Restrictions Precautions Precaution Comments: O2 probe placed on forehead, use d NELCOR also w/ PT    Mobility  Bed Mobility Overal bed mobility: Independent                Transfers   Equipment used: None Transfers: Sit to/from Stand Sit to Stand: Supervision            Ambulation/Gait Ambulation/Gait assistance: Supervision Gait Distance (Feet): 200 Feet Assistive device: Rolling walker (2 wheeled) Gait Pattern/deviations: Step-through pattern     General Gait Details: pt on 1.5 L, increased to 4 with SpO2 w/Nelcor 81%. Gradual recovery in 3 minutes to > 90%, replaced to 1.5 L resting at 94% with finger probe on forehead.   Stairs             Wheelchair Mobility    Modified Rankin (Stroke Patients Only)       Balance                                            Cognition Arousal/Alertness: Awake/alert                                             Exercises      General Comments        Pertinent Vitals/Pain Pain Assessment: No/denies pain    Home Living                      Prior Function            PT Goals (current goals can now be found in the care plan section) Progress towards PT goals: Progressing toward goals    Frequency    Min 3X/week      PT Plan Discharge plan needs to be updated    Co-evaluation              AM-PAC PT "6 Clicks" Mobility   Outcome Measure  Help needed turning from your back to your side while in a flat bed without using bedrails?: None Help needed moving from lying on your back to sitting on the side of a flat bed without using bedrails?: None Help needed moving to and from a bed  to a chair (including a wheelchair)?: None Help needed standing up from a chair using your arms (e.g., wheelchair or bedside chair)?: None Help needed to walk in hospital room?: A Little Help needed climbing 3-5 steps with a railing? : A Little 6 Click Score: 22    End of Session Equipment Utilized During Treatment: Oxygen Activity Tolerance: Patient tolerated treatment well Patient left: in bed;with call bell/phone within reach Nurse Communication: Mobility status PT Visit Diagnosis: Difficulty in walking, not elsewhere classified (R26.2)     Time: 2248-2500 PT Time Calculation (min) (ACUTE ONLY): 29 min  Charges:  $Gait Training: 23-37 mins                     [place]  Office [phone]    Claretha Cooper 06/24/2019, 5:15 PM

## 2019-06-24 NOTE — Progress Notes (Signed)
Patient doing well. O2 @ 1L [place] sating between 94-97%. No co/ of pain or distress. CT scan completed today. Will continue to monitor.

## 2019-06-24 NOTE — Plan of Care (Signed)
Patient appropriate and cooperative but does not seem to want to mobilize much or participate in hygiene activities. Will encourage self-care and participation.

## 2019-06-24 NOTE — Progress Notes (Addendum)
PROGRESS NOTE  Jason [name]  EZM:[id number] DOB: 1941-07-31 DOA: 06/16/2019 PCP: Lujean Amel, MD  Brief Narrative: Jason [name] is a 78 y.o. male with a history of COPD, HTN, recently diagnosed recurrent PE now on xarelto, HLD, and HOH who presented to the ED with 1 week (starting 8/5) of progressive shortness of breath, nonproductive cough, and right-sided pleuritic chest pain. He was hypoxic with positive covid-19 assay, elevated inflammatory markers and bilateral CXR infiltrates. Remdesivir and steroids started 8/12, tocilizumab given 8/15.   Assessment & Plan: Principal Problem:   COVID-19 virus infection Active Problems:   Tobacco abuse   Pulmonary embolism (HCC)   Acute respiratory failure with hypoxia (HCC)   Bilateral pneumonia   CAD (coronary artery disease)   Hyperlipidemia  Acute hypoxic respiratory failure due to covid-19 pneumonia:  - Continue PT/OT and checking ambulatory pulse oximetry to inform oxygen weaning efforts. - Completed remdesivir x5 days - Continue steroids x10 days - Given tocilizumab 8/15.  - Trend inflammatory markers  Elevated d-dimer:  - LE venous U/S without DVT and respiratory symptoms are improving, not consistent with acute VTE, which is unlikely on full dose anticoagulation. Elevated d-dimer is to be expected with covid-19 infection, though this increased precipitously without concomitant rise in other inflammatory markers. Will evaluate for presence/extension/recurrence of PE with CTA chest today. If new/extended clot burden found, would need to switch to coumadin.   ADDENDUM: CTA Chest did not show acute PE, confirmed chronic PE, suggestion of PAH, cicatrization/fibrosis/bronchiectatic change and evidence of prior granulomatous disease. There are multiple nodular densities which may require further follow up after resolution of this illness. Per radiology report: "There is a stable 4 mm nodular opacity in the medial segment right lower lobe seen on  axial slice 503 series 6. A 2 mm nodular lesion in this area is seen on axial slice 546 series 6. There is a 2 mm nodular opacity in the medial aspect of the superior segment of the right lower lobe seen on axial slice 568 series 6...Marland KitchenMultiple small nodular opacities raise concern for potential underlying metastatic foci."  History of recurrent PE:  - Lifelong anticoagulation with xarelto, reports full compliance.   COPD: No wheezing - Continue steroids as above, prn albuterol MDI  Positive FOBT: Without gross bleeding. Hgb stable, no anemia. Had colonoscopy 2015 with polyp and diverticuli.  - Monitor intermittently.  Hyperlipidemia:  - Continue statin  HTN:  - Hold home medications as he's normotensive  Anxiety, depression:  - Continue librium prn, trazodone qHS  DVT prophylaxis: Xarelto Code Status: DNR Family Communication: Will contact patient's son today. Disposition Plan: Pending further work up, will be stable for discharge to SNF in next 24-48 hours. CSW consulted.   Consultants:   None  Procedures:   None  Antimicrobials:  Remdesivir   Subjective: Still short of breath on exertion, stable, moderate, constant. Denies pleuritic chest pain.  Objective: Vitals:   06/23/19 1545 06/23/19 2000 06/24/19 0445 06/24/19 0833  BP: 115/74 95/66 113/65 109/71  Pulse: 73 65 (!) 55 92  Resp:  20 (!) 22   Temp: 98.2 F (36.8 C) 98.3 F (36.8 C) 98.3 F (36.8 C) 97.8 F (36.6 C)  TempSrc: Oral Oral Oral Oral  SpO2: 100% 99% 98% 99%  Weight:      Height:        Intake/Output Summary (Last 24 hours) at 06/24/2019 1006 Last data filed at 06/24/2019 1000 Gross per 24 hour  Intake 690 ml  Output 1695 ml  Net -1005 ml   Filed Weights   06/16/19 1233 06/16/19 2120  Weight: 86.2 kg 82.5 kg   Gen: chronically ill-appearing male in no distress Pulm: Nonlabored breathing supplemental oxygen. Crackles bilaterally. CV: Regular rate and rhythm. No murmur, rub, or  gallop. No JVD, no dependent edema. GI: Abdomen soft, non-tender, non-distended, with normoactive bowel sounds.  Ext: Warm, no deformities. Negative homan's. Skin: No new rashes, lesions or ulcers on visualized skin. Neuro: Alert and oriented. HOH. No focal neurological deficits. Psych: Judgement and insight appear fair. Mood euthymic & affect congruent. Behavior is appropriate.    Data Reviewed: I have personally reviewed following labs and imaging studies  CBC: Recent Labs  Lab 06/18/19 0104 06/24/19 0235  WBC 8.7 9.1  NEUTROABS  --  6.8  HGB 11.2* 13.8  HCT 33.9* 41.0  MCV 99.1 100.7*  PLT 412* 245   Basic Metabolic Panel: Recent Labs  Lab 06/18/19 0104 06/19/19 0300 06/20/19 0117 06/21/19 0300 06/24/19 0235  NA 137 140 141  --  138  K 3.4* 4.9 4.3  --  4.3  CL 102 108 109  --  104  CO2 22 23 24   --  25  GLUCOSE 194* 160* 127*  --  102*  BUN 31* 34* 33*  --  24*  CREATININE 0.87 0.89 0.79  --  0.87  CALCIUM 8.6* 8.7* 8.6*  --  8.5*  MG 1.9 2.0 1.9 1.9  --   PHOS 2.4* 2.8 2.7 3.4  --    GFR: Estimated Creatinine Clearance: 71.1 mL/min (by C-G formula based on SCr of 0.87 mg/dL). Liver Function Tests: Recent Labs  Lab 06/18/19 0104 06/19/19 0300 06/20/19 0117 06/24/19 0235  AST 35 38 121* 36  ALT 27 31 116* 81*  ALKPHOS 46 45 49 48  BILITOT 0.4 0.4 0.3 0.6  PROT 5.7* 6.3* 6.0* 6.1*  ALBUMIN 2.1* 2.2* 2.3* 2.6*   No results for input(s): LIPASE, AMYLASE in the last 168 hours. No results for input(s): AMMONIA in the last 168 hours. Coagulation Profile: No results for input(s): INR, PROTIME in the last 168 hours. Cardiac Enzymes: Recent Labs  Lab 06/18/19 0104 06/19/19 0300 06/20/19 0117 06/21/19 0300  CKTOTAL 78 74 30* 20*   BNP (last 3 results) No results for input(s): PROBNP in the last 8760 hours. HbA1C: No results for input(s): HGBA1C in the last 72 hours. CBG: No results for input(s): GLUCAP in the last 168 hours. Lipid Profile: No  results for input(s): CHOL, HDL, LDLCALC, TRIG, CHOLHDL, LDLDIRECT in the last 72 hours. Thyroid Function Tests: No results for input(s): TSH, T4TOTAL, FREET4, T3FREE, THYROIDAB in the last 72 hours. Anemia Panel: No results for input(s): VITAMINB12, FOLATE, FERRITIN, TIBC, IRON, RETICCTPCT in the last 72 hours. Urine analysis:    Component Value Date/Time   COLORURINE YELLOW 05/31/2019 2218   APPEARANCEUR CLEAR 05/31/2019 2218   LABSPEC >1.030 (H) 05/31/2019 2218   PHURINE 5.5 05/31/2019 2218   GLUCOSEU NEGATIVE 05/31/2019 2218   HGBUR SMALL (A) 05/31/2019 2218   BILIRUBINUR MODERATE (A) 05/31/2019 2218   KETONESUR 40 (A) 05/31/2019 2218   PROTEINUR 100 (A) 05/31/2019 2218   NITRITE NEGATIVE 05/31/2019 2218   LEUKOCYTESUR NEGATIVE 05/31/2019 2218   Recent Results (from the past 240 hour(s))  SARS Coronavirus 2 [facility] order, Performed in [facility] hospital lab) Nasopharyngeal Nasopharyngeal Swab     Status: Abnormal   Collection Time: 06/16/19  1:20 PM   Specimen: Nasopharyngeal Swab  Result Value Ref Range  Status   SARS Coronavirus 2 POSITIVE (A) NEGATIVE Final    Comment: RESULT CALLED TO, READ BACK BY AND VERIFIED WITH: P. Pulliam RN 15:35 06/16/19 ([email or web address]) (NOTE) If result is NEGATIVE SARS-CoV-2 target nucleic acids are NOT DETECTED. The SARS-CoV-2 RNA is generally detectable in upper and lower  respiratory specimens during the acute phase of infection. The lowest  concentration of SARS-CoV-2 viral copies this assay can detect is 250  copies / mL. A negative result does not preclude SARS-CoV-2 infection  and should not be used as the sole basis for treatment or other  patient management decisions.  A negative result may occur with  improper specimen collection / handling, submission of specimen other  than nasopharyngeal swab, presence of viral mutation(s) within the  areas targeted by this assay, and inadequate number of viral copies  (<250 copies / mL). A negative  result must be combined with clinical  observations, patient history, and epidemiological information. If result is POSITIVE SARS-CoV-2 target nucleic acids are DETECTED.  The SARS-CoV-2 RNA is generally detectable in upper and lower  respiratory specimens during the acute phase of infection.  Positive  results are indicative of active infection with SARS-CoV-2.  Clinical  correlation with patient history and other diagnostic information is  necessary to determine patient infection status.  Positive results do  not rule out bacterial infection or co-infection with other viruses. If result is PRESUMPTIVE POSTIVE SARS-CoV-2 nucleic acids MAY BE PRESENT.   A presumptive positive result was obtained on the submitted specimen  and confirmed on repeat testing.  While 2019 novel coronavirus  (SARS-CoV-2) nucleic acids may be present in the submitted sample  additional confirmatory testing may be necessary for epidemiological  and / or clinical management purposes  to differentiate between  SARS-CoV-2 and other Sarbecovirus currently known to infect humans.  If clinically indicated additional testing with an alternate test  methodology [phone])  is advised. The SARS-CoV-2 RNA is generally  detectable in upper and lower respiratory specimens during the acute  phase of infection. The expected result is Negative. Fact Sheet for Patients:  StrictlyIdeas.no Fact Sheet for Healthcare Providers: BankingDealers.co.za This test is not yet approved or cleared by the [place] FDA and has been authorized for detection and/or diagnosis of SARS-CoV-2 by FDA under an Emergency Use Authorization (EUA).  This EUA will remain in effect (meaning this test can be used) for the duration of the COVID-19 declaration under Section 564(b)(1) of the Act, 21 U.S.C. section 360bbb-3(b)(1), unless the authorization is terminated or revoked sooner. Performed at [facility], [place] [address] [place], [place] [address]   Culture, blood (routine x 2)     Status: None   Collection Time: 06/16/19  3:00 PM   Specimen: BLOOD RIGHT WRIST  Result Value Ref Range Status   Specimen Description BLOOD RIGHT WRIST  Final   Special Requests   Final    BOTTLES DRAWN AEROBIC AND ANAEROBIC Blood Culture adequate volume   Culture   Final    NO GROWTH 5 DAYS Performed at [facility], [place] [address] [place],  [address]    Report Status 06/21/2019 FINAL  Final  Culture, blood (routine x 2)     Status: None   Collection Time: 06/16/19  3:05 PM   Specimen: BLOOD  Result Value Ref Range Status   Specimen Description BLOOD LEFT ANTECUBITAL  Final   Special Requests   Final    BOTTLES DRAWN AEROBIC AND ANAEROBIC  Blood Culture adequate volume   Culture   Final    NO GROWTH 5 DAYS Performed at [facility], [place] [address] [place], [place] [address]    Report Status 06/21/2019 FINAL  Final  Respiratory Panel by PCR     Status: None   Collection Time: 06/16/19  9:55 PM   Specimen: Nasopharyngeal Swab; Respiratory  Result Value Ref Range Status   Adenovirus NOT DETECTED NOT DETECTED Final   Coronavirus 229E NOT DETECTED NOT DETECTED Final    Comment: (NOTE) The Coronavirus on the Respiratory Panel, DOES NOT test for the novel  Coronavirus (2019 nCoV)    Coronavirus HKU1 NOT DETECTED NOT DETECTED Final   Coronavirus NL63 NOT DETECTED NOT DETECTED Final   Coronavirus OC43 NOT DETECTED NOT DETECTED Final   Metapneumovirus NOT DETECTED NOT DETECTED Final   Rhinovirus / Enterovirus NOT DETECTED NOT DETECTED Final   Influenza A NOT DETECTED NOT DETECTED Final   Influenza B NOT DETECTED NOT DETECTED Final   Parainfluenza Virus 1 NOT DETECTED NOT DETECTED Final   Parainfluenza Virus 2 NOT DETECTED NOT DETECTED Final   Parainfluenza Virus 3 NOT DETECTED NOT DETECTED Final   Parainfluenza Virus 4 NOT DETECTED NOT DETECTED Final   Respiratory  Syncytial Virus NOT DETECTED NOT DETECTED Final   Bordetella pertussis NOT DETECTED NOT DETECTED Final   Chlamydophila pneumoniae NOT DETECTED NOT DETECTED Final   Mycoplasma pneumoniae NOT DETECTED NOT DETECTED Final    Comment: Performed at [facility], [place] [address], [place], [place] [address]  C difficile quick scan w PCR reflex     Status: None   Collection Time: 06/16/19 10:05 PM   Specimen: STOOL  Result Value Ref Range Status   C Diff antigen NEGATIVE NEGATIVE Final   C Diff toxin NEGATIVE NEGATIVE Final   C Diff interpretation No C. difficile detected.  Final    Comment: Performed at [facility], [place] [address], [place], [place] [address]      Radiology Studies: Vas [place] Lower Extremity Venous (dvt) Result Date: 06/22/2019 Summary: Right: No evidence of deep vein thrombosis in the lower extremity. No indirect evidence of obstruction proximal to the inguinal ligament. There is no evidence of deep vein thrombosis in the lower extremity. No cystic structure found in the popliteal fossa. Left: No evidence of deep vein thrombosis in the lower extremity. No indirect evidence of obstruction proximal to the inguinal ligament. There is no evidence of deep vein thrombosis in the lower extremity. No cystic structure found in the popliteal fossa.  *See table(s) above for measurements and observations. Electronically signed by Deitra Mayo MD on 06/22/2019 at 3:34:05 PM.    Final     Scheduled Meds:  albuterol  2 puff Inhalation Q6H   cholecalciferol  2,000 Units Oral Daily   dexamethasone  6 mg Oral Daily   multivitamin with minerals  1 tablet Oral Daily   pravastatin  80 mg Oral QHS   rivaroxaban  20 mg Oral QPC supper   traZODone  100 mg Oral QHS   Continuous Infusions:   LOS: 8 days   Time spent: 35 minutes.  Patrecia Pour, MD [facility] [email or web address] Password [email or web address] 06/24/2019, 10:06 AM

## 2019-06-25 ENCOUNTER — Telehealth: Payer: Self-pay | Admitting: Internal Medicine

## 2019-06-25 LAB — COMPREHENSIVE METABOLIC PANEL
ALT: 83 U/L — ABNORMAL HIGH (ref 0–44)
AST: 34 U/L (ref 15–41)
Albumin: 2.5 g/dL — ABNORMAL LOW (ref 3.5–5.0)
Alkaline Phosphatase: 46 U/L (ref 38–126)
Anion gap: 8 (ref 5–15)
BUN: 26 mg/dL — ABNORMAL HIGH (ref 8–23)
CO2: 21 mmol/L — ABNORMAL LOW (ref 22–32)
Calcium: 8.7 mg/dL — ABNORMAL LOW (ref 8.9–10.3)
Chloride: 110 mmol/L (ref 98–111)
Creatinine, Ser: 0.83 mg/dL (ref 0.61–1.24)
GFR calc Af Amer: >60 mL/min (ref >60)
GFR calc non Af Amer: >60 mL/min (ref >60)
Glucose, Bld: 118 mg/dL — ABNORMAL HIGH (ref 70–99)
Potassium: 4.1 mmol/L (ref 3.5–5.1)
Sodium: 139 mmol/L (ref 135–145)
Total Bilirubin: 0.5 mg/dL (ref 0.3–1.2)
Total Protein: 5.8 g/dL — ABNORMAL LOW (ref 6.5–8.1)

## 2019-06-25 LAB — C-REACTIVE PROTEIN: CRP: <0.8 mg/dL (ref <1.0)

## 2019-06-25 LAB — D-DIMER, QUANTITATIVE: D-Dimer, Quant: 15.3 ug/mL-FEU — ABNORMAL HIGH (ref 0.00–0.50)

## 2019-06-25 NOTE — TOC Initial Note (Addendum)
Transition of Care [facility]) - Initial/Assessment Note    Patient Details  Name: Jason [name] MRN: [id number] Date of Birth: 12/11/1940  Transition of Care [facility]) CM/SW Contact:    Alberteen Sam, [place] Phone Number: [phone] 06/25/2019, 2:17 PM  Clinical Narrative:                  CSW initially attempted to speak with patient regarding SNF, however very hard of hearing. Nurse attempted to assist as well, however attempts unsuccessful. CSW consulted with patient's son Leory Plowman who reports patient will need to go to SNF as his whole family has COVID and is unable to care for him at this time. He reports no preference of SNF and agrees for CSW to fax out referrals to [facility] for bed offers.   [facility] has accepted and started insurance authorization today.    Expected Discharge Plan: Skilled Nursing Facility Barriers to Discharge: Continued Medical Work up   Patient Goals and CMS Choice   CMS Medicare.gov Compare Post Acute Care list provided to:: Patient Represenative (must comment)(son Leory Plowman) Choice offered to / list presented to : Adult Children  Expected Discharge Plan and Services Expected Discharge Plan: [place] Acute Care Choice: [place] Living arrangements for the past 2 months: Single Family Home                                      Prior Living Arrangements/Services Living arrangements for the past 2 months: Single Family Home Lives with:: Self Patient language and need for interpreter reviewed:: Yes Do you feel safe going back to the place where you live?: No   needs short term rehab  Need for Family Participation in Patient Care: Yes (Comment) Care giver support system in place?: Yes (comment)   Criminal Activity/Legal Involvement Pertinent to Current Situation/Hospitalization: No - Comment as needed  Activities of Daily Living Home Assistive Devices/Equipment: None ADL Screening (condition at time of  admission) Patient's cognitive ability adequate to safely complete daily activities?: Yes Is the patient deaf or have difficulty hearing?: Yes Does the patient have difficulty seeing, even when wearing glasses/contacts?: No Does the patient have difficulty concentrating, remembering, or making decisions?: No Patient able to express need for assistance with ADLs?: Yes Does the patient have difficulty dressing or bathing?: No Independently performs ADLs?: Yes (appropriate for developmental age) Does the patient have difficulty walking or climbing stairs?: No Weakness of Legs: Both Weakness of Arms/Hands: None  Permission Sought/Granted Permission sought to share information with : Case Manager, Customer service manager, Family Supports Permission granted to share information with : Yes, Verbal Permission Granted  Share Information with NAME: Leory Plowman  Permission granted to share info w AGENCY: SNFs  Permission granted to share info w Relationship: son  Permission granted to share info w Contact Information: [phone]  Emotional Assessment Appearance:: Other (Comment Required(unable to assess - remote) Attitude/Demeanor/Rapport: Gracious Affect (typically observed): Calm Orientation: : Oriented to Self, Oriented to Place, Oriented to  Time, Oriented to Situation Alcohol / Substance Use: Not Applicable Psych Involvement: No (comment)  Admission diagnosis:  Pneumonia of both lower lobes due to infectious organism [facility]) [J18.1] Patient Active Problem List   Diagnosis Date Noted  . COVID-19 virus infection 06/17/2019  . CAD (coronary artery disease) 06/17/2019  . Hyperlipidemia 06/17/2019  . Bilateral pneumonia 06/16/2019  . Pulmonary embolism ([place]) 04/13/2019  .  Acute respiratory failure with hypoxia ([place])   . Pulmonary emboli ([place]) 04/12/2019  . COPD without exacerbation ([place]) 04/12/2019  . Elevated troponin 04/12/2019  . Pulmonary nodule 04/12/2019  . Tobacco abuse  04/12/2019  . Alcohol abuse 04/12/2019   PCP:  Lujean Amel, MD Pharmacy:   [facility] [address], [place] - [address] [place] [place] [address] Phone: [phone] Fax: [phone]     Social Determinants of Health (SDOH) Interventions    Readmission Risk Interventions No flowsheet data found.

## 2019-06-25 NOTE — Telephone Encounter (Signed)
Attempted to call back, number given was general number to operator. I asked to be transferred to patient unit to speak with nurse however phone just rang and rang. Nothing is needed.   Patient is currently admitted to [facility] due to covid. Will route message to CY as FYI.

## 2019-06-25 NOTE — Progress Notes (Signed)
PROGRESS NOTE  Jason [name]  [id number] DOB: 10-27-1941 DOA: 06/16/2019 PCP: Lujean Amel, MD  Brief Narrative: Jason [name] is a 78 y.o. male with a history of COPD, HTN, recently diagnosed recurrent PE now on xarelto, HLD, and HOH who presented to the ED with 1 week (starting 8/5) of progressive shortness of breath, nonproductive cough, and right-sided pleuritic chest pain. He was hypoxic with positive covid-19 assay, elevated inflammatory markers and bilateral CXR infiltrates. Remdesivir and steroids started 8/12, tocilizumab given 8/15.   Assessment & Plan: Principal Problem:   COVID-19 virus infection Active Problems:   Tobacco abuse   Pulmonary embolism (HCC)   Acute respiratory failure with hypoxia (HCC)   Bilateral pneumonia   CAD (coronary artery disease)   Hyperlipidemia  Acute hypoxic respiratory failure due to covid-19 pneumonia:  - Continue PT/OT and checking ambulatory pulse oximetry to inform oxygen weaning efforts. - Completed remdesivir x5 days - Complete steroids x10 days on 8/21. Inflammatory markers showed sustained improvement and will no longer be trended. - Given tocilizumab 8/15.   Elevated d-dimer: Improving, no new clots on CTA chest or LE venous doppler.  - Continue xarelto.  - Will not continue trending as this would not change management.   Pulmonary nodules: CTA Chest personally reviewe3d in addition to formal radiology report. I see diffuse GGOs with fibrosis and scarring diffusely with nonocclusive clot. It did not show acute PE, confirmed chronic PE, suggestion of PAH, cicatrization/fibrosis/bronchiectatic change and evidence of prior granulomatous disease. Per radiology report: "There is a stable 4 mm nodular opacity in the medial segment right lower lobe seen on axial slice 123456 series 6. A 2 mm nodular lesion in this area is seen on axial slice 123456 series 6. There is a 2 mm nodular opacity in the medial aspect of the superior segment of the right  lower lobe seen on axial slice 123XX123 series 6...Marland KitchenMultiple small nodular opacities raise concern for potential underlying metastatic foci." - Follow up imaging recommended after resolution of this illness.   History of recurrent PE, chronic PE with pulmonary hypertension:  - Lifelong anticoagulation with xarelto, reports full compliance and CTA chest does demonstrate resorption of some clots, no new clots.  COPD: No wheezing - Completing 10 days steroids, prn albuterol MDI  Positive FOBT: Without gross bleeding. Hgb stable, no anemia. Had colonoscopy 2015 with polyp and diverticuli.  - Monitor intermittently.  Hyperlipidemia:  - Continue statin  HTN:  - Hold home medications as he's normotensive  Anxiety, depression:  - Continue librium prn, trazodone qHS  LFT elevation: Mildly elevated ALT.  - Recheck at follow up.  DVT prophylaxis: Xarelto Code Status: DNR Family Communication: D/w patient son yesterday, will call again today. Disposition Plan: Pending further work up, will be stable for discharge to SNF in next 24-48 hours. CSW consulted.   Consultants:   None  Procedures:   None  Antimicrobials:  Remdesivir   Subjective: Feels dyspnea is improving, mild with exertion just to get to bathroom takes several pause breaks but oxygen requirement coming down. No fevers or chest pain.   Objective: Vitals:   06/24/19 1717 06/24/19 2000 06/24/19 2006 06/25/19 0901  BP: 108/66 102/64  101/70  Pulse: 74 60  88  Resp: 20 20  20   Temp: 98.4 F (36.9 C) 98.5 F (36.9 C) 98.5 F (36.9 C) 98.1 F (36.7 C)  TempSrc: Oral Oral Oral Oral  SpO2: 96% 97%  95%  Weight:      Height:  Intake/Output Summary (Last 24 hours) at 06/25/2019 1107 Last data filed at 06/25/2019 [id number] Gross per 24 hour  Intake 480 ml  Output 550 ml  Net -70 ml   Filed Weights   06/16/19 1233 06/16/19 2120  Weight: 86.2 kg 82.5 kg   Gen: Chronically ill-appearing male in no distress Pulm:  Nonlabored breathing supplemental oxygen, diffuse crackles. CV: Regular rate and rhythm. No murmur, rub, or gallop. No JVD, no dependent edema. GI: Abdomen soft, non-tender, non-distended, with normoactive bowel sounds.  Ext: Warm, no deformities Skin: No rashes, lesions or ulcers on visualized skin. Neuro: Alert and oriented. [facility]. No focal neurological deficits. Psych: Judgement and insight appear fair. Mood euthymic & affect congruent. Behavior is appropriate.    Data Reviewed: I have personally reviewed following labs and imaging studies  CBC: Recent Labs  Lab 06/24/19 0235  WBC 9.1  NEUTROABS 6.8  HGB 13.8  HCT 41.0  MCV 100.7*  PLT [id number]   Basic Metabolic Panel: Recent Labs  Lab 06/19/19 0300 06/20/19 0117 06/21/19 0300 06/24/19 0235 06/25/19 0110  NA 140 141  --  138 139  K 4.9 4.3  --  4.3 4.1  CL 108 109  --  104 110  CO2 23 24  --  25 21*  GLUCOSE 160* 127*  --  102* 118*  BUN 34* 33*  --  24* 26*  CREATININE 0.89 0.79  --  0.87 0.83  CALCIUM 8.7* 8.6*  --  8.5* 8.7*  MG 2.0 1.9 1.9  --   --   PHOS 2.8 2.7 3.4  --   --    GFR: Estimated Creatinine Clearance: 74.5 mL/min (by C-G formula based on SCr of 0.83 mg/dL). Liver Function Tests: Recent Labs  Lab 06/19/19 0300 06/20/19 0117 06/24/19 0235 06/25/19 0110  AST 38 121* 36 34  ALT 31 116* 81* 83*  ALKPHOS 45 49 48 46  BILITOT 0.4 0.3 0.6 0.5  PROT 6.3* 6.0* 6.1* 5.8*  ALBUMIN 2.2* 2.3* 2.6* 2.5*   No results for input(s): LIPASE, AMYLASE in the last 168 hours. No results for input(s): AMMONIA in the last 168 hours. Coagulation Profile: No results for input(s): INR, PROTIME in the last 168 hours. Cardiac Enzymes: Recent Labs  Lab 06/19/19 0300 06/20/19 0117 06/21/19 0300  CKTOTAL 74 30* 20*   BNP (last 3 results) No results for input(s): PROBNP in the last 8760 hours. HbA1C: No results for input(s): HGBA1C in the last 72 hours. CBG: No results for input(s): GLUCAP in the last 168  hours. Lipid Profile: No results for input(s): CHOL, HDL, LDLCALC, TRIG, CHOLHDL, LDLDIRECT in the last 72 hours. Thyroid Function Tests: No results for input(s): TSH, T4TOTAL, FREET4, T3FREE, THYROIDAB in the last 72 hours. Anemia Panel: No results for input(s): VITAMINB12, FOLATE, FERRITIN, TIBC, IRON, RETICCTPCT in the last 72 hours. Urine analysis:    Component Value Date/Time   COLORURINE YELLOW 05/31/2019 2218   APPEARANCEUR CLEAR 05/31/2019 2218   LABSPEC >1.030 (H) 05/31/2019 2218   PHURINE 5.5 05/31/2019 2218   GLUCOSEU NEGATIVE 05/31/2019 2218   HGBUR SMALL (A) 05/31/2019 2218   BILIRUBINUR MODERATE (A) 05/31/2019 2218   KETONESUR 40 (A) 05/31/2019 2218   PROTEINUR 100 (A) 05/31/2019 2218   NITRITE NEGATIVE 05/31/2019 2218   LEUKOCYTESUR NEGATIVE 05/31/2019 2218   Recent Results (from the past 240 hour(s))  SARS Coronavirus 2 [facility] order, Performed in [facility] hospital lab) Nasopharyngeal Nasopharyngeal Swab     Status: Abnormal  Collection Time: 06/16/19  1:20 PM   Specimen: Nasopharyngeal Swab  Result Value Ref Range Status   SARS Coronavirus 2 POSITIVE (A) NEGATIVE Final    Comment: RESULT CALLED TO, READ BACK BY AND VERIFIED WITH: P. Pulliam RN 15:35 06/16/19 ([email or web address]) (NOTE) If result is NEGATIVE SARS-CoV-2 target nucleic acids are NOT DETECTED. The SARS-CoV-2 RNA is generally detectable in upper and lower  respiratory specimens during the acute phase of infection. The lowest  concentration of SARS-CoV-2 viral copies this assay can detect is 250  copies / mL. A negative result does not preclude SARS-CoV-2 infection  and should not be used as the sole basis for treatment or other  patient management decisions.  A negative result may occur with  improper specimen collection / handling, submission of specimen other  than nasopharyngeal swab, presence of viral mutation(s) within the  areas targeted by this assay, and inadequate number of viral copies  (<250  copies / mL). A negative result must be combined with clinical  observations, patient history, and epidemiological information. If result is POSITIVE SARS-CoV-2 target nucleic acids are DETECTED.  The SARS-CoV-2 RNA is generally detectable in upper and lower  respiratory specimens during the acute phase of infection.  Positive  results are indicative of active infection with SARS-CoV-2.  Clinical  correlation with patient history and other diagnostic information is  necessary to determine patient infection status.  Positive results do  not rule out bacterial infection or co-infection with other viruses. If result is PRESUMPTIVE POSTIVE SARS-CoV-2 nucleic acids MAY BE PRESENT.   A presumptive positive result was obtained on the submitted specimen  and confirmed on repeat testing.  While 2019 novel coronavirus  (SARS-CoV-2) nucleic acids may be present in the submitted sample  additional confirmatory testing may be necessary for epidemiological  and / or clinical management purposes  to differentiate between  SARS-CoV-2 and other Sarbecovirus currently known to infect humans.  If clinically indicated additional testing with an alternate test  methodology [phone])  is advised. The SARS-CoV-2 RNA is generally  detectable in upper and lower respiratory specimens during the acute  phase of infection. The expected result is Negative. Fact Sheet for Patients:  StrictlyIdeas.no Fact Sheet for Healthcare Providers: BankingDealers.co.za This test is not yet approved or cleared by the [place] FDA and has been authorized for detection and/or diagnosis of SARS-CoV-2 by FDA under an Emergency Use Authorization (EUA).  This EUA will remain in effect (meaning this test can be used) for the duration of the COVID-19 declaration under Section 564(b)(1) of the Act, 21 U.S.C. section 360bbb-3(b)(1), unless the authorization is terminated or revoked  sooner. Performed at [facility], [place] [address], [place], [place] [address]   Culture, blood (routine x 2)     Status: None   Collection Time: 06/16/19  3:00 PM   Specimen: BLOOD RIGHT WRIST  Result Value Ref Range Status   Specimen Description BLOOD RIGHT WRIST  Final   Special Requests   Final    BOTTLES DRAWN AEROBIC AND ANAEROBIC Blood Culture adequate volume   Culture   Final    NO GROWTH 5 DAYS Performed at [facility], [place] [address] [place], [place] [address]    Report Status 06/21/2019 FINAL  Final  Culture, blood (routine x 2)     Status: None   Collection Time: 06/16/19  3:05 PM   Specimen: BLOOD  Result Value Ref Range Status   Specimen Description BLOOD LEFT ANTECUBITAL  Final   Special Requests   Final    BOTTLES DRAWN AEROBIC AND ANAEROBIC Blood Culture adequate volume   Culture   Final    NO GROWTH 5 DAYS Performed at [facility], [address], [place], [place] [address]    Report Status 06/21/2019 FINAL  Final  Respiratory Panel by PCR     Status: None   Collection Time: 06/16/19  9:55 PM   Specimen: Nasopharyngeal Swab; Respiratory  Result Value Ref Range Status   Adenovirus NOT DETECTED NOT DETECTED Final   Coronavirus 229E NOT DETECTED NOT DETECTED Final    Comment: (NOTE) The Coronavirus on the Respiratory Panel, DOES NOT test for the novel  Coronavirus (2019 nCoV)    Coronavirus HKU1 NOT DETECTED NOT DETECTED Final   Coronavirus NL63 NOT DETECTED NOT DETECTED Final   Coronavirus OC43 NOT DETECTED NOT DETECTED Final   Metapneumovirus NOT DETECTED NOT DETECTED Final   Rhinovirus / Enterovirus NOT DETECTED NOT DETECTED Final   Influenza A NOT DETECTED NOT DETECTED Final   Influenza B NOT DETECTED NOT DETECTED Final   Parainfluenza Virus 1 NOT DETECTED NOT DETECTED Final   Parainfluenza Virus 2 NOT DETECTED NOT DETECTED Final   Parainfluenza Virus 3 NOT DETECTED NOT DETECTED Final   Parainfluenza Virus 4 NOT DETECTED NOT  DETECTED Final   Respiratory Syncytial Virus NOT DETECTED NOT DETECTED Final   Bordetella pertussis NOT DETECTED NOT DETECTED Final   Chlamydophila pneumoniae NOT DETECTED NOT DETECTED Final   Mycoplasma pneumoniae NOT DETECTED NOT DETECTED Final    Comment: Performed at [facility], [place] [address], [place], [place] [address]  C difficile quick scan w PCR reflex     Status: None   Collection Time: 06/16/19 10:05 PM   Specimen: STOOL  Result Value Ref Range Status   C Diff antigen NEGATIVE NEGATIVE Final   C Diff toxin NEGATIVE NEGATIVE Final   C Diff interpretation No C. difficile detected.  Final    Comment: Performed at [facility], [place] [address] [place], [place] [address]      Radiology Studies: Vas [place] Lower Extremity Venous (dvt) Result Date: 06/22/2019 Summary: Right: No evidence of deep vein thrombosis in the lower extremity. No indirect evidence of obstruction proximal to the inguinal ligament. There is no evidence of deep vein thrombosis in the lower extremity. No cystic structure found in the popliteal fossa. Left: No evidence of deep vein thrombosis in the lower extremity. No indirect evidence of obstruction proximal to the inguinal ligament. There is no evidence of deep vein thrombosis in the lower extremity. No cystic structure found in the popliteal fossa.  *See table(s) above for measurements and observations. Electronically signed by Deitra Mayo MD on 06/22/2019 at 3:34:05 PM.    Final     Scheduled Meds:  albuterol  2 puff Inhalation Q6H   cholecalciferol  2,000 Units Oral Daily   dexamethasone  6 mg Oral Daily   multivitamin with minerals  1 tablet Oral Daily   pravastatin  80 mg Oral QHS   rivaroxaban  20 mg Oral QPC supper   traZODone  100 mg Oral QHS   Continuous Infusions:   LOS: 9 days   Time spent: 35 minutes.  Patrecia Pour, MD [facility] [email or web address] Password [email or web address] 06/25/2019, 11:07 AM

## 2019-06-25 NOTE — NC FL2 (Signed)
[place] MEDICAID FL2 LEVEL OF CARE SCREENING TOOL     IDENTIFICATION  Patient Name: Jason [name] Birthdate: 05-Dec-1940 Sex: male Admission Date (Current Location): 06/16/2019  [facility] [place] Number:  Herbalist and Address:  [facility] [place] [facility], [place] [address] [place], [place] [address]([facility])      Provider Number: [id number]  Attending Physician Name and Address:  Patrecia Pour, MD  Relative Name and Phone Number:  Leory Plowman [phone]    Current Level of Care: Hospital Recommended Level of Care: [place] Prior Approval Number:    Date Approved/Denied:   PASRR Number: [id number]  Discharge Plan: SNF    Current Diagnoses: Patient Active Problem List   Diagnosis Date Noted  . COVID-19 virus infection 06/17/2019  . CAD (coronary artery disease) 06/17/2019  . Hyperlipidemia 06/17/2019  . Bilateral pneumonia 06/16/2019  . Pulmonary embolism ([place]) 04/13/2019  . Acute respiratory failure with hypoxia ([place])   . Pulmonary emboli ([place]) 04/12/2019  . COPD without exacerbation ([place]) 04/12/2019  . Elevated troponin 04/12/2019  . Pulmonary nodule 04/12/2019  . Tobacco abuse 04/12/2019  . Alcohol abuse 04/12/2019    Orientation RESPIRATION BLADDER Height & Weight     Self, Time, Situation, Place  O2(nasal cannula 1-2 L/m) Continent Weight: 181 lb 14.1 oz (82.5 kg) Height:  5\' 10"  (177.8 cm)  BEHAVIORAL SYMPTOMS/MOOD NEUROLOGICAL BOWEL NUTRITION STATUS      Continent Diet(see discharge summary)  AMBULATORY STATUS COMMUNICATION OF NEEDS Skin   Limited Assist Verbally Other (Comment)(ecchymosis right and left arms)                       Personal Care Assistance Level of Assistance  Bathing, Feeding, Dressing, Total care Bathing Assistance: Limited assistance Feeding assistance: Independent Dressing Assistance: Limited assistance Total Care Assistance: Limited assistance   Functional Limitations  Info  Sight, Hearing, Speech Sight Info: Adequate Hearing Info: Impaired(very hard of hearing - hearing aids) Speech Info: Adequate    SPECIAL CARE FACTORS FREQUENCY  PT (By licensed PT), OT (By licensed OT)     PT Frequency: min 5x weekly OT Frequency: min 5x weekly            Contractures Contractures Info: Not present    Additional Factors Info  Code Status, Allergies Code Status Info: DNR Allergies Info: No Known Allergies           Current Medications (06/25/2019):  This is the current hospital active medication list Current Facility-Administered Medications  Medication Dose Route Frequency Provider Last Rate Last Dose  . albuterol (VENTOLIN HFA) 108 (90 Base) MCG/ACT inhaler 2 puff  2 puff Inhalation Q6H Dessa Phi, DO   2 puff at 06/25/19 0900  . chlordiazePOXIDE (LIBRIUM) capsule 5 mg  5 mg Oral TID PRN Merton Border, MD   5 mg at 06/18/19 0829  . cholecalciferol (VITAMIN D3) tablet 2,000 Units  2,000 Units Oral Daily Merton Border, MD   2,000 Units at 06/25/19 1007  . multivitamin with minerals tablet 1 tablet  1 tablet Oral Daily Merton Border, MD   1 tablet at 06/25/19 1007  . pravastatin (PRAVACHOL) tablet 80 mg  80 mg Oral QHS Merton Border, MD   80 mg at 06/24/19 2228  . rivaroxaban (XARELTO) tablet 20 mg  20 mg Oral QPC supper Merton Border, MD   20 mg at 06/24/19 1722  . traZODone (DESYREL) tablet 100 mg  100 mg Oral QHS Merton Border,  MD   100 mg at 06/24/19 2228     Discharge Medications: Please see discharge summary for a list of discharge medications.  Relevant Imaging Results:  Relevant Lab Results:   Additional Information SSN: [id number]  Alberteen Sam, LCSW

## 2019-06-26 DIAGNOSIS — Z720 Tobacco use: Secondary | ICD-10-CM

## 2019-06-26 DIAGNOSIS — E785 Hyperlipidemia, unspecified: Secondary | ICD-10-CM

## 2019-06-26 NOTE — Progress Notes (Signed)
PROGRESS NOTE  Jason [name]  [id number] DOB: 11/27/1940 DOA: 06/16/2019 PCP: Lujean Amel, MD  Brief Narrative: Jason [name] is a 78 y.o. male with a history of COPD, HTN, recently diagnosed recurrent PE now on xarelto, HLD, and HOH who presented to the ED with 1 week (starting 8/5) of progressive shortness of breath, nonproductive cough, and right-sided pleuritic chest pain. He was hypoxic with positive covid-19 assay, elevated inflammatory markers and bilateral CXR infiltrates. Remdesivir and steroids started 8/12, tocilizumab given 8/15. Hypoxia has continued to improve slowly, though due to deconditioning acutely, SNF rehabilitation is recommended at discharge. The patient has been accepted at [facility], though insurance authorization is delaying discharge.  Assessment & Plan: Principal Problem:   COVID-19 virus infection Active Problems:   Tobacco abuse   Pulmonary embolism (HCC)   Acute respiratory failure with hypoxia (HCC)   Bilateral pneumonia   CAD (coronary artery disease)   Hyperlipidemia  Acute hypoxic respiratory failure due to covid-19 pneumonia:  - Continue supplemental oxygen as needed to maintain SpO2 90%. - Continue PT/OT. - Completed remdesivir x5 days - Complete steroids x10 days on 8/21. Inflammatory markers showed sustained improvement and will no longer be trended. - Given tocilizumab 8/15.   Elevated d-dimer: Improving, no new clots on CTA chest or LE venous doppler.  - Continue xarelto.  - Will not continue trending as this would not change management.   Pulmonary nodules: CTA Chest personally reviewe3d in addition to formal radiology report. I see diffuse GGOs with fibrosis and scarring diffusely with nonocclusive clot. It did not show acute PE, confirmed chronic PE, suggestion of PAH, cicatrization/fibrosis/bronchiectatic change and evidence of prior granulomatous disease. Per radiology report: "There is a stable 4 mm nodular opacity in the medial segment right  lower lobe seen on axial slice 123456 series 6. A 2 mm nodular lesion in this area is seen on axial slice 123456 series 6. There is a 2 mm nodular opacity in the medial aspect of the superior segment of the right lower lobe seen on axial slice 123XX123 series 6...Marland KitchenMultiple small nodular opacities raise concern for potential underlying metastatic foci." - Follow up imaging recommended after resolution of this illness.   History of recurrent PE, chronic PE with pulmonary hypertension:  - Lifelong anticoagulation with xarelto, reports full compliance and CTA chest does demonstrate resorption of some clots, no new clots.  COPD: No wheezing - Completing 10 days steroids, prn albuterol MDI  Positive FOBT: Without gross bleeding. Hgb stable, no anemia. Had colonoscopy 2015 with polyp and diverticuli.  - Monitor intermittently.  Hyperlipidemia:  - Continue statin  HTN:  - Hold home medications as he's normotensive  Anxiety, depression:  - Continue librium prn, trazodone qHS  LFT elevation: Mildly elevated ALT.  - Recheck at follow up.  DVT prophylaxis: Xarelto Code Status: DNR Family Communication: None at bedside, will contact family if any clinical updates are necessary. Disposition Plan: Medically stable for discharge to [facility] pending insurance authorization.   Consultants:   None  Procedures:   None  Antimicrobials:  Remdesivir   Subjective: Shortness of breath with exertion is about stable, none at rest. No chest pain or fevers. No leg swelling or orthopnea or bleeding.   Objective: Vitals:   06/25/19 1701 06/25/19 2000 06/26/19 0400 06/26/19 0800  BP: 114/74 105/66 116/71 103/66  Pulse: 66 66 (!) 51 (!) 57  Resp: 18 (!) 21 19   Temp: 98.7 F (37.1 C) 98.6 F (37 C) 98.2 F (36.8 C)  98.2 F (36.8 C)  TempSrc: Oral Oral Oral Oral  SpO2: 96% 99% 91% 91%  Weight:      Height:        Intake/Output Summary (Last 24 hours) at 06/26/2019 0925 Last data filed at  06/26/2019 0800 Gross per 24 hour  Intake 720 ml  Output 1075 ml  Net -355 ml   Filed Weights   06/16/19 1233 06/16/19 2120  Weight: 86.2 kg 82.5 kg   Gen: 78 y.o. male in no distress Pulm: Nonlabored breathing 1LPM. Crackles bilaterally. CV: Regular rate and rhythm. No murmur, rub, or gallop. No JVD, no dependent edema. GI: Abdomen soft, non-tender, non-distended, with normoactive bowel sounds.  Ext: Warm, no deformities Skin: No new rashes, lesions or ulcers on visualized skin. Neuro: Alert and oriented. +HOH. No focal neurological deficits. Psych: Judgement and insight appear fair. Mood euthymic & affect congruent. Behavior is appropriate.    Data Reviewed: I have personally reviewed following labs and imaging studies  CBC: Recent Labs  Lab 06/24/19 0235  WBC 9.1  NEUTROABS 6.8  HGB 13.8  HCT 41.0  MCV 100.7*  PLT Q000111Q   Basic Metabolic Panel: Recent Labs  Lab 06/20/19 0117 06/21/19 0300 06/24/19 0235 06/25/19 0110  NA 141  --  138 139  K 4.3  --  4.3 4.1  CL 109  --  104 110  CO2 24  --  25 21*  GLUCOSE 127*  --  102* 118*  BUN 33*  --  24* 26*  CREATININE 0.79  --  0.87 0.83  CALCIUM 8.6*  --  8.5* 8.7*  MG 1.9 1.9  --   --   PHOS 2.7 3.4  --   --    GFR: Estimated Creatinine Clearance: 74.5 mL/min (by C-G formula based on SCr of 0.83 mg/dL). Liver Function Tests: Recent Labs  Lab 06/20/19 0117 06/24/19 0235 06/25/19 0110  AST 121* 36 34  ALT 116* 81* 83*  ALKPHOS 49 48 46  BILITOT 0.3 0.6 0.5  PROT 6.0* 6.1* 5.8*  ALBUMIN 2.3* 2.6* 2.5*   No results for input(s): LIPASE, AMYLASE in the last 168 hours. No results for input(s): AMMONIA in the last 168 hours. Coagulation Profile: No results for input(s): INR, PROTIME in the last 168 hours. Cardiac Enzymes: Recent Labs  Lab 06/20/19 0117 06/21/19 0300  CKTOTAL 30* 20*   BNP (last 3 results) No results for input(s): PROBNP in the last 8760 hours. HbA1C: No results for input(s): HGBA1C in  the last 72 hours. CBG: No results for input(s): GLUCAP in the last 168 hours. Lipid Profile: No results for input(s): CHOL, HDL, LDLCALC, TRIG, CHOLHDL, LDLDIRECT in the last 72 hours. Thyroid Function Tests: No results for input(s): TSH, T4TOTAL, FREET4, T3FREE, THYROIDAB in the last 72 hours. Anemia Panel: No results for input(s): VITAMINB12, FOLATE, FERRITIN, TIBC, IRON, RETICCTPCT in the last 72 hours. Urine analysis:    Component Value Date/Time   COLORURINE YELLOW 05/31/2019 2218   APPEARANCEUR CLEAR 05/31/2019 2218   LABSPEC >1.030 (H) 05/31/2019 2218   PHURINE 5.5 05/31/2019 2218   GLUCOSEU NEGATIVE 05/31/2019 2218   HGBUR SMALL (A) 05/31/2019 2218   BILIRUBINUR MODERATE (A) 05/31/2019 2218   KETONESUR 40 (A) 05/31/2019 2218   PROTEINUR 100 (A) 05/31/2019 2218   NITRITE NEGATIVE 05/31/2019 2218   LEUKOCYTESUR NEGATIVE 05/31/2019 2218   Recent Results (from the past 240 hour(s))  SARS Coronavirus 2 [facility] order, Performed in [facility] hospital lab) Nasopharyngeal Nasopharyngeal Swab  Status: Abnormal   Collection Time: 06/16/19  1:20 PM   Specimen: Nasopharyngeal Swab  Result Value Ref Range Status   SARS Coronavirus 2 POSITIVE (A) NEGATIVE Final    Comment: RESULT CALLED TO, READ BACK BY AND VERIFIED WITH: P. Pulliam RN 15:35 06/16/19 ([email or web address]) (NOTE) If result is NEGATIVE SARS-CoV-2 target nucleic acids are NOT DETECTED. The SARS-CoV-2 RNA is generally detectable in upper and lower  respiratory specimens during the acute phase of infection. The lowest  concentration of SARS-CoV-2 viral copies this assay can detect is 250  copies / mL. A negative result does not preclude SARS-CoV-2 infection  and should not be used as the sole basis for treatment or other  patient management decisions.  A negative result may occur with  improper specimen collection / handling, submission of specimen other  than nasopharyngeal swab, presence of viral mutation(s) within the   areas targeted by this assay, and inadequate number of viral copies  (<250 copies / mL). A negative result must be combined with clinical  observations, patient history, and epidemiological information. If result is POSITIVE SARS-CoV-2 target nucleic acids are DETECTED.  The SARS-CoV-2 RNA is generally detectable in upper and lower  respiratory specimens during the acute phase of infection.  Positive  results are indicative of active infection with SARS-CoV-2.  Clinical  correlation with patient history and other diagnostic information is  necessary to determine patient infection status.  Positive results do  not rule out bacterial infection or co-infection with other viruses. If result is PRESUMPTIVE POSTIVE SARS-CoV-2 nucleic acids MAY BE PRESENT.   A presumptive positive result was obtained on the submitted specimen  and confirmed on repeat testing.  While 2019 novel coronavirus  (SARS-CoV-2) nucleic acids may be present in the submitted sample  additional confirmatory testing may be necessary for epidemiological  and / or clinical management purposes  to differentiate between  SARS-CoV-2 and other Sarbecovirus currently known to infect humans.  If clinically indicated additional testing with an alternate test  methodology [phone])  is advised. The SARS-CoV-2 RNA is generally  detectable in upper and lower respiratory specimens during the acute  phase of infection. The expected result is Negative. Fact Sheet for Patients:  StrictlyIdeas.no Fact Sheet for Healthcare Providers: BankingDealers.co.za This test is not yet approved or cleared by the [place] FDA and has been authorized for detection and/or diagnosis of SARS-CoV-2 by FDA under an Emergency Use Authorization (EUA).  This EUA will remain in effect (meaning this test can be used) for the duration of the COVID-19 declaration under Section 564(b)(1) of the Act, 21 U.S.C.  section 360bbb-3(b)(1), unless the authorization is terminated or revoked sooner. Performed at [facility], [place] [address] [place], [place] [address]   Culture, blood (routine x 2)     Status: None   Collection Time: 06/16/19  3:00 PM   Specimen: BLOOD RIGHT WRIST  Result Value Ref Range Status   Specimen Description BLOOD RIGHT WRIST  Final   Special Requests   Final    BOTTLES DRAWN AEROBIC AND ANAEROBIC Blood Culture adequate volume   Culture   Final    NO GROWTH 5 DAYS Performed at [facility], [place] [address], [place], [place] [address]    Report Status 06/21/2019 FINAL  Final  Culture, blood (routine x 2)     Status: None   Collection Time: 06/16/19  3:05 PM   Specimen: BLOOD  Result Value Ref Range Status   Specimen Description  BLOOD LEFT ANTECUBITAL  Final   Special Requests   Final    BOTTLES DRAWN AEROBIC AND ANAEROBIC Blood Culture adequate volume   Culture   Final    NO GROWTH 5 DAYS Performed at [facility], [address], [place], [place] [address]    Report Status 06/21/2019 FINAL  Final  Respiratory Panel by PCR     Status: None   Collection Time: 06/16/19  9:55 PM   Specimen: Nasopharyngeal Swab; Respiratory  Result Value Ref Range Status   Adenovirus NOT DETECTED NOT DETECTED Final   Coronavirus 229E NOT DETECTED NOT DETECTED Final    Comment: (NOTE) The Coronavirus on the Respiratory Panel, DOES NOT test for the novel  Coronavirus (2019 nCoV)    Coronavirus HKU1 NOT DETECTED NOT DETECTED Final   Coronavirus NL63 NOT DETECTED NOT DETECTED Final   Coronavirus OC43 NOT DETECTED NOT DETECTED Final   Metapneumovirus NOT DETECTED NOT DETECTED Final   Rhinovirus / Enterovirus NOT DETECTED NOT DETECTED Final   Influenza A NOT DETECTED NOT DETECTED Final   Influenza B NOT DETECTED NOT DETECTED Final   Parainfluenza Virus 1 NOT DETECTED NOT DETECTED Final   Parainfluenza Virus 2 NOT DETECTED NOT DETECTED Final   Parainfluenza Virus 3 NOT  DETECTED NOT DETECTED Final   Parainfluenza Virus 4 NOT DETECTED NOT DETECTED Final   Respiratory Syncytial Virus NOT DETECTED NOT DETECTED Final   Bordetella pertussis NOT DETECTED NOT DETECTED Final   Chlamydophila pneumoniae NOT DETECTED NOT DETECTED Final   Mycoplasma pneumoniae NOT DETECTED NOT DETECTED Final    Comment: Performed at [facility], [place] [address], [place], [place] [address]  C difficile quick scan w PCR reflex     Status: None   Collection Time: 06/16/19 10:05 PM   Specimen: STOOL  Result Value Ref Range Status   C Diff antigen NEGATIVE NEGATIVE Final   C Diff toxin NEGATIVE NEGATIVE Final   C Diff interpretation No C. difficile detected.  Final    Comment: Performed at [facility], [place] [address], [place], [place] [address]      Radiology Studies: Vas [place] Lower Extremity Venous (dvt) Result Date: 06/22/2019 Summary: Right: No evidence of deep vein thrombosis in the lower extremity. No indirect evidence of obstruction proximal to the inguinal ligament. There is no evidence of deep vein thrombosis in the lower extremity. No cystic structure found in the popliteal fossa. Left: No evidence of deep vein thrombosis in the lower extremity. No indirect evidence of obstruction proximal to the inguinal ligament. There is no evidence of deep vein thrombosis in the lower extremity. No cystic structure found in the popliteal fossa.  *See table(s) above for measurements and observations. Electronically signed by Deitra Mayo MD on 06/22/2019 at 3:34:05 PM.    Final     Scheduled Meds: . albuterol  2 puff Inhalation Q6H  . cholecalciferol  2,000 Units Oral Daily  . multivitamin with minerals  1 tablet Oral Daily  . pravastatin  80 mg Oral QHS  . rivaroxaban  20 mg Oral QPC supper  . traZODone  100 mg Oral QHS   Continuous Infusions:   LOS: 10 days   Time spent: 25 minutes.  Patrecia Pour, MD [facility] [email or web address] Password [email or web address] 06/26/2019, 9:25  AM

## 2019-06-26 NOTE — Progress Notes (Signed)
Son updated

## 2019-06-27 NOTE — Plan of Care (Addendum)
Continue w POC awaiting SNF approval for pending discharge. This Probation officer spoke w pt's son via telephone regarding updates/questions. No further concerns noted at this time.

## 2019-06-27 NOTE — Progress Notes (Signed)
PROGRESS NOTE  Jason [name]  [id number] DOB: [birth year] DOA: 06/16/2019 PCP: Lujean Amel, MD  Brief Narrative: Jason [name] is a 78 y.o. male with a history of COPD, HTN, recently diagnosed recurrent PE now on xarelto, HLD, and HOH who presented to the ED with 1 week (starting 8/5) of progressive shortness of breath, nonproductive cough, and right-sided pleuritic chest pain. He was hypoxic with positive covid-19 assay, elevated inflammatory markers and bilateral CXR infiltrates. Remdesivir and steroids started 8/12, tocilizumab given 8/15. Hypoxia has continued to improve slowly, though due to deconditioning acutely, SNF rehabilitation is recommended at discharge. The patient has been accepted at [facility], though insurance authorization is delaying discharge.  Assessment & Plan: Principal Problem:   COVID-19 virus infection Active Problems:   Tobacco abuse   Pulmonary embolism (HCC)   Acute respiratory failure with hypoxia (HCC)   Bilateral pneumonia   CAD (coronary artery disease)   Hyperlipidemia  Acute hypoxic respiratory failure due to covid-19 pneumonia:  - Continue supplemental oxygen as needed to maintain SpO2 90%. - Continue PT/OT. - Completed remdesivir x5 days - Completed steroids x10 days on 8/21. Inflammatory markers showed sustained improvement and will no longer be trended. - Given tocilizumab 8/15.   Elevated d-dimer: Improving, no new clots on CTA chest or LE venous doppler.  - Continue xarelto.  - Will not continue trending as this would not change management.   Pulmonary nodules: CTA Chest personally reviewe3d in addition to formal radiology report. I see diffuse GGOs with fibrosis and scarring diffusely with nonocclusive clot. It did not show acute PE, confirmed chronic PE, suggestion of PAH, cicatrization/fibrosis/bronchiectatic change and evidence of prior granulomatous disease. Per radiology report: "There is a stable 4 mm nodular opacity in the medial segment right  lower lobe seen on axial slice 123456 series 6. A 2 mm nodular lesion in this area is seen on axial slice 123456 series 6. There is a 2 mm nodular opacity in the medial aspect of the superior segment of the right lower lobe seen on axial slice 123XX123 series 6...Marland KitchenMultiple small nodular opacities raise concern for potential underlying metastatic foci." - Follow up imaging recommended after resolution of this illness.   Sinus bradycardia: Stable, improving, no pauses or symptoms.  - DC telemetry.  History of recurrent PE, chronic PE with pulmonary hypertension:  - Lifelong anticoagulation with xarelto, reports full compliance and CTA chest does demonstrate resorption of some clots, no new clots.  COPD: No wheezing - Completing 10 days steroids, prn albuterol MDI  Positive FOBT: Without gross bleeding. Hgb stable, no anemia. Had colonoscopy 2015 with polyp and diverticuli.  - Monitor intermittently.  Hyperlipidemia:  - Continue statin  HTN:  - Hold home medications as he's normotensive  Anxiety, depression:  - Continue librium prn, trazodone qHS  LFT elevation: Mildly elevated ALT.  - Recheck at follow up.  DVT prophylaxis: Xarelto Code Status: DNR Family Communication: None at bedside, will contact family if any clinical updates are necessary. Disposition Plan: Medically stable for discharge to [facility] pending insurance authorization.   Consultants:   None  Procedures:   None  Antimicrobials:  Remdesivir   Subjective: No changes, feels short of breath only with exertion, not otherwise. No chest pain. Not getting up much at all.   Objective: Vitals:   06/26/19 1627 06/26/19 2000 06/27/19 0400 06/27/19 0843  BP: 105/61 (!) 103/58 112/72 98/78  Pulse: 76 (!) 55 (!) 43 74  Resp: (!) 118 19 17 18   Temp: 97.8  F (36.6 C) 98.3 F (36.8 C) 97.8 F (36.6 C) 98.7 F (37.1 C)  TempSrc: Oral Oral Oral Oral  SpO2: 90% 93% 93% 91%  Weight:      Height:         Intake/Output Summary (Last 24 hours) at 06/27/2019 1041 Last data filed at 06/27/2019 0900 Gross per 24 hour  Intake 720 ml  Output 600 ml  Net 120 ml   Filed Weights   06/16/19 1233 06/16/19 2120  Weight: 86.2 kg 82.5 kg   Gen: 78 y.o. male in no distress Pulm: Nonlabored breathing supplemental oxygen, crackles L > R diffusely CV: Regular rate and rhythm. No murmur, rub, or gallop. No JVD, no dependent edema. GI: Abdomen soft, non-tender, non-distended, with normoactive bowel sounds.  Ext: Warm, no deformities Skin: No rashes, lesions or ulcers on visualized skin. Neuro: Alert and oriented. No focal neurological deficits. Psych: Judgement and insight appear fair. Mood euthymic & affect congruent. Behavior is appropriate.    Data Reviewed: I have personally reviewed following labs and imaging studies  CBC: Recent Labs  Lab 06/24/19 0235  WBC 9.1  NEUTROABS 6.8  HGB 13.8  HCT 41.0  MCV 100.7*  PLT Q000111Q   Basic Metabolic Panel: Recent Labs  Lab 06/21/19 0300 06/24/19 0235 06/25/19 0110  NA  --  138 139  K  --  4.3 4.1  CL  --  104 110  CO2  --  25 21*  GLUCOSE  --  102* 118*  BUN  --  24* 26*  CREATININE  --  0.87 0.83  CALCIUM  --  8.5* 8.7*  MG 1.9  --   --   PHOS 3.4  --   --    GFR: Estimated Creatinine Clearance: 74.5 mL/min (by C-G formula based on SCr of 0.83 mg/dL). Liver Function Tests: Recent Labs  Lab 06/24/19 0235 06/25/19 0110  AST 36 34  ALT 81* 83*  ALKPHOS 48 46  BILITOT 0.6 0.5  PROT 6.1* 5.8*  ALBUMIN 2.6* 2.5*   No results for input(s): LIPASE, AMYLASE in the last 168 hours. No results for input(s): AMMONIA in the last 168 hours. Coagulation Profile: No results for input(s): INR, PROTIME in the last 168 hours. Cardiac Enzymes: Recent Labs  Lab 06/21/19 0300  CKTOTAL 20*   BNP (last 3 results) No results for input(s): PROBNP in the last 8760 hours. HbA1C: No results for input(s): HGBA1C in the last 72 hours. CBG: No results  for input(s): GLUCAP in the last 168 hours. Lipid Profile: No results for input(s): CHOL, HDL, LDLCALC, TRIG, CHOLHDL, LDLDIRECT in the last 72 hours. Thyroid Function Tests: No results for input(s): TSH, T4TOTAL, FREET4, T3FREE, THYROIDAB in the last 72 hours. Anemia Panel: No results for input(s): VITAMINB12, FOLATE, FERRITIN, TIBC, IRON, RETICCTPCT in the last 72 hours. Urine analysis:    Component Value Date/Time   COLORURINE YELLOW 05/31/2019 2218   APPEARANCEUR CLEAR 05/31/2019 2218   LABSPEC >1.030 (H) 05/31/2019 2218   PHURINE 5.5 05/31/2019 2218   GLUCOSEU NEGATIVE 05/31/2019 2218   HGBUR SMALL (A) 05/31/2019 2218   BILIRUBINUR MODERATE (A) 05/31/2019 2218   KETONESUR 40 (A) 05/31/2019 2218   PROTEINUR 100 (A) 05/31/2019 2218   NITRITE NEGATIVE 05/31/2019 2218   LEUKOCYTESUR NEGATIVE 05/31/2019 2218   No results found for this or any previous visit (from the past 240 hour(s)).    Radiology Studies: Vas [place] Lower Extremity Venous (dvt) Result Date: 06/22/2019 Summary: Right: No evidence of  deep vein thrombosis in the lower extremity. No indirect evidence of obstruction proximal to the inguinal ligament. There is no evidence of deep vein thrombosis in the lower extremity. No cystic structure found in the popliteal fossa. Left: No evidence of deep vein thrombosis in the lower extremity. No indirect evidence of obstruction proximal to the inguinal ligament. There is no evidence of deep vein thrombosis in the lower extremity. No cystic structure found in the popliteal fossa.  *See table(s) above for measurements and observations. Electronically signed by Deitra Mayo MD on 06/22/2019 at 3:34:05 PM.    Final     Scheduled Meds: . albuterol  2 puff Inhalation Q6H  . cholecalciferol  2,000 Units Oral Daily  . multivitamin with minerals  1 tablet Oral Daily  . pravastatin  80 mg Oral QHS  . rivaroxaban  20 mg Oral QPC supper  . traZODone  100 mg Oral QHS   Continuous  Infusions:   LOS: 11 days   Time spent: 25 minutes.  Patrecia Pour, MD [facility] [email or web address] Password [email or web address] 06/27/2019, 10:41 AM

## 2019-06-28 ENCOUNTER — Ambulatory Visit (HOSPITAL_COMMUNITY): Admission: RE | Admit: 2019-06-28 | Payer: Medicare HMO | Source: Ambulatory Visit

## 2019-06-28 MED ORDER — CHLORDIAZEPOXIDE HCL 5 MG PO CAPS: 5.0000 mg | ORAL_CAPSULE | Freq: Three times a day (TID) | ORAL | 0 refills | Status: DC | PRN

## 2019-06-28 NOTE — Discharge Summary (Addendum)
Physician Discharge Summary  Jason [name] [id number] DOB: [birth year] DOA: 06/16/2019  PCP: Lujean Amel, MD  Admit date: 06/16/2019 Discharge date: 06/28/2019  Admitted From: Home Disposition: SNF   Recommendations for Outpatient Follow-up:  1. Follow up with PCP in 1-2 weeks 2. Please obtain CMP/CBC in one week 3. Follow up pulmonary nodules noted on CTA chest during admission, see below for full details.  Home Health: N/A Equipment/Devices: Supplemental oxygen Discharge Condition: Stable CODE STATUS: DNR Diet recommendation: Heart healthy  Brief/Interim Summary: Jason [name] is a 78 y.o. male with a history of COPD, HTN, recently diagnosed recurrent PE now on xarelto, HLD, and HOH who presented to the ED with 1 week (starting 8/5) of progressive shortness of breath, nonproductive cough, and right-sided pleuritic chest pain. He was hypoxic with positive covid-19 assay, elevated inflammatory markers and bilateral CXR infiltrates. Remdesivir and steroids started 8/12, tocilizumab given 8/15. Hypoxia has continued to improve slowly, though due to deconditioning acutely, SNF rehabilitation is recommended at discharge. The patient has been accepted at [facility], though insurance authorization has delayed discharge.  Discharge Diagnoses:  Principal Problem:   COVID-19 virus infection Active Problems:   Tobacco abuse   Pulmonary embolism (HCC)   Acute respiratory failure with hypoxia (HCC)   Bilateral pneumonia   CAD (coronary artery disease)   Hyperlipidemia  Acute hypoxic respiratory failure due to covid-19 pneumonia:  - Continue supplemental oxygen as needed to maintain SpO2 90%. Oxygen requirement stable. With preexisting lung disease, PE, and now viral pneumonia, he may have a protracted recovery and may require oxygen chronically. - Continue PT/OT at SNF. - Completed remdesivir x5 days - Completed steroids x10 days on 8/21. Inflammatory markers showed sustained improvement. - Given  tocilizumab 8/15.   Elevated d-dimer: Improving, no new clots on CTA chest or LE venous doppler.  - Continue xarelto.  Pulmonary nodules: CTA Chest personally reviewe3d in addition to formal radiology report. I see diffuse GGOs with fibrosis and scarring diffusely with nonocclusive clot. It did not show acute PE, confirmed chronic PE, suggestion of PAH, cicatrization/fibrosis/bronchiectatic change and evidence of prior granulomatous disease. Per radiology report: "There is a stable 4 mm nodular opacity in the medial segment right lower lobe seen on axial slice 123456 series 6. A 2 mm nodular lesion in this area is seen on axial slice 123456 series 6. There is a 2 mm nodular opacity in the medial aspect of the superior segment of the right lower lobe seen on axial slice 123XX123 series 6...Marland KitchenMultiple small nodular opacities raise concern for potential underlying metastatic foci." - Follow up imaging recommended after resolution of this illness.   Sinus bradycardia: Stable, improving, no pauses or symptoms.  History of recurrent PE, chronic PE with pulmonary hypertension:  - Lifelong anticoagulation with xarelto, reports full compliance and CTA chest does demonstrate resorption of some clots, no new clots.  COPD: No wheezing - Completed 10 days steroids, prn albuterol MDI  Positive FOBT: Without gross bleeding. Hgb stable, no anemia. Had colonoscopy 2015 with polyp and diverticuli.  - Monitor intermittently.  Hyperlipidemia:  - Continue statin  HTN:  - Restart home medications  Anxiety, depression:  - Continue librium prn, though he has not required this in the past 10 days, trazodone qHS  LFT elevation: Mildly elevated ALT.  - Recheck at follow up.  Discharge Instructions Discharge Instructions    Diet - low sodium heart healthy   Complete by: As directed    Increase activity slowly   Complete by: As  directed      Allergies as of 06/28/2019   No Known Allergies      Medication List    TAKE these medications   aspirin 81 MG chewable tablet Chew by mouth daily.   chlordiazePOXIDE 5 MG capsule Commonly known as: LIBRIUM Take 1 capsule (5 mg total) by mouth 3 (three) times daily as needed for anxiety (or tremors).   JOINT FORMULA PO Take 1 tablet by mouth daily.   lisinopril-hydrochlorothiazide 20-25 MG tablet Commonly known as: ZESTORETIC Take 1 tablet by mouth daily.   multivitamin with minerals tablet Take 1 tablet by mouth daily.   potassium chloride SA 20 MEQ tablet Commonly known as: K-DUR Take 2 tablets (40 mEq total) by mouth daily.   pravastatin 80 MG tablet Commonly known as: PRAVACHOL Take 80 mg by mouth daily.   rivaroxaban 20 MG Tabs tablet Commonly known as: XARELTO Take 20 mg by mouth daily.   Spiriva HandiHaler 18 MCG inhalation capsule Generic drug: tiotropium Place 1 capsule into inhaler and inhale daily.   traZODone 100 MG tablet Commonly known as: DESYREL Take 100 mg by mouth at bedtime.   Vitamin D (Cholecalciferol) 50 MCG (2000 UT) Caps Take 2,000 Units by mouth daily.      Follow-up Information    Koirala, Dibas, MD Follow up.   Specialty: Family Medicine Contact information: [address] [place] [address] [phone]          No Known Allergies  Consultations:  None  Procedures/Studies: Dg Chest 2 View  Result Date: 05/31/2019 CLINICAL DATA:  Chills and shaking, fatigue EXAM: CHEST - 2 VIEW COMPARISON:  April 12, 2019 FINDINGS: No large airspace consolidation. No pleural effusion. The cardiomediastinal silhouette is unremarkable. There is atherosclerotic calcification the aortic knob with a tortuous descending aorta. Degenerative changes seen in the midthoracic spine. No acute osseous findings. IMPRESSION: No acute cardiopulmonary process. Electronically Signed   By: Prudencio Pair M.D.   On: 05/31/2019 23:53   Ct Angio Chest Pe W Or Wo Contrast  Result Date:  06/24/2019 CLINICAL DATA:  Shortness of breath. History of pulmonary embolus. COVID-19 positive. EXAM: CT ANGIOGRAPHY CHEST WITH CONTRAST TECHNIQUE: Multidetector CT imaging of the chest was performed using the standard protocol during bolus administration of intravenous contrast. Multiplanar CT image reconstructions and MIPs were obtained to evaluate the vascular anatomy. CONTRAST:  1101mL OMNIPAQUE IOHEXOL 350 MG/ML SOLN COMPARISON:  April 12, 2019 CT angiogram chest; chest radiograph June 16, 2019 Marcie Bal FINDINGS: Cardiovascular: There is retraction in the right lower lobe pulmonary artery with areas of residual thrombus, consistent with a degree of chronic pulmonary embolus in the right lower lobe pulmonary artery region. There remains a small amount of residual pulmonary embolus in branches of the left lower lobe pulmonary artery proximally, incompletely obstructing. There is mild retraction in these areas as well suggesting chronic pulmonary embolus change. A small web is noted in a left upper lobe pulmonary artery branch consistent with chronic pulmonary embolus change. No new areas of pulmonary embolus. Multiple areas show resolution of pulmonary embolus. There is no thoracic aortic aneurysm or dissection. There is calcification in multiple proximal sites in the great vessels. There is aortic atherosclerosis as well as foci of coronary artery calcification. There is no pericardial effusion or pericardial thickening. There is prominence of the main pulmonary outflow tract measuring 3.2 cm. Mediastinum/Nodes: Thyroid appears unremarkable. There are scattered subcentimeter mediastinal lymph nodes. There is no adenopathy by size criteria. There is  a stable lymph node in the right hilum with a short axis diameter of 8 mm, toward the upper limits of normal. Several calcified lymph nodes are noted, primarily in the left hilar region, consistent with prior granulomatous disease. Lungs/Pleura: There are foci of  ground-glass type opacity in the posterior segment of the left upper lobe. There are areas of fibrosis in the right upper lobe with patchy areas of ground-glass opacity. Patchy ground-glass opacity is also noted throughout portions of the lingula, right middle lobe, and both lower lobes. There is a degree of fibrosis in the lung bases and cicatrization associated with the ground-glass appearance. There are areas of lower lobe bronchiectatic change bilaterally. There is a minimal right pleural effusion. There is a stable 4 mm nodular opacity in the medial segment right lower lobe seen on axial slice 123456 series 6. A 2 mm nodular lesion in this area is seen on axial slice 123456 series 6. There is a 2 mm nodular opacity in the medial aspect of the superior segment of the right lower lobe seen on axial slice 123XX123 series 6. There is a calcified granuloma in the left lower lobe. Upper Abdomen: In the visualized upper abdomen there is aortic atherosclerosis. Visualized upper abdominal structures otherwise appear unremarkable. Musculoskeletal: There are compression fractures at T6 and to a lesser extent at T5 and T11. There is a sclerotic lesion at T12, stable. Smaller sclerotic lesions are noted at T11 and T6. No chest wall lesions are evident. Review of the MIP images confirms the above findings. IMPRESSION: 1. Findings which are felt to be indicative of chronic pulmonary embolus with areas of retraction and web like appearance in pulmonary artery branches. No acute appearing pulmonary embolus evident. 2. Multiple foci of ground-glass type appearance consistent with atypical organism pneumonia. Areas of cicatrization and fibrosis noted with mild lower lobe bronchiectatic change. Multiple small nodular opacities raise concern for potential underlying metastatic foci. 3. Enlargement of the main pulmonary outflow tract, indicative of pulmonary arterial hypertension. 4. Aortic atherosclerosis. Foci of great vessel and coronary  artery calcification noted. 5.  No appreciable adenopathy. 6. Stable vertebral body compression fractures, most notably at T6. Sclerotic bone lesions, most notably at T12, raise concern for potential underlying neoplastic foci. 7.  Evidence of prior granulomatous disease. Aortic Atherosclerosis (ICD10-I70.0). Electronically Signed   By: Lowella Grip III M.D.   On: 06/24/2019 12:35   Dg Chest Portable 1 View  Result Date: 06/16/2019 CLINICAL DATA:  Shortness of breath and hypoxia EXAM: PORTABLE CHEST 1 VIEW COMPARISON:  May 31, 2019 FINDINGS: The mediastinal contour is normal. Heart size is enlarged. Patchy consolidation is identified in the left lung base and throughout the right lung. There is no pleural effusion or pulmonary edema. No acute abnormalities identified in the visualized bony structures. IMPRESSION: Bilateral pneumonias. Electronically Signed   By: Abelardo Diesel M.D.   On: 06/16/2019 13:51   Vas [place] Lower Extremity Venous (dvt)  Result Date: 06/22/2019  Lower Venous Study Indications: Swelling, and Edema. Other Indications: Covid-19. Anticoagulation: Xarelto. Performing Technologist: Darlina Sicilian RDCS  Examination Guidelines: A complete evaluation includes B-mode imaging, spectral Doppler, color Doppler, and power Doppler as needed of all accessible portions of each vessel. Bilateral testing is considered an integral part of a complete examination. Limited examinations for reoccurring indications may be performed as noted.  +---------+---------------+---------+-----------+----------+-------+ RIGHT    CompressibilityPhasicitySpontaneityPropertiesSummary +---------+---------------+---------+-----------+----------+-------+ CFV      Full           Yes  Yes                          +---------+---------------+---------+-----------+----------+-------+ SFJ      Full                                                  +---------+---------------+---------+-----------+----------+-------+ FV Prox  Full                                                 +---------+---------------+---------+-----------+----------+-------+ FV Mid   Full                                                 +---------+---------------+---------+-----------+----------+-------+ FV DistalFull                                                 +---------+---------------+---------+-----------+----------+-------+ POP      Full           Yes      Yes                          +---------+---------------+---------+-----------+----------+-------+ PTV      Full                                                 +---------+---------------+---------+-----------+----------+-------+ PERO     Full                                                 +---------+---------------+---------+-----------+----------+-------+ Gastroc  Full                                                 +---------+---------------+---------+-----------+----------+-------+   +---------+---------------+---------+-----------+----------+-------+ LEFT     CompressibilityPhasicitySpontaneityPropertiesSummary +---------+---------------+---------+-----------+----------+-------+ CFV      Full           Yes      Yes                          +---------+---------------+---------+-----------+----------+-------+ SFJ      Full                                                 +---------+---------------+---------+-----------+----------+-------+ FV Prox  Full                                                 +---------+---------------+---------+-----------+----------+-------+  FV Mid   Full                                                 +---------+---------------+---------+-----------+----------+-------+ FV DistalFull                                                 +---------+---------------+---------+-----------+----------+-------+ POP      Full            Yes      Yes                          +---------+---------------+---------+-----------+----------+-------+ PTV      Full                                                 +---------+---------------+---------+-----------+----------+-------+ PERO     Full                                                 +---------+---------------+---------+-----------+----------+-------+ Gastroc  Full                                                 +---------+---------------+---------+-----------+----------+-------+     Summary: Right: No evidence of deep vein thrombosis in the lower extremity. No indirect evidence of obstruction proximal to the inguinal ligament. There is no evidence of deep vein thrombosis in the lower extremity. No cystic structure found in the popliteal fossa. Left: No evidence of deep vein thrombosis in the lower extremity. No indirect evidence of obstruction proximal to the inguinal ligament. There is no evidence of deep vein thrombosis in the lower extremity. No cystic structure found in the popliteal fossa.  *See table(s) above for measurements and observations. Electronically signed by Deitra Mayo MD on 06/22/2019 at 3:34:05 PM.    Final     Subjective: Feels stable, breathing not back to baseline but has improved steadily since admission. Still very short of breath with exertion. No chest pain.   Discharge Exam: Vitals:   06/27/19 2100 06/28/19 0400  BP: (!) 97/57 102/62  Pulse: (!) 57 (!) 48  Resp: (!) 22 19  Temp: 97.6 F (36.4 C) 97.8 F (36.6 C)  SpO2: 94% 94%   General: Pt is alert, awake, not in acute distress Cardiovascular: RRR, S1/S2 +, no rubs, no gallops Respiratory: Nonlabored, 90% on 2L at rest, crackles bilaterally which are stable. Abdominal: Soft, NT, ND, bowel sounds + Extremities: No edema, no cyanosis  Labs: BNP (last 3 results) Recent Labs    04/12/19 0915 06/16/19 1631  BNP 326.6* [id number]   Basic Metabolic Panel: Recent  Labs  Lab 06/24/19 0235 06/25/19 0110  NA 138 139  K 4.3 4.1  CL 104 110  CO2 25 21*  GLUCOSE 102* 118*  BUN 24* 26*  CREATININE  0.87 0.83  CALCIUM 8.5* 8.7*   Liver Function Tests: Recent Labs  Lab 06/24/19 0235 06/25/19 0110  AST 36 34  ALT 81* 83*  ALKPHOS 48 46  BILITOT 0.6 0.5  PROT 6.1* 5.8*  ALBUMIN 2.6* 2.5*   CBC: Recent Labs  Lab 06/24/19 0235  WBC 9.1  NEUTROABS 6.8  HGB 13.8  HCT 41.0  MCV 100.7*  PLT 309   Time coordinating discharge: Approximately 40 minutes  Patrecia Pour, MD  [facility] 06/28/2019, 7:05 AM

## 2019-06-28 NOTE — Progress Notes (Addendum)
Physical Therapy Treatment Patient Details Name: Jason [name] MRN: [id number] DOB: 05-07-1941 Today's Date: 06/28/2019    History of Present Illness Jason [name] is an 78 y.o. male past medical history of COPD hypertension recent PE diagnosed June 2020 on Xarelto, hard of hearing presented 8/12 with worsening shortness of breath for 1 week.  To the hospital complaining of right-sided pleuritic chest pain and a nonproductive cough was found to have a chest x-ray showing bilateral infiltrates requiring 5 L of oxygen.  He SARS-CoV-2 PCR was positive    PT Comments    The patient ambulated on 4 L. spo2 dropped to 785 via probe on finger. Placed nelcor forehead after return, appeared actually lower than finger. Will monitor on forehead and increase O2 next visit. Pt will benenfit from rehab to increase functional capacity to return ho,me independently.  Follow Up Recommendations  SNF     Equipment Recommendations  None recommended by PT    Recommendations for Other Services       Precautions / Restrictions Precautions Precaution Comments: O2 probe placed on forehead, use d NELCOR also w/ PT. needs > 4 L to amb most likely    Mobility  Bed Mobility Overal bed mobility: Independent                Transfers   Equipment used: None             General transfer comment: Supervision for safety  Ambulation/Gait Ambulation/Gait assistance: Supervision Gait Distance (Feet): 200 Feet Assistive device: Rolling walker (2 wheeled) Gait Pattern/deviations: Step-through pattern     General Gait Details: pt. on 4 L HFNC, after amb, spo2 78%. gradual incr to 88%- 4 mins. probe on finger   Stairs             Wheelchair Mobility    Modified Rankin (Stroke Patients Only)       Balance                                            Cognition Arousal/Alertness: Awake/alert                                     General Comments: very HOH.  Following commands. agreeable to therapy. enjoys talking about his dog, Indie.       Exercises      General Comments        Pertinent Vitals/Pain Pain Assessment: No/denies pain    Home Living                      Prior Function            PT Goals (current goals can now be found in the care plan section) Progress towards PT goals: Progressing toward goals    Frequency    Min 2X/week      PT Plan Current plan remains appropriate;Frequency needs to be updated    Co-evaluation              AM-PAC PT "6 Clicks" Mobility   Outcome Measure  Help needed turning from your back to your side while in a flat bed without using bedrails?: None Help needed moving from lying on your back to sitting on the side of a flat bed without using bedrails?: None  Help needed moving to and from a bed to a chair (including a wheelchair)?: None Help needed standing up from a chair using your arms (e.g., wheelchair or bedside chair)?: None Help needed to walk in hospital room?: A Little Help needed climbing 3-5 steps with a railing? : A Little 6 Click Score: 22    End of Session Equipment Utilized During Treatment: Oxygen Activity Tolerance: Patient limited by fatigue(SOB) Patient left: in bed;with call bell/phone within reach Nurse Communication: Mobility status PT Visit Diagnosis: Difficulty in walking, not elsewhere classified (R26.2)     Time: [id number] PT Time Calculation (min) (ACUTE ONLY): 22 min  Charges:  $Gait Training: 8-22 mins                     Alva Pager [phone] Office [phone]    Claretha Cooper 06/28/2019, 5:09 PM

## 2019-06-29 DIAGNOSIS — J984 Other disorders of lung: Secondary | ICD-10-CM | POA: Diagnosis not present

## 2019-06-29 DIAGNOSIS — U071 COVID-19: Secondary | ICD-10-CM | POA: Diagnosis not present

## 2019-06-29 DIAGNOSIS — R278 Other lack of coordination: Secondary | ICD-10-CM | POA: Diagnosis not present

## 2019-06-29 DIAGNOSIS — M2550 Pain in unspecified joint: Secondary | ICD-10-CM | POA: Diagnosis not present

## 2019-06-29 DIAGNOSIS — R2689 Other abnormalities of gait and mobility: Secondary | ICD-10-CM | POA: Diagnosis not present

## 2019-06-29 DIAGNOSIS — R918 Other nonspecific abnormal finding of lung field: Secondary | ICD-10-CM | POA: Diagnosis not present

## 2019-06-29 DIAGNOSIS — Z7401 Bed confinement status: Secondary | ICD-10-CM | POA: Diagnosis not present

## 2019-06-29 DIAGNOSIS — M6281 Muscle weakness (generalized): Secondary | ICD-10-CM | POA: Diagnosis not present

## 2019-06-29 DIAGNOSIS — J449 Chronic obstructive pulmonary disease, unspecified: Secondary | ICD-10-CM | POA: Diagnosis not present

## 2019-06-29 DIAGNOSIS — Z720 Tobacco use: Secondary | ICD-10-CM | POA: Diagnosis not present

## 2019-06-29 DIAGNOSIS — I2699 Other pulmonary embolism without acute cor pulmonale: Secondary | ICD-10-CM | POA: Diagnosis not present

## 2019-06-29 DIAGNOSIS — R5381 Other malaise: Secondary | ICD-10-CM | POA: Diagnosis not present

## 2019-06-29 DIAGNOSIS — F39 Unspecified mood [affective] disorder: Secondary | ICD-10-CM | POA: Diagnosis not present

## 2019-06-29 DIAGNOSIS — E785 Hyperlipidemia, unspecified: Secondary | ICD-10-CM | POA: Diagnosis not present

## 2019-06-29 DIAGNOSIS — R498 Other voice and resonance disorders: Secondary | ICD-10-CM | POA: Diagnosis not present

## 2019-06-29 DIAGNOSIS — J9601 Acute respiratory failure with hypoxia: Secondary | ICD-10-CM | POA: Diagnosis not present

## 2019-06-29 DIAGNOSIS — I2721 Secondary pulmonary arterial hypertension: Secondary | ICD-10-CM | POA: Diagnosis not present

## 2019-06-29 DIAGNOSIS — I10 Essential (primary) hypertension: Secondary | ICD-10-CM | POA: Diagnosis not present

## 2019-06-29 DIAGNOSIS — R2681 Unsteadiness on feet: Secondary | ICD-10-CM | POA: Diagnosis not present

## 2019-06-29 DIAGNOSIS — E7849 Other hyperlipidemia: Secondary | ICD-10-CM | POA: Diagnosis not present

## 2019-06-29 DIAGNOSIS — R0602 Shortness of breath: Secondary | ICD-10-CM | POA: Diagnosis not present

## 2019-06-29 DIAGNOSIS — J1289 Other viral pneumonia: Secondary | ICD-10-CM | POA: Diagnosis not present

## 2019-06-29 NOTE — Consult Note (Signed)
   [facility] CM Inpatient Consult   06/29/2019  Kyvin Cauldwell [birth year] [id number]    Patient'schart has been reviewed for 2 hospitalizations and 1 ED visit in the past 6 months, with 14% risk score for unplanned readmissions and hospitalization under his [facility] plan; and to check for [place] Managementfollow up needs.  Per MD's brief summary on 06/28/19, reveal as follows: Grafton Folk a 78 y.o.malewith a history of COPD, HTN, recently diagnosed recurrent PE now on xarelto, HLD, and HOH,  who presented to the ED with 1 week (starting 8/5) of progressive shortness of breath, nonproductive cough, and right-sided pleuritic chest pain. He was hypoxic with positive covid-19 assay, elevated inflammatory markers and bilateral CXR infiltrates. Remdesivir and steroids started 8/12, tocilizumab given 8/15. Hypoxia has continued to improve slowly, though due to deconditioning acutely, SNF rehabilitation is recommended at discharge.  Chart review and transition of care team notes show that current disposition is for skilled nursing facility (SNF) as per PT/ OT recommendation. Patient set to discharge to [facility] for today.  No [facility] care management needs at this point.   For questions and additional information, please call:  Sanika Brosious A. Roscoe Witts, BSN, RN-BC [facility] Liaison Cell: [phone]

## 2019-06-29 NOTE — Progress Notes (Signed)
Patient was discharged on 06/28/2019 pending insurance authorization for SNF. Insurance requested updated PT evaluation which took place and the insurance has now authorized SNF disposition. Today the patient feels unchanged, no new shortness of breath or chest pain or other symptoms. Vital signs remain stable. He remains in no distress, respirations remain nonlabored with supplemental oxygen, crackles bilaterally are unchanged, and HR remains borderline bradycardic. There are no changes to the plan as dictated in discharge summary 06/28/2019.   Vance Gather, MD 06/29/2019 10:31 AM

## 2019-06-29 NOTE — Progress Notes (Signed)
Report called to caregiver at [facility]. IVA discontinued, patient assisted to stretcher and transferred with EMS in stable condition.

## 2019-06-29 NOTE — TOC Transition Note (Signed)
Transition of Care [facility]) - CM/SW Discharge Note   Patient Details  Name: Jason [name] MRN: [id number] Date of Birth: May 28, 1941  [facility] CM/SW Contact:  Weston Anna, LCSW Phone Number: 06/29/2019, 10:25 AM   Clinical Narrative:     Patient set to discharge to [facility] for today- [facility] has been called for transportation. MD and RN notified. Please call report to [phone].   CSW notified patients son Jason [name] of discharge.     Barriers to Discharge: Continued Medical Work up   Patient Goals and CMS Choice   CMS Medicare.gov Compare Post Acute Care list provided to:: Patient Represenative (must comment)(son Jason [name]) Choice offered to / list presented to : Adult Children  Discharge Placement                       Discharge Plan and Services     Post Acute Care Choice: Skilled Nursing Facility                               Social Determinants of Health (SDOH) Interventions     Readmission Risk Interventions No flowsheet data found.

## 2019-06-30 ENCOUNTER — Other Ambulatory Visit (HOSPITAL_COMMUNITY): Payer: Medicare HMO

## 2019-07-02 DIAGNOSIS — I2699 Other pulmonary embolism without acute cor pulmonale: Secondary | ICD-10-CM | POA: Diagnosis not present

## 2019-07-02 DIAGNOSIS — J984 Other disorders of lung: Secondary | ICD-10-CM | POA: Diagnosis not present

## 2019-07-02 DIAGNOSIS — J1289 Other viral pneumonia: Secondary | ICD-10-CM | POA: Diagnosis not present

## 2019-07-02 DIAGNOSIS — J9601 Acute respiratory failure with hypoxia: Secondary | ICD-10-CM | POA: Diagnosis not present

## 2019-07-05 DIAGNOSIS — J9601 Acute respiratory failure with hypoxia: Secondary | ICD-10-CM | POA: Diagnosis not present

## 2019-07-05 DIAGNOSIS — J1289 Other viral pneumonia: Secondary | ICD-10-CM | POA: Diagnosis not present

## 2019-07-05 DIAGNOSIS — U071 COVID-19: Secondary | ICD-10-CM | POA: Diagnosis not present

## 2019-07-06 DIAGNOSIS — R498 Other voice and resonance disorders: Secondary | ICD-10-CM | POA: Diagnosis not present

## 2019-07-06 DIAGNOSIS — U071 COVID-19: Secondary | ICD-10-CM | POA: Diagnosis not present

## 2019-07-06 DIAGNOSIS — J984 Other disorders of lung: Secondary | ICD-10-CM | POA: Diagnosis not present

## 2019-07-06 DIAGNOSIS — R2689 Other abnormalities of gait and mobility: Secondary | ICD-10-CM | POA: Diagnosis not present

## 2019-07-06 DIAGNOSIS — M6281 Muscle weakness (generalized): Secondary | ICD-10-CM | POA: Diagnosis not present

## 2019-07-06 DIAGNOSIS — I2699 Other pulmonary embolism without acute cor pulmonale: Secondary | ICD-10-CM | POA: Diagnosis not present

## 2019-07-06 DIAGNOSIS — J9601 Acute respiratory failure with hypoxia: Secondary | ICD-10-CM | POA: Diagnosis not present

## 2019-07-06 DIAGNOSIS — I10 Essential (primary) hypertension: Secondary | ICD-10-CM | POA: Diagnosis not present

## 2019-07-06 DIAGNOSIS — J449 Chronic obstructive pulmonary disease, unspecified: Secondary | ICD-10-CM | POA: Diagnosis not present

## 2019-07-06 DIAGNOSIS — J1289 Other viral pneumonia: Secondary | ICD-10-CM | POA: Diagnosis not present

## 2019-07-06 DIAGNOSIS — R278 Other lack of coordination: Secondary | ICD-10-CM | POA: Diagnosis not present

## 2019-07-06 DIAGNOSIS — J439 Emphysema, unspecified: Secondary | ICD-10-CM | POA: Diagnosis not present

## 2019-07-06 DIAGNOSIS — R2681 Unsteadiness on feet: Secondary | ICD-10-CM | POA: Diagnosis not present

## 2019-07-06 DIAGNOSIS — J9611 Chronic respiratory failure with hypoxia: Secondary | ICD-10-CM | POA: Diagnosis not present

## 2019-07-07 DIAGNOSIS — J9601 Acute respiratory failure with hypoxia: Secondary | ICD-10-CM | POA: Diagnosis not present

## 2019-07-07 DIAGNOSIS — U071 COVID-19: Secondary | ICD-10-CM | POA: Diagnosis not present

## 2019-07-07 DIAGNOSIS — I2699 Other pulmonary embolism without acute cor pulmonale: Secondary | ICD-10-CM | POA: Diagnosis not present

## 2019-07-07 DIAGNOSIS — J984 Other disorders of lung: Secondary | ICD-10-CM | POA: Diagnosis not present

## 2019-07-07 DIAGNOSIS — I10 Essential (primary) hypertension: Secondary | ICD-10-CM | POA: Diagnosis not present

## 2019-07-09 DIAGNOSIS — U071 COVID-19: Secondary | ICD-10-CM | POA: Diagnosis not present

## 2019-07-09 DIAGNOSIS — J1289 Other viral pneumonia: Secondary | ICD-10-CM | POA: Diagnosis not present

## 2019-07-09 DIAGNOSIS — I10 Essential (primary) hypertension: Secondary | ICD-10-CM | POA: Diagnosis not present

## 2019-07-09 DIAGNOSIS — J9601 Acute respiratory failure with hypoxia: Secondary | ICD-10-CM | POA: Diagnosis not present

## 2019-07-13 DIAGNOSIS — U071 COVID-19: Secondary | ICD-10-CM | POA: Diagnosis not present

## 2019-07-13 DIAGNOSIS — J9611 Chronic respiratory failure with hypoxia: Secondary | ICD-10-CM | POA: Diagnosis not present

## 2019-07-13 DIAGNOSIS — J439 Emphysema, unspecified: Secondary | ICD-10-CM | POA: Diagnosis not present

## 2019-07-16 ENCOUNTER — Encounter (HOSPITAL_COMMUNITY): Payer: Self-pay | Admitting: Pulmonary Disease

## 2019-07-16 DIAGNOSIS — J439 Emphysema, unspecified: Secondary | ICD-10-CM | POA: Diagnosis not present

## 2019-07-16 DIAGNOSIS — U071 COVID-19: Secondary | ICD-10-CM | POA: Diagnosis not present

## 2019-07-16 DIAGNOSIS — J9611 Chronic respiratory failure with hypoxia: Secondary | ICD-10-CM | POA: Diagnosis not present

## 2019-07-20 DIAGNOSIS — J9611 Chronic respiratory failure with hypoxia: Secondary | ICD-10-CM | POA: Diagnosis not present

## 2019-07-20 DIAGNOSIS — U071 COVID-19: Secondary | ICD-10-CM | POA: Diagnosis not present

## 2019-07-20 DIAGNOSIS — J439 Emphysema, unspecified: Secondary | ICD-10-CM | POA: Diagnosis not present

## 2019-07-22 DIAGNOSIS — J9611 Chronic respiratory failure with hypoxia: Secondary | ICD-10-CM | POA: Diagnosis not present

## 2019-07-22 DIAGNOSIS — U071 COVID-19: Secondary | ICD-10-CM | POA: Diagnosis not present

## 2019-07-22 DIAGNOSIS — J439 Emphysema, unspecified: Secondary | ICD-10-CM | POA: Diagnosis not present

## 2019-07-29 DIAGNOSIS — U071 COVID-19: Secondary | ICD-10-CM | POA: Diagnosis not present

## 2019-07-29 DIAGNOSIS — J9611 Chronic respiratory failure with hypoxia: Secondary | ICD-10-CM | POA: Diagnosis not present

## 2019-07-29 DIAGNOSIS — I10 Essential (primary) hypertension: Secondary | ICD-10-CM | POA: Diagnosis not present

## 2019-08-02 ENCOUNTER — Telehealth: Payer: Self-pay | Admitting: Pulmonary Disease

## 2019-08-12 DIAGNOSIS — Z23 Encounter for immunization: Secondary | ICD-10-CM | POA: Diagnosis not present

## 2019-08-12 DIAGNOSIS — J9611 Chronic respiratory failure with hypoxia: Secondary | ICD-10-CM | POA: Diagnosis not present

## 2019-08-12 DIAGNOSIS — I10 Essential (primary) hypertension: Secondary | ICD-10-CM | POA: Diagnosis not present

## 2019-08-12 DIAGNOSIS — J439 Emphysema, unspecified: Secondary | ICD-10-CM | POA: Diagnosis not present

## 2019-08-12 DIAGNOSIS — Z86711 Personal history of pulmonary embolism: Secondary | ICD-10-CM | POA: Diagnosis not present

## 2019-08-12 DIAGNOSIS — J449 Chronic obstructive pulmonary disease, unspecified: Secondary | ICD-10-CM | POA: Diagnosis not present

## 2019-08-16 ENCOUNTER — Other Ambulatory Visit: Payer: Self-pay

## 2019-08-16 ENCOUNTER — Encounter: Payer: Self-pay | Admitting: Primary Care

## 2019-08-16 ENCOUNTER — Ambulatory Visit (INDEPENDENT_AMBULATORY_CARE_PROVIDER_SITE_OTHER): Payer: Medicare HMO | Admitting: Primary Care

## 2019-08-16 VITALS — BP 112/60 | HR 74 | Ht 70.0 in | Wt 185.8 lb

## 2019-08-16 DIAGNOSIS — J9621 Acute and chronic respiratory failure with hypoxia: Secondary | ICD-10-CM

## 2019-08-16 DIAGNOSIS — R911 Solitary pulmonary nodule: Secondary | ICD-10-CM

## 2019-08-16 DIAGNOSIS — U071 COVID-19: Secondary | ICD-10-CM | POA: Diagnosis not present

## 2019-08-16 DIAGNOSIS — J441 Chronic obstructive pulmonary disease with (acute) exacerbation: Secondary | ICD-10-CM | POA: Diagnosis not present

## 2019-08-16 MED ORDER — SPIRIVA HANDIHALER 18 MCG IN CAPS: 1.0000 | ORAL_CAPSULE | Freq: Every day | RESPIRATORY_TRACT | 0 refills | Status: DC

## 2019-08-16 NOTE — Assessment & Plan Note (Signed)
-   Former smoker, suspected COPD - Needs PFTs - Continue Spiriva HandiHaler (Spiriva respimat 2.5 sample given) - Refer to [facility] for medication management d/t cost

## 2019-08-16 NOTE — Progress Notes (Signed)
@Patient  ID: Jason [name], male    DOB: [birth year], 78 y.o.   MRN: [id number]  Chief Complaint  Patient presents with  . Follow-up    sob better,no cough,uses oxygen with exertion as needed 2L    Referring provider: Lujean Amel, MD   Synopsis: Consult July 2020 for PE, SOB and lingular lung nodule. Moved from [place] in 2019. Weaned off Xarelto and started having shortness of breath. This was his second blood clot, now on lifetime Xarelto. Chest images within a rather short period of time show a decrease in size from 1.7 cm to 1.5 cm lingular lung nodule. Evaluation with PET imaging will help identify whether or not it is at high risk for malignancy.  He does have high risk history due to longstanding tobacco abuse. He would need anticoagulation for his submassive PE before considering a tissue sample.  HPI: 78 year old male, former smoker. PMH significant for COPD, respiratory failure with hypoxia, bilateral pneumonia, submassive pulmonary emboli, Lung nodule, CAD, COVID-19. Patient of Dr. Valeta Harms, seen for initial consult on 05/05/19. Plan repeat PET and needs PFTs. Maintained on Spiriva.   Previous [facility] pulmonary encounters: This is a 78 year old gentleman with a past medical history of COPD, pulmonary nodules, history of PE he was treated with Xarelto in the past up until August of this past year which was taken off by his primary care provider.  His mother has a history of blood clots at the age of 12 after having a stroke during that hospitalization.  He was admitted to the hospital on 04/12/2019 to the hospitalist service.  Patient was found to have acute hypoxemic respiratory failure secondary to submassive PE.  CT imaging revealed evidence of right ventricular strain.,  Troponin was 0.05.  COVID negative.  This is now his second pulmonary embolus and was explained during his hospitalization that he would need lifelong anticoagulation.  Patient was referred to pulmonary following his  hospitalization for evaluation of a 1.5 cm pulmonary nodule as well as recurrent pulmonary embolus.  He is a former smoker who quit in April 2020.  Of note he does have regular alcohol use as well. Quit smoking cigarettes in 10 + years ago. He smoked most of his life since a teenager. He had a PFT or spirometry in the office in [place] but he doesn't know the results.   Today, he has been doing well.  He has no additional complaints.  He does admit to some dyspnea on exertion and excess fatigue when he goes to the driving range to hit a bucket of golf balls.  He does feel like this is a little bit better since his hospitalization.  Hospitalized 06/16/19- 06/28/19 for pneumonia: Patient presented to ED with progressive shortness of breath, nonproductive cough and right sided pleuritic chest pain. He was hypoxic, inflammatory markers elevated and Covid-19 positive, CXR showed bilateral infiltrates. Completed Remdesivir x5 days and steroids x 10 days. Tocilizumab given 8/15. Hypoxia continued to improve, patient discharged to SNF d/t deconditioning.   08/16/2019 Patient presents today for hospital follow-up/hypoxia. Patient is doing well. States that his shortness of breath and fatigue are much better since positive for COVID. No cough. Only time he notices shortness of breath when he is out walking the dog, also depends on how fast he is walking. Remains on 2L oxygen, does not always wear it at home. Wears his oxygen when out walking or grocery shopping. He has a pulse oximeter which allows him to check his oxygen  levels. States that his O2 level on room air at rest runs in the 90s. Takes Spiriva only as needed, states that he cant see much of a difference with medication and it is expensive. States that he doesn't know how much it cost. He is on a blood thinner which is also expensive when he is in the donut hole. Denies fever, cough, wheezing, loss of smell/taste.   Imaging: 06/24/19 CTA - diffuse  ground-glass opacities with fibrosis and scarring diffusely. No acute PE, confirmed chronic PE. Suggestion of PAH, evidence of prior granulomatous disease.  Stable 29mm nodules opacity in the medial segment right lower lobe. ,Multiple small nodular opacities raise concern for potential underlying metastatic foci. Follow-up recommended after resolution of illness.   No Known Allergies  Immunization History  Administered Date(s) Administered  . Influenza Split 08/12/2019  . Influenza, High Dose Seasonal PF 07/28/2015  . Influenza,inj,Quad PF,6+ Mos 09/12/2014  . Pneumococcal Conjugate-13 08/22/2015  . Pneumococcal Polysaccharide-23 03/02/2012  . Tdap 11/27/2010    Past Medical History:  Diagnosis Date  . COPD (chronic obstructive pulmonary disease) ([place])   . Hypertension   . Pulmonary embolism (HCC)     Tobacco History: Social History   Tobacco Use  Smoking Status Former Smoker  Smokeless Tobacco Never Used   Counseling given: Not Answered   Outpatient Medications Prior to Visit  Medication Sig Dispense Refill  . aspirin 81 MG chewable tablet Chew by mouth daily.    Marland Kitchen lisinopril-hydrochlorothiazide (ZESTORETIC) 20-25 MG tablet Take 1 tablet by mouth daily.    . Multiple Vitamins-Minerals (MULTIVITAMIN WITH MINERALS) tablet Take 1 tablet by mouth daily.    . Nutritional Supplements (JOINT FORMULA PO) Take 1 tablet by mouth daily.    . potassium chloride SA (K-DUR) 20 MEQ tablet Take 2 tablets (40 mEq total) by mouth daily. 7 tablet 0  . pravastatin (PRAVACHOL) 80 MG tablet Take 80 mg by mouth daily.    . rivaroxaban (XARELTO) 20 MG TABS tablet Take 20 mg by mouth daily.    . traZODone (DESYREL) 100 MG tablet Take 100 mg by mouth at bedtime.    . Vitamin D, Cholecalciferol, 50 MCG (2000 UT) CAPS Take 2,000 Units by mouth daily.    Marland Kitchen SPIRIVA HANDIHALER 18 MCG inhalation capsule Place 1 capsule into inhaler and inhale daily.    . chlordiazePOXIDE (LIBRIUM) 5 MG capsule Take 1  capsule (5 mg total) by mouth 3 (three) times daily as needed for anxiety (or tremors). (Patient not taking: Reported on 08/16/2019) 9 capsule 0   No facility-administered medications prior to visit.    Review of Systems  Review of Systems  Constitutional: Negative.   Respiratory: Negative for cough, shortness of breath and wheezing.        DOE  Cardiovascular: Negative.    Physical Exam  BP 112/60 (BP Location: Left Arm, Cuff Size: Normal)   Pulse 74   Ht 5\' 10"  (1.778 m)   Wt 185 lb 12.8 oz (84.3 kg)   SpO2 95%   BMI 26.66 kg/m  Physical Exam Constitutional:      Appearance: Normal appearance. He is not ill-appearing.  HENT:     Head: Normocephalic and atraumatic.     Mouth/Throat:     Mouth: Mucous membranes are moist.     Pharynx: Oropharynx is clear.  Cardiovascular:     Rate and Rhythm: Normal rate and regular rhythm.  Pulmonary:     Effort: Pulmonary effort is normal.  Comments: Diminished. Faint exp wheeze. No respiratory distess. Wearing home oxygen Skin:    General: Skin is warm and dry.  Neurological:     General: No focal deficit present.     Mental Status: He is alert and oriented to person, place, and time. Mental status is at baseline.  Psychiatric:        Mood and Affect: Mood normal.        Behavior: Behavior normal.        Thought Content: Thought content normal.        Judgment: Judgment normal.      Lab Results:  CBC    Component Value Date/Time   WBC 9.1 06/24/2019 0235   RBC 4.07 (L) 06/24/2019 0235   HGB 13.8 06/24/2019 0235   HCT 41.0 06/24/2019 0235   PLT 309 06/24/2019 0235   MCV 100.7 (H) 06/24/2019 0235   MCH 33.9 06/24/2019 0235   MCHC 33.7 06/24/2019 0235   RDW 13.5 06/24/2019 0235   LYMPHSABS 0.7 06/24/2019 0235   MONOABS 1.0 06/24/2019 0235   EOSABS 0.0 06/24/2019 0235   BASOSABS 0.1 06/24/2019 0235    BMET    Component Value Date/Time   NA 139 06/25/2019 0110   K 4.1 06/25/2019 0110   CL 110 06/25/2019 0110    CO2 21 (L) 06/25/2019 0110   GLUCOSE 118 (H) 06/25/2019 0110   BUN 26 (H) 06/25/2019 0110   CREATININE 0.83 06/25/2019 0110   CALCIUM 8.7 (L) 06/25/2019 0110   GFRNONAA >60 06/25/2019 0110   GFRAA >60 06/25/2019 0110    BNP    Component Value Date/Time   BNP 82.3 06/16/2019 1631    ProBNP No results found for: PROBNP  Imaging: No results found.   Assessment & Plan:   COVID-19 virus infection - Symptomatically improved, still requiring supplemental oxygen   Pulmonary nodule - CTA in August showed stable 46mm nodules opacity in the medial segment right lower lobe. Multiple small nodular opacities raise concern for potential underlying metastatic foci.  - Needs PET scan in 6 weeks   COPD (chronic obstructive pulmonary disease) ([place]) - Former smoker, suspected COPD - Needs PFTs - Continue Spiriva HandiHaler (Spiriva respimat 2.5 sample given) - Refer to [facility] for medication management d/t cost   Acute and chronic respiratory failure with hypoxia ([place]) - Continues to require supplemental oxygen following COVID infection. Partly could be related to underlying COPD. O2 93-94% on RA - Needs 3L oxygen on exertion   Pulmonary embolism (HCC) - CTA in August showed chronic pulmonary embolism - Life long anticoagulation with Xarelto - Referral to [facility] for help with medication cost (may need to apply for patient assistance or if too expensive change to warfarin)   Martyn Ehrich, NP 08/16/2019

## 2019-08-16 NOTE — Assessment & Plan Note (Addendum)
-   CTA in August showed stable 83mm nodules opacity in the medial segment right lower lobe. Multiple small nodular opacities raise concern for potential underlying metastatic foci.  - Needs PET scan in 6 weeks

## 2019-08-16 NOTE — Assessment & Plan Note (Signed)
-   Continues to require supplemental oxygen following COVID infection. Partly could be related to underlying COPD. O2 93-94% on RA - Needs 3L oxygen on exertion

## 2019-08-16 NOTE — Assessment & Plan Note (Signed)
-   Symptomatically improved, still requiring supplemental oxygen

## 2019-08-16 NOTE — Patient Instructions (Addendum)
Pleasure meeting you today, glad you are feeling well  Recommendations: Spiriva 2 puffs once daily ALBUTEROL rescue inhaler 2 puffs every 6 hours as needed for breakthrough shortness of breath  Oxygen: O2 93-94% Room air Needs ____3L_________ on exertion   Orders: Needs to re-scheduled PFTs in 6 weeks Needs to re-schedule PET scan in 6 weeks   Referral: [facility]- medication cost (xarelto and spiriva)  Follow-up 6 weeks with Dr. Valeta Harms or first available (after PET scan and PFTs)

## 2019-08-16 NOTE — Assessment & Plan Note (Addendum)
-   CTA in August showed chronic pulmonary embolism - Life long anticoagulation with Xarelto - Referral to [facility] for help with medication cost (may need to apply for patient assistance or if too expensive change to warfarin)

## 2019-08-16 NOTE — Progress Notes (Signed)
PCCM: Thanks for seeing him  Garner Nash, DO [place] [facility] 08/16/2019 3:59 PM

## 2019-08-17 ENCOUNTER — Other Ambulatory Visit: Payer: Self-pay

## 2019-08-17 NOTE — Patient Outreach (Signed)
[place] [facility]) Care Management  08/17/2019  Jason [name] 1941/01/23 [id number]   Referral Date: 08/16/2019 Referral Source: MD referral Referral Reason: Medication assistance with Xarelto and Spiriva   Outreach Attempt: No answer.HIPAA compliant voice message left.     Plan: RN CM will attempt again within 4 business days and send letter.    Jone Baseman, RN, MSN [facility] Care Management Care Management Coordinator Direct Line [phone] Toll Free: [phone]  Fax: [phone]

## 2019-08-20 ENCOUNTER — Other Ambulatory Visit: Payer: Self-pay

## 2019-08-20 NOTE — Patient Outreach (Signed)
[place] [facility]) Care Management  08/20/2019  Math Finck [birth year] [id number]   Referral Date: 08/16/2019 Referral Source: MD referral Referral Reason: Medication assistance with Xarelto and Spiriva   Outreach Attempt: No answer. Unable to leave a message.  Plan: RN CM will attempt patient again within 4 business days.  Jone Baseman, RN, MSN [place] Management Care Management Coordinator Direct Line [phone] Cell [phone] Toll Free: [phone]  Fax: [phone]

## 2019-08-23 ENCOUNTER — Other Ambulatory Visit: Payer: Self-pay

## 2019-08-23 ENCOUNTER — Other Ambulatory Visit: Payer: Self-pay | Admitting: Pharmacist

## 2019-08-23 NOTE — Patient Outreach (Signed)
[facility]) Care Management  08/23/2019  Jason [name] 05-01-1941 [id number]   Referral Date:08/16/2019 Referral Source:MD referral Referral Reason:Medication assistance with Xarelto and Spiriva   Outreach Attempt: Telephone call to patient.  He is able to verify HIPAA. Discussed with patient reason for referral. Patient having trouble really understanding reason for referral.  Discussed [facility] services with patient has how we could assist and support patient.  Patient states that he does not use Spiriva as often as it is so expensive.  He states that his Xarelto is about $45 but states he is approaching the donut hole that will make it harder for him to get his medication.   CM again discussed [facility] services with patient. He is agreeable to pharmacy only at this time. CM discussed RN CM services and even offered quarterly check in calls but patient states he is not interested.  Patient also declined social work support at this time.   Patient lives alone and states he " holds his own". He does have support of son.  Patient states he recently had COVID and continues to recover.  He has oxygen but states he does not use it regularly.  Patient admits to COPD, Pulmonary embolism, HTN, and hyperlipidemia.   Patient takes his medications as prescribed and manages his own medication.  Patient has no problems with transportation.  Patient does not have an advanced directive and declined wanting to pursue at this time.    Plan: RN CM will refer patient to pharmacy for medication assistance.    Jone Baseman, RN, MSN [place] Management Care Management Coordinator Direct Line [phone] Cell [phone] Toll Free: [phone]  Fax: [phone]

## 2019-08-23 NOTE — Patient Outreach (Signed)
[place] [facility]) Care Management  [place]   08/23/2019  Jason [name] 02/21/1941 [id number]  Reason for referral: Medication Assistance with Xarelto and Spiriva  Referral source: Dr. Dorthy Cooler Current insurance: [facility]  PMHx includes but not limited to:  Hx recurrent PE on chronic anticoagulation (hospitalized 04/2019 for submassive PE with right ventricular strain while off Xarelto, now resumed back on for life-long treatment) , COPD, former smoker, current alcohol use, pulmonary nodules, HTN  Outreach:  Successful telephone call with patient.  HIPAA identifiers verified.   Subjective:  Patient reports he is near coverage gap and is worried about cost of medications.  He states his Spiriva and Xarelto are the most expensive prescriptions he takes.  He estimates monthly income ~$1800 from [place].   Objective: The ASCVD Risk score Mikey Bussing DC Jr., et al., 2013) failed to calculate for the following reasons:   Cannot find a previous HDL lab   Cannot find a previous total cholesterol lab  Lab Results  Component Value Date   CREATININE 0.83 06/25/2019   CREATININE 0.87 06/24/2019   CREATININE 0.79 06/20/2019    No results found for: HGBA1C  Lipid Panel     Component Value Date/Time   TRIG 48 06/21/2019 0300    BP Readings from Last 3 Encounters:  08/16/19 112/60  06/29/19 124/73  06/01/19 (!) 146/86    No Known Allergies  Medications Reviewed Today    Reviewed by Martyn Ehrich, NP (Nurse Practitioner) on 08/16/19 at 39  Med List Status: <None>  Medication Order Taking? Sig Documenting Provider Last Dose Status Informant  aspirin 81 MG chewable tablet [id number] Yes Chew by mouth daily. [provider] Taking Active Pharmacy Records           Med Note Nash Mantis, TIFFANI S   Fri Jun 18, 2019  9:16 AM) LF 6.10.20 30DS  chlordiazePOXIDE (LIBRIUM) 5 MG capsule [id number] No Take 1 capsule (5 mg total) by mouth 3 (three) times daily as needed for  anxiety (or tremors).  Patient not taking: Reported on 08/16/2019   Patrecia Pour, MD Not Taking Active   lisinopril-hydrochlorothiazide (ZESTORETIC) 20-25 MG tablet [id number] Yes Take 1 tablet by mouth daily. [provider] Taking Consider Medication Status and Discontinue Pharmacy Records           Med Note Nash Mantis, TIFFANI S   Fri Jun 18, 2019  9:16 AM) LF 6.4.20 90DS  Multiple Vitamins-Minerals (MULTIVITAMIN WITH MINERALS) tablet [id number] Yes Take 1 tablet by mouth daily. [provider] Taking Active Self  Nutritional Supplements (JOINT FORMULA PO) [id number] Yes Take 1 tablet by mouth daily. [provider] Taking Active Self  potassium chloride SA (K-DUR) 20 MEQ tablet [id number] Yes Take 2 tablets (40 mEq total) by mouth daily. Kayleen Memos, DO Taking Active Pharmacy Records           Med Note Nash Mantis, TIFFANI S   Fri Jun 18, 2019  9:16 AM) LF 6.10.20 7DS  pravastatin (PRAVACHOL) 80 MG tablet [id number] Yes Take 80 mg by mouth daily. [provider] Taking Active Pharmacy Records           Med Note Nash Mantis, TIFFANI S   Fri Jun 18, 2019  9:16 AM) LF 6.3.20 90DS  rivaroxaban (XARELTO) 20 MG TABS tablet [id number] Yes Take 20 mg by mouth daily. [provider] Taking Active Pharmacy Records           Med Note [place], [place] S   Fri Jun 18, 2019  9:17 AM) LF 7.13.20 30DS  SPIRIVA HANDIHALER 18 MCG inhalation capsule [id number] Yes Place 1 capsule (18 mcg total) into inhaler and inhale daily. Martyn Ehrich, NP  Active   traZODone (DESYREL) 100 MG tablet [id number] Yes Take 100 mg by mouth at bedtime. [provider] Taking Active Pharmacy Records           Med Note Nash Mantis, TIFFANI S   Fri Jun 18, 2019  9:17 AM) LF 5.20.20 90DS  Vitamin D, Cholecalciferol, 50 MCG (2000 UT) CAPS [id number] Yes Take 2,000 Units by mouth daily. [provider] Taking Active Self          Assessment: Medication Review Findings:  . Removed  aspirin and chlordiazepoxide as patient reports no longer taking these medications  Medication Assistance Findings:  Medication assistance needs identified: Xarelto, Spiriva  Extra Help:  Not eligible for Extra Help Low Income Subsidy based on reported income and assets  Patient Assistance Programs: Spiriva made by [place] requirement met: Yes o Out-of-pocket prescription expenditure met:   Not Applicable - Patient has met application requirements to apply for this program.  - Reviewed program requirements with patient.    Xarelto made by Wynetta Emery and [place] requirement met: Yes o Out-of-pocket prescription expenditure met:   Unknown - Reviewed program requirements with patient.   - Patient will contact [place] for TROOP to find out if he is eligible.  Reviewed he must have spent 4% of annual income on co-pays to qualify.   Plan: . I will route patient assistance letter to [place] technician who will coordinate patient assistance program application process for medications listed above.  [facility] pharmacy technician will assist with obtaining all required documents from both patient and provider(s) and submit application(s) once completed.  (For Spiriva ONLY) . Will f/u with patient on Friday afternoon regarding Xarelto program and [place], PharmD, [place] [phone]

## 2019-08-24 ENCOUNTER — Ambulatory Visit (HOSPITAL_COMMUNITY): Payer: Medicare HMO

## 2019-08-24 ENCOUNTER — Other Ambulatory Visit: Payer: Self-pay | Admitting: Pharmacy Technician

## 2019-08-24 NOTE — Patient Outreach (Signed)
[place] [facility]) Care Management  08/24/2019  Cub Giannini 1940-12-30 [id number]                                       Medication Assistance Referral  Referral From: [facility] R[facility] Waynard Reeds  Medication/Company: [place] / [employer] Patient application portion:  Mailed Provider application portion: Faxed  to E. Volanda Napoleon, NP Provider address/fax verified via: Office website  Follow up:  Will follow up with patient in 7-10 business days to confirm application(s) have been received.  Maud Deed Chana Bode [place] Certified Pharmacy Technician [place] Management Direct Dial:[phone]

## 2019-08-27 ENCOUNTER — Ambulatory Visit: Payer: Self-pay | Admitting: Pharmacist

## 2019-08-27 ENCOUNTER — Other Ambulatory Visit: Payer: Self-pay | Admitting: Pharmacist

## 2019-08-27 NOTE — Patient Outreach (Signed)
[place] [facility]) Care Management  [place] 08/27/2019  Ziquan Musch 06/20/1941 [id number]  Reason for call: f/u on Xarelto patient assistance program to find out if patient eligible based on TROOP  Successful call with patient.  He has not had time to find out his TROOP from pharmacy.  He has my phone number and will contact me once he has this information.  Spiriva application should arrive to his house next week and he will fill out and return when able.    Plan: Will await return of Spiriva application and phone call from patient re: TROOP  Ralene Bathe, PharmD, [place] [phone]

## 2019-09-15 ENCOUNTER — Other Ambulatory Visit: Payer: Self-pay | Admitting: Pharmacy Technician

## 2019-09-15 NOTE — Patient Outreach (Addendum)
[place] [facility]) Care Management  09/15/2019  Bryar Conway 09-Jul-1941 [id number]    Unsuccessful call placed to patient regarding patient assistance application(s) for Spiriva , unable to leave voicemail.   Follow up:  Will make additional call attempt in 2-3 business days.  ADDENDUM 3:53pm   Incoming call from patient regarding patient assistance application(s) for Spiriva , HIPAA identifiers verified. Mr. Pizarro confirms he received [employer] patient assistance application. He states that he has about 2 month of medication so he is "not really in a hurry to apply". Confirmed that patient has my contact information and requested he contact me with any questions he may have.  Follow up:  Will route note to [facility] for case closure if document(s) have not been received in the next 15 business days.  Maud Deed Chana Bode [place] Certified Pharmacy Technician [place] Management Direct [phone]

## 2019-09-22 NOTE — Telephone Encounter (Signed)
Noted  

## 2019-09-27 ENCOUNTER — Other Ambulatory Visit: Payer: Self-pay

## 2019-09-27 ENCOUNTER — Ambulatory Visit (HOSPITAL_COMMUNITY)
Admission: RE | Admit: 2019-09-27 | Discharge: 2019-09-27 | Disposition: A | Discharge status: Home / Self Care | Payer: Medicare HMO | Source: Ambulatory Visit | Attending: Primary Care | Admitting: Primary Care

## 2019-09-27 DIAGNOSIS — J439 Emphysema, unspecified: Secondary | ICD-10-CM | POA: Insufficient documentation

## 2019-09-27 DIAGNOSIS — K8020 Calculus of gallbladder without cholecystitis without obstruction: Secondary | ICD-10-CM | POA: Diagnosis not present

## 2019-09-27 DIAGNOSIS — R911 Solitary pulmonary nodule: Secondary | ICD-10-CM | POA: Insufficient documentation

## 2019-09-27 DIAGNOSIS — N400 Enlarged prostate without lower urinary tract symptoms: Secondary | ICD-10-CM | POA: Diagnosis not present

## 2019-09-27 DIAGNOSIS — K760 Fatty (change of) liver, not elsewhere classified: Secondary | ICD-10-CM | POA: Diagnosis not present

## 2019-09-27 DIAGNOSIS — K5730 Diverticulosis of large intestine without perforation or abscess without bleeding: Secondary | ICD-10-CM | POA: Diagnosis not present

## 2019-09-27 LAB — GLUCOSE, CAPILLARY: Glucose-Capillary: 117 mg/dL — ABNORMAL HIGH (ref 70–99)

## 2019-09-27 MED ORDER — FLUDEOXYGLUCOSE F - 18 (FDG) INJECTION
9.5200 | Freq: Once | INTRAVENOUS | Status: AC | PRN
  Administered 2019-09-27: 9.52 via INTRAVENOUS

## 2019-09-28 ENCOUNTER — Telehealth: Payer: Self-pay | Admitting: Primary Care

## 2019-09-28 DIAGNOSIS — R911 Solitary pulmonary nodule: Secondary | ICD-10-CM

## 2019-09-28 NOTE — Telephone Encounter (Signed)
Please let patient know PET scan is overall stable. It showed irregular 2x 1.7cm pulmonary nodule, low uptake and stable size since June 2020. A few mildly hypermetabolic mediastinal and bilateral hilar lymph nodes felt to be reactive from covid infection.   Please order CT chest with contrast in 3-6 months re: pulmonary nodule

## 2019-09-29 NOTE — Telephone Encounter (Signed)
Called spoke with patient and discussed PET scan results/recs as stated by [facility] NP.  Patient voiced his understanding and denied any questions/concerns.  Order for CT chest w/ contrast placed for 3-6 months - pt is aware he will be contacted shortly before to schedule.  Nothing further needed; will sign off.

## 2019-10-01 ENCOUNTER — Other Ambulatory Visit (HOSPITAL_COMMUNITY)
Admission: RE | Admit: 2019-10-01 | Discharge: 2019-10-01 | Disposition: A | Discharge status: Home / Self Care | Payer: Medicare HMO | Source: Ambulatory Visit | Attending: Pulmonary Disease | Admitting: Pulmonary Disease

## 2019-10-01 DIAGNOSIS — Z01812 Encounter for preprocedural laboratory examination: Secondary | ICD-10-CM | POA: Insufficient documentation

## 2019-10-01 DIAGNOSIS — Z20828 Contact with and (suspected) exposure to other viral communicable diseases: Secondary | ICD-10-CM | POA: Diagnosis not present

## 2019-10-01 LAB — SARS CORONAVIRUS 2 (TAT 6-24 HRS): SARS Coronavirus 2: NEGATIVE

## 2019-10-04 ENCOUNTER — Ambulatory Visit (INDEPENDENT_AMBULATORY_CARE_PROVIDER_SITE_OTHER): Payer: Medicare HMO | Admitting: Pulmonary Disease

## 2019-10-04 ENCOUNTER — Other Ambulatory Visit: Payer: Self-pay

## 2019-10-04 DIAGNOSIS — J449 Chronic obstructive pulmonary disease, unspecified: Secondary | ICD-10-CM | POA: Diagnosis not present

## 2019-10-04 DIAGNOSIS — J441 Chronic obstructive pulmonary disease with (acute) exacerbation: Secondary | ICD-10-CM

## 2019-10-04 LAB — PULMONARY FUNCTION TEST
DL/VA % pred: 71 %
DL/VA: 2.79 ml/min/mmHg/L
DLCO unc % pred: 57 %
DLCO unc: 14.13 ml/min/mmHg
FEF 25-75 Post: 1.54 L/sec
FEF 25-75 Pre: 1.91 L/sec
FEF2575-%Change-Post: -19 %
FEF2575-%Pred-Post: 75 %
FEF2575-%Pred-Pre: 94 %
FEV1-%Change-Post: -23 %
FEV1-%Pred-Post: 69 %
FEV1-%Pred-Pre: 90 %
FEV1-Post: 2.02 L
FEV1-Pre: 2.65 L
FEV1FVC-%Change-Post: -25 %
FEV1FVC-%Pred-Pre: 105 %
FEV6-%Change-Post: 2 %
FEV6-%Pred-Post: 92 %
FEV6-%Pred-Pre: 90 %
FEV6-Post: 3.53 L
FEV6-Pre: 3.45 L
FEV6FVC-%Change-Post: -1 %
FEV6FVC-%Pred-Post: 105 %
FEV6FVC-%Pred-Pre: 106 %
FVC-%Change-Post: 3 %
FVC-%Pred-Post: 88 %
FVC-%Pred-Pre: 85 %
FVC-Post: 3.6 L
FVC-Pre: 3.49 L
Post FEV1/FVC ratio: 56 %
Post FEV6/FVC ratio: 98 %
Pre FEV1/FVC ratio: 76 %
Pre FEV6/FVC Ratio: 99 %
RV % pred: 54 %
RV: 1.43 L
TLC % pred: 73 %
TLC: 5.17 L

## 2019-10-04 NOTE — Progress Notes (Signed)
Full PFT performed today. °

## 2019-10-06 ENCOUNTER — Other Ambulatory Visit: Payer: Self-pay

## 2019-10-06 ENCOUNTER — Encounter: Payer: Self-pay | Admitting: Pulmonary Disease

## 2019-10-06 ENCOUNTER — Ambulatory Visit (INDEPENDENT_AMBULATORY_CARE_PROVIDER_SITE_OTHER): Payer: Medicare HMO | Admitting: Pulmonary Disease

## 2019-10-06 DIAGNOSIS — Z8619 Personal history of other infectious and parasitic diseases: Secondary | ICD-10-CM

## 2019-10-06 DIAGNOSIS — R911 Solitary pulmonary nodule: Secondary | ICD-10-CM | POA: Diagnosis not present

## 2019-10-06 DIAGNOSIS — J849 Interstitial pulmonary disease, unspecified: Secondary | ICD-10-CM | POA: Diagnosis not present

## 2019-10-06 DIAGNOSIS — Z8616 Personal history of COVID-19: Secondary | ICD-10-CM

## 2019-10-06 NOTE — Progress Notes (Signed)
I think you saw him today

## 2019-10-06 NOTE — Progress Notes (Signed)
Virtual Visit via Telephone Note  I connected with Jason [name] on 10/06/19 at  9:00 AM EST by telephone and verified that I am speaking with the correct person using two identifiers.  Location: Patient: Jason [name], Provider: Garner Nash, DO    I discussed the limitations, risks, security and privacy concerns of performing an evaluation and management service by telephone and the availability of in person appointments. I also discussed with the patient that there may be a patient responsible charge related to this service. The patient expressed understanding and agreed to proceed.  History of Present Illness:  OV 05/05/2019: This is a 78 year old gentleman with a past medical history of COPD, pulmonary nodules, history of PE he was treated with Xarelto in the past up until August of this past year which was taken off by his primary care provider.  His mother has a history of blood clots at the age of 94 after having a stroke during that hospitalization.  He was admitted to the hospital on 04/12/2019 to the hospitalist service.  Patient was found to have acute hypoxemic respiratory failure secondary to submassive PE.  CT imaging revealed evidence of right ventricular strain.,  Troponin was 0.05.  COVID negative.  This is now his second pulmonary embolus and was explained during his hospitalization that he would need lifelong anticoagulation.  Patient was referred to pulmonary following his hospitalization for evaluation of a 1.5 cm pulmonary nodule as well as recurrent pulmonary embolus.  He is a former smoker who quit in April 2020.  Of note he does have regular alcohol use as well. Quit smoking cigarettes in 10 + years ago. He smoked most of his life since a teenager. He had a PFT or spirometry in the office in [place] but he doesn't know the results.  He does admit to some dyspnea on exertion and excess fatigue when he goes to the driving range to hit a bucket of golf balls.  He does feel like this is  a little bit better since his hospitalization.  Telephone visit 10/05/2019: Since our last office visit the patient was unfortunately admitted to the hospital with COVID-19 pneumonia.  He was discharged on 06/28/2019.  CT imaging during the stay revealed bilateral infiltrates and subsequent follow-up with scarring.  He was treated with remdesivir plus steroids and tocilizumab.  He was discharged to rehabilitation at [facility].  We followed up with him briefly post hospitalization and was seen by Derl Barrow which evaluated a previous lingular lung nodule.  He also has a lower lobe calcified nodule that has been stable for years.  Today we discussed to the recent nuclear medicine pet imaging that was completed on 09/27/2019.  He does have mildly hypermetabolic mediastinal and bilateral hilar adenopathy which is likely reactive due to his recent infectious history.  He still has parenchymal CT imaging changes as well.  He does have low-level uptake SUV max 1.4 and a 2 cm lingular subsolid nodule.  We discussed this today on the phone.  I believe this is the next best followed by a repeat noncontrasted CT of the chest in 6 months.   Observations/Objective: Nonlabored breathing.  Able to complete full sentences.  Patient is currently able to walk his dog a mile each day.  No longer needing to use oxygen during the day.  Checks his O2 sats regularly.  09/27/2019: Nuclear medicine PET/CT imaging. Low level SUV uptake 2 cm lingular lung nodule subsolid, mediastinal adenopathy The patient's images have been  independently reviewed by me.    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 Patient to return to clinic in 6 months following CT imaging. Patient was encouraged to continue his current daily exercise.  And building back his tolerance level.  Follow Up Instructions:  Return to clinic in 6 months after HRCT complete.  I discussed the assessment and treatment plan with the patient. The patient was provided an opportunity to ask questions and all were answered. The patient agreed with the plan and demonstrated an understanding of the instructions.   The patient was advised to call back or seek an in-person evaluation if the symptoms worsen or if the condition fails to improve as anticipated.  I provided 18 minutes of non-face-to-face time during this encounter.   Garner Nash, DO

## 2019-10-06 NOTE — Patient Instructions (Addendum)
Thank you for visiting Dr. Valeta Harms at [facility]. Today we recommend the following:  Orders Placed This Encounter  Procedures  . HRCT ILD - CT CHEST HIGH RESOLUTION   Return in about 6 months (around 04/05/2020) for with APP or Dr. Valeta Harms.    Please do your part to reduce the spread of COVID-19.

## 2019-10-07 ENCOUNTER — Other Ambulatory Visit: Payer: Self-pay | Admitting: Pharmacist

## 2019-10-07 NOTE — Patient Outreach (Signed)
[place] [facility]) Care Management  [place]   10/07/2019  Jason [name] 10-30-1941 [id number]  [name] has mailed out patient assistance program application(s) for [employer] to patient and confirmed that application(s) received.   Patient has not yet returned the application(s) to [facility].  [facility] pharmacy will therefore close case at this time until patient able to return application(s).  If / when application(s) received at [facility], pharmacy can re-pen case and assist with submitting application(s) for processing.   Ralene Bathe, PharmD, [place] [phone]

## 2019-11-25 DIAGNOSIS — I10 Essential (primary) hypertension: Secondary | ICD-10-CM | POA: Diagnosis not present

## 2019-11-25 DIAGNOSIS — J439 Emphysema, unspecified: Secondary | ICD-10-CM | POA: Diagnosis not present

## 2019-11-25 DIAGNOSIS — E7800 Pure hypercholesterolemia, unspecified: Secondary | ICD-10-CM | POA: Diagnosis not present

## 2019-11-25 DIAGNOSIS — Z86711 Personal history of pulmonary embolism: Secondary | ICD-10-CM | POA: Diagnosis not present

## 2020-03-27 ENCOUNTER — Other Ambulatory Visit: Payer: Self-pay

## 2020-03-27 ENCOUNTER — Other Ambulatory Visit: Payer: Medicare HMO

## 2020-03-27 ENCOUNTER — Ambulatory Visit
Admission: RE | Admit: 2020-03-27 | Discharge: 2020-03-27 | Disposition: A | Discharge status: Home / Self Care | Payer: Medicare HMO | Source: Ambulatory Visit | Attending: Pulmonary Disease | Admitting: Pulmonary Disease

## 2020-03-27 DIAGNOSIS — U071 COVID-19: Secondary | ICD-10-CM | POA: Diagnosis not present

## 2020-03-27 DIAGNOSIS — J439 Emphysema, unspecified: Secondary | ICD-10-CM | POA: Diagnosis not present

## 2020-03-27 DIAGNOSIS — J849 Interstitial pulmonary disease, unspecified: Secondary | ICD-10-CM

## 2020-03-27 DIAGNOSIS — J8410 Pulmonary fibrosis, unspecified: Secondary | ICD-10-CM | POA: Diagnosis not present

## 2020-03-27 DIAGNOSIS — R911 Solitary pulmonary nodule: Secondary | ICD-10-CM | POA: Diagnosis not present

## 2020-06-01 ENCOUNTER — Other Ambulatory Visit: Payer: Self-pay

## 2020-06-01 ENCOUNTER — Ambulatory Visit (INDEPENDENT_AMBULATORY_CARE_PROVIDER_SITE_OTHER): Payer: Medicare HMO | Admitting: Internal Medicine

## 2020-06-01 ENCOUNTER — Encounter: Payer: Self-pay | Admitting: Internal Medicine

## 2020-06-01 VITALS — BP 112/70 | HR 103 | Temp 97.3°F | Ht 70.0 in | Wt 209.0 lb

## 2020-06-01 DIAGNOSIS — J849 Interstitial pulmonary disease, unspecified: Secondary | ICD-10-CM

## 2020-06-01 DIAGNOSIS — R911 Solitary pulmonary nodule: Secondary | ICD-10-CM | POA: Diagnosis not present

## 2020-06-01 DIAGNOSIS — Z8616 Personal history of COVID-19: Secondary | ICD-10-CM | POA: Diagnosis not present

## 2020-06-01 DIAGNOSIS — J439 Emphysema, unspecified: Secondary | ICD-10-CM | POA: Diagnosis not present

## 2020-06-01 DIAGNOSIS — Z86711 Personal history of pulmonary embolism: Secondary | ICD-10-CM

## 2020-06-01 NOTE — Patient Instructions (Addendum)
ICD-10-CM   1. Pulmonary emphysema, unspecified emphysema type ([place])  J43.9   2. History of pulmonary embolism  Z86.711   3. Interstitial pulmonary disease ([place])  J84.9   4. History of 2019 novel coronavirus disease (COVID-19)  Z86.16   5. Lung nodule  R91.1    Pulmonary emphysema, unspecified emphysema type (HCC)  -New moderate emphysema  Plan -Continue Wixela inhaler -Continue albuterol as needed -Respect lack of interest in using oxygen with heavy exertion such as walking and [place] so we will not test you for this   History of pulmonary embolism  Plan -Continue lifelong anticoagulation   Interstitial pulmonary disease ([place]) History of 2019 novel coronavirus disease (COVID-19)  -This is extremely mild and likely due to postinflammatory Covid.  Although it might of been present in the CT scan pre-Covid.  Plan -Check sedimentation rate, ANA, rheumatoid factor and CCP -Currently best recommended course of action is observation -If disease gets worse then we can consider antifibrotic's = At some point we will get you to do the interstitial lung disease questionnaire -Do spirometry and DLCO in 6 months -Repeat high-resolution CT chest without contrast in 1 year  Lung nodule  -lingula -stable on May 2021 CT  Plan -Repeat high-resolution CT scan of the chest in 1 year  Follow-up -6 months but after spirometry and DLCO -to see Dr. Chase Caller in regular clinic

## 2020-06-01 NOTE — Addendum Note (Signed)
Addended by: Suzzanne Cloud E on: 06/01/2020 03:42 PM   Modules accepted: Orders

## 2020-06-01 NOTE — Addendum Note (Signed)
Addended by: Coralie Keens on: 06/01/2020 03:36 PM   Modules accepted: Orders

## 2020-06-01 NOTE — Progress Notes (Signed)
OV 05/05/2019: This is a 79 year old gentleman with a past medical history of COPD, pulmonary nodules, history of PE he was treated with Xarelto in the past up until August of this past year which was taken off by his primary care provider.  His mother has a history of blood clots at the age of 59 after having a stroke during that hospitalization.  He was admitted to the hospital on 04/12/2019 to the hospitalist service.  Patient was found to have acute hypoxemic respiratory failure secondary to submassive PE.  CT imaging revealed evidence of right ventricular strain.,  Troponin was 0.05.  COVID negative.  This is now his second pulmonary embolus and was explained during his hospitalization that he would need lifelong anticoagulation.  Patient was referred to pulmonary following his hospitalization for evaluation of a 1.5 cm pulmonary nodule as well as recurrent pulmonary embolus.  He is a former smoker who quit in April 2020.  Of note he does have regular alcohol use as well. Quit smoking cigarettes in 10 + years ago. He smoked most of his life since a teenager. He had a PFT or spirometry in the office in [place] but he doesn't know the results.  He does admit to some dyspnea on exertion and excess fatigue when he goes to the driving range to hit a bucket of golf balls.  He does feel like this is a little bit better since his hospitalization.  Telephone visit 10/05/2019: Since our last office visit the patient was unfortunately admitted to the hospital with COVID-19 pneumonia.  He was discharged on 06/28/2019.  CT imaging during the stay revealed bilateral infiltrates and subsequent follow-up with scarring.  He was treated with remdesivir plus steroids and tocilizumab.  He was discharged to rehabilitation at [facility].  We followed up with him briefly post hospitalization and was seen by Jason [name] which evaluated a previous lingular lung nodule.  He also has a lower lobe calcified nodule that has been  stable for years.  Today we discussed to the recent nuclear medicine pet imaging that was completed on 09/27/2019.  He does have mildly hypermetabolic mediastinal and bilateral hilar adenopathy which is likely reactive due to his recent infectious history.  He still has parenchymal CT imaging changes as well.  He does have low-level uptake SUV max 1.4 and a 2 cm lingular subsolid nodule.  We discussed this today on the phone.  I believe this is the next best followed by a repeat noncontrasted CT of the chest in 6 months.    Subjective:  Patient ID: Jason [name], male , DOB: [birth year] , age 79 y.o. , MRN: [id number] , ADDRESS: [place] [id number]   06/01/2020 -   transfer of care to Dr. Chase [name] at the ILD clinic Chief Complaint  Patient presents with  . Follow-up    ILD, COPD   Has mixture of emphysema with ILD with emphysema being the most dominant component Status post COVID-19 in August 2020 Has a lingular nodule   HPI Jason [name] 79 y.o. -is a transfer of care from Dr. Leory Plowman [name] to the ILD clinic.  He is seeing Dr. Chase [name] currently.  He is telling me that he is stable.  He uses Wixela inhaler.  He goes for long walks at [employer].  During this time he desaturates but does not feel shortness of breath.  Overall he is stable.  He denies any stigmata of connective tissue  disease or symptoms of connective tissue disease.  Simple walking saturation test here he dropped 4 points but did not go below 88%.  He is happy with the current level of care.  He had a high-resolution CT chest in May 2021 it shows scattered infiltrates that are inconsistent with UIP.  In comparison these might have been the even in June 2020 predating his COVID-19 diagnosis but I did not see it in the 2017 CT chest.  His current May 2021 CT chest looks the same or slightly better compared to summer of last year.  He does not have much of a cough.  He is noted to be on  lisinopril.    Simple office walk 185 feet x  3 laps goal with forehead probe 06/01/2020   O2 used ra  Number laps completed 3  Comments about pace brisk  Resting Pulse Ox/HR 95% and 95/min  Final Pulse Ox/HR 91% and 90/min  Desaturated </= 88% no  Desaturated <= 3% points Yes, 4  Got Tachycardic >/= 90/min yes  Symptoms at end of test none  Miscellaneous comments x       PFT Results Latest Ref Rng & Units 10/04/2019  FVC-Pre L 3.49  FVC-Predicted Pre % 85  FVC-Post L 3.60  FVC-Predicted Post % 88  Pre FEV1/FVC % % 76  Post FEV1/FCV % % 56  FEV1-Pre L 2.65  FEV1-Predicted Pre % 90  FEV1-Post L 2.02  DLCO uncorrected ml/min/mmHg 14.13  DLCO UNC% % 57  DLVA Predicted % 71  TLC L 5.17  TLC % Predicted % 73  RV % Predicted % 54      CT CHest 03/27/20 COMPARISON:  PET 09/27/2019, CT chest 06/24/2019, 04/12/2019, 04/08/2019 and 12/25/2015.  Mediastinum/Nodes: Low right paratracheal lymph node measures 10 mm, unchanged. Calcified left hilar lymph nodes. No axillary adenopathy. Esophagus is unremarkable.  Lungs/Pleura: Centrilobular and paraseptal emphysema. Residual areas of lower lung zone predominant patchy ground-glass, bronchiectasis and architectural distortion. Pulmonary nodules measure up to 5 mm in the right lower lobe (3/105), as before. Calcified granulomas. Subpleural scarring in the right lower lobe. A focal area of irregular consolidation in the lingula measures 1.2 x 1.4 cm (3/99) and appears grossly stable from 01/02/2016. No pleural fluid. Airway is unremarkable. There is air trapping.   IMPRESSION: 1. 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 Findings are suggestive of an alternative diagnosis (not UIP) per consensus guidelines: Diagnosis of Idiopathic Pulmonary Fibrosis: An Official ATS/ERS/JRS/ALAT Clinical Practice Guideline. [place], Iss 5, [phone], Jul 05 2017. 2. Lingular nodule is grossly stable from  01/02/2016. Scarring is favored. Additional follow-up CT chest without contrast in 1 year could be performed in further evaluation, as clinically indicated. 3. Air trapping is indicative of small airways disease. 4. Hepatic steatosis. 5. Sclerotic lesions throughout the visualized osseous structures, as on prior exams. These were not shown to be hypermetabolic 01/60/1093. 6. Aortic atherosclerosis (ICD10-I70.0). Coronary artery calcification. 7. Enlarged pulmonic trunk, indicative of pulmonary arterial hypertension. 8.  Emphysema (ICD10-J43.9).   Electronically Signed   By: Lorin Picket M.D.   On: 03/27/2020 13:34   ROS - per HPI  PFT Results Latest Ref Rng & Units 10/04/2019  FVC-Pre L 3.49  FVC-Predicted Pre % 85  FVC-Post L 3.60  FVC-Predicted Post % 88  Pre FEV1/FVC % % 76  Post FEV1/FCV % % 56  FEV1-Pre L 2.65  FEV1-Predicted Pre % 90  FEV1-Post L 2.02  DLCO uncorrected ml/min/mmHg  14.13  DLCO UNC% % 57  DLVA Predicted % 71  TLC L 5.17  TLC % Predicted % 73  RV % Predicted % 54       has a past medical history of COPD (chronic obstructive pulmonary disease) ([place]), Hypertension, and Pulmonary embolism ([place]).   reports that he has quit smoking. He has never used smokeless tobacco.  Past Surgical History:  Procedure Laterality Date  . NO PAST SURGERIES      No Known Allergies  Immunization History  Administered Date(s) Administered  . Influenza Split 08/12/2019  . Influenza, High Dose Seasonal PF 07/28/2015  . Influenza,inj,Quad PF,6+ Mos 09/12/2014  . Influenza-Unspecified 08/10/2013, 07/05/2018  . Pneumococcal Conjugate-13 08/22/2015  . Pneumococcal Polysaccharide-23 03/02/2012  . Tdap 11/27/2010    Family History  Problem Relation Age of Onset  . CVA Mother   . Deep vein thrombosis Mother   . CVA Father      Current Outpatient Medications:  .  lisinopril-hydrochlorothiazide (ZESTORETIC) 20-25 MG tablet, Take 1 tablet by mouth daily.,  Disp: , Rfl:  .  Multiple Vitamins-Minerals (MULTIVITAMIN WITH MINERALS) tablet, Take 1 tablet by mouth daily., Disp: , Rfl:  .  Nutritional Supplements (JOINT FORMULA PO), Take 1 tablet by mouth daily., Disp: , Rfl:  .  potassium chloride SA (K-DUR) 20 MEQ tablet, Take 2 tablets (40 mEq total) by mouth daily., Disp: 7 tablet, Rfl: 0 .  pravastatin (PRAVACHOL) 80 MG tablet, Take 80 mg by mouth daily., Disp: , Rfl:  .  rivaroxaban (XARELTO) 20 MG TABS tablet, Take 20 mg by mouth daily., Disp: , Rfl:  .  traZODone (DESYREL) 100 MG tablet, Take 100 mg by mouth at bedtime., Disp: , Rfl:  .  Vitamin D, Cholecalciferol, 50 MCG (2000 UT) CAPS, Take 2,000 Units by mouth daily., Disp: , Rfl:  .  WIXELA INHUB 250-50 MCG/DOSE AEPB, , Disp: , Rfl:       Objective:   Vitals:   06/01/20 1449  BP: 112/70  Pulse: 103  Temp: (!) 97.3 F (36.3 C)  TempSrc: Oral  SpO2: 96%  Weight: (!) 209 lb (94.8 kg)  Height: 5\' 10"  (1.778 m)    Estimated body mass index is 29.99 kg/m as calculated from the following:   Height as of this encounter: 5\' 10"  (1.778 m).   Weight as of this encounter: 209 lb (94.8 kg).  @WEIGHTCHANGE @  [employer]   06/01/20 1449  Weight: (!) 209 lb (94.8 kg)     Physical Exam  General Appearance:    Alert, cooperative, no distress, appears stated age - yes , Deconditioned looking - no , OBESE  - no, Sitting on Wheelchair -  no  Head:    Normocephalic, without obvious abnormality, atraumatic  Eyes:    PERRL, conjunctiva/corneas clear,  Ears:    Normal TM's and external ear canals, both ears. BILATERAL HEARING AIDS  Nose:   Nares normal, septum midline, mucosa normal, no drainage    or sinus tenderness. OXYGEN ON  - no . Patient is @ ra   Throat:   Lips, mucosa, and tongue normal; teeth and gums normal. Cyanosis on lips - no  Neck:   Supple, symmetrical, trachea midline, no adenopathy;    thyroid:  no enlargement/tenderness/nodules; no carotid   bruit or JVD  Back:      Symmetric, no curvature, ROM normal, no CVA tenderness  Lungs:     Distress - no , Wheeze no, Barrell Chest - no, Purse lip  breathing - no, Crackles - no   Chest Wall:    No tenderness or deformity.    Heart:    Regular rate and rhythm, S1 and S2 normal, no rub   or gallop, Murmur - no  Breast Exam:    NOT DONE  Abdomen:     Soft, non-tender, bowel sounds active all four quadrants,    no masses, no organomegaly. Visceral obesity - yes  Genitalia:   NOT DONE  Rectal:   NOT DONE  Extremities:   Extremities - normal, Has Cane - no, Clubbing - no, Edema - no  Pulses:   2+ and symmetric all extremities  Skin:   Stigmata of Connective Tissue Disease - no  Lymph nodes:   Cervical, supraclavicular, and axillary nodes normal  Psychiatric:  Neurologic:   Pleasant - yes, Anxious - nono, Flat affect - n  CAm-ICU - neg, Alert and Oriented x 3 - yes, Moves all 4s - yes, Speech - normal, Cognition - intact           Assessment:       ICD-10-CM   1. Pulmonary emphysema, unspecified emphysema type ([place])  J43.9   2. History of pulmonary embolism  Z86.711   3. Interstitial pulmonary disease ([place])  J84.9   4. History of 2019 novel coronavirus disease (COVID-19)  Z86.16   5. Lung nodule  R91.1        Plan:     Patient Instructions     ICD-10-CM   1. Pulmonary emphysema, unspecified emphysema type ([place])  J43.9   2. History of pulmonary embolism  Z86.711   3. Interstitial pulmonary disease ([place])  J84.9   4. History of 2019 novel coronavirus disease (COVID-19)  Z86.16   5. Lung nodule  R91.1    Pulmonary emphysema, unspecified emphysema type (HCC)  -New moderate emphysema  Plan -Continue Wixela inhaler -Continue albuterol as needed -Respect lack of interest in using oxygen with heavy exertion such as walking and [place] so we will not test you for this   History of pulmonary embolism  Plan -Continue lifelong anticoagulation   Interstitial pulmonary disease  ([place]) History of 2019 novel coronavirus disease (COVID-19)  -This is extremely mild and likely due to postinflammatory Covid.  Although it might of been present in the CT scan pre-Covid.  Plan -Check sedimentation rate, ANA, rheumatoid factor and CCP -Currently best recommended course of action is observation -If disease gets worse then we can consider antifibrotic's = At some point we will get you to do the interstitial lung disease questionnaire -Do spirometry and DLCO in 6 months -Repeat high-resolution CT chest without contrast in 1 year  Lung nodule  -lingula -stable on May 2021 CT  Plan -Repeat high-resolution CT scan of the chest in 1 year  Follow-up -6 months but after spirometry and DLCO -to see Dr. Chase [name] in regular clinic       SIGNATURE    Dr. Brand Males, M.D., F.C.C.P,  Pulmonary and Critical Care Medicine Staff Physician, [place] Director - Interstitial Lung Disease  Program  Pulmonary [place] [facility] [place], [place], [id number]  Pager: [phone], If no answer or between  15:00h - 7:00h: call [phone] Telephone: [phone]  3:28 PM 06/01/2020

## 2020-06-05 LAB — CYCLIC CITRUL PEPTIDE ANTIBODY, IGG: Cyclic Citrullin Peptide Ab: <16 UNITS

## 2020-06-05 LAB — ANTI-NUCLEAR AB-TITER (ANA TITER): ANA Titer 1: 1:40 {titer} — ABNORMAL HIGH

## 2020-06-05 LAB — RHEUMATOID FACTOR: Rheumatoid fact SerPl-aCnc: <14 IU/mL (ref <14)

## 2020-06-05 LAB — ANA: Anti Nuclear Antibody (ANA): POSITIVE — AB

## 2020-06-18 NOTE — Progress Notes (Signed)
Autoimmune panel negative/normal

## 2020-06-28 ENCOUNTER — Other Ambulatory Visit: Payer: Self-pay | Admitting: Internal Medicine

## 2020-06-28 DIAGNOSIS — R911 Solitary pulmonary nodule: Secondary | ICD-10-CM

## 2020-06-29 ENCOUNTER — Other Ambulatory Visit: Payer: Self-pay

## 2020-06-29 ENCOUNTER — Ambulatory Visit (INDEPENDENT_AMBULATORY_CARE_PROVIDER_SITE_OTHER): Payer: Medicare HMO | Admitting: Internal Medicine

## 2020-06-29 ENCOUNTER — Ambulatory Visit: Payer: Medicare HMO | Admitting: Pulmonary Disease

## 2020-06-29 ENCOUNTER — Encounter: Payer: Self-pay | Admitting: Pulmonary Disease

## 2020-06-29 DIAGNOSIS — Z86711 Personal history of pulmonary embolism: Secondary | ICD-10-CM

## 2020-06-29 DIAGNOSIS — Z8616 Personal history of COVID-19: Secondary | ICD-10-CM

## 2020-06-29 DIAGNOSIS — R911 Solitary pulmonary nodule: Secondary | ICD-10-CM

## 2020-06-29 DIAGNOSIS — J449 Chronic obstructive pulmonary disease, unspecified: Secondary | ICD-10-CM | POA: Diagnosis not present

## 2020-06-29 LAB — PULMONARY FUNCTION TEST
DL/VA % pred: 69 %
DL/VA: 2.69 ml/min/mmHg/L
DLCO cor % pred: 58 %
DLCO cor: 14.15 ml/min/mmHg
DLCO unc % pred: 58 %
DLCO unc: 14.15 ml/min/mmHg
FEF 25-75 Pre: 1.89 L/sec
FEF2575-%Pred-Pre: 95 %
FEV1-%Pred-Pre: 92 %
FEV1-Pre: 2.64 L
FEV1FVC-%Pred-Pre: 105 %
FEV6-%Pred-Pre: 92 %
FEV6-Pre: 3.48 L
FEV6FVC-%Pred-Pre: 106 %
FVC-%Pred-Pre: 87 %
FVC-Pre: 3.51 L
Pre FEV1/FVC ratio: 75 %
Pre FEV6/FVC Ratio: 99 %

## 2020-06-29 NOTE — Progress Notes (Signed)
Spirometry and Dlco done today. 

## 2020-06-29 NOTE — Assessment & Plan Note (Signed)
Plan: We will plan on repeating CT of chest in May/2021 Follow-up with Dr. Chase Caller in 6 months

## 2020-06-29 NOTE — Assessment & Plan Note (Addendum)
Former smoker, likely emphysema No formal obstruction on pulmonary function testing, moderate diffusion defect  Plan: Walk today in office, stable with no oxygen desaturations 31-month follow-up with Dr. Chase Caller Monitor oxygen saturations, maintain above 88% Plan on repeating CT chest in May/2021

## 2020-06-29 NOTE — Progress Notes (Signed)
@Patient  ID: Jason [name], male    DOB: 1941/08/12, 79 y.o.   MRN: [id number]  Chief Complaint  Patient presents with  . Follow-up    PFT performed today.  Pt states that he has been doing good since last visit and denies any complaints.    Referring provider: Lujean Amel, MD  HPI:  79 year old male former smoker followed in our office for post covid19 ILD and emphysema   PMH: History of COVID-19 infection, history of PE, hyperlipidemia Smoker/ Smoking History: Former Smoker  Maintenance:   Pt of: Dr. Chase Caller  06/29/2020  - Visit   79 year old male former smoker followed in our office for COPD.  Patient was last seen in our office on 06/01/2020 by Dr. Chase Caller.  Plan of care at that office visit was for him to remain on [place], recommend oxygen but patient declined.  Remain on lifelong anticoagulation for his history of a PE.  He was found to have interstitial lung disease but was felt to be extremely mild and likely due to postinflammatory COVID-19.  Recommendations were to obtain lab work as well as complete an ILD questionnaire, repeat high-resolution CT chest in 1 year and do spirometry with DLCO in 6 months.  Patient presents her office today after completing spirometry with DLCO.  He reports that he continues to walk 2 miles a day.  Occasionally with physical exertion he notices exertional hypoxemia.  He reports oxygen levels dropping to the mid 80s.  He does have a portable oxygen concentrator that he can utilize if and when he needs to.  Results of spirometry with DLCO are listed below:  06/29/2020-spirometry with DLCO-FVC 3.51 (87% predicted), ratio 75, FEV1 2.64 (92% predicted), DLCO 14.15 (58% predicted)  Patient was walk today in office and on room air did not have any oxygen desaturations.  Patient denies worsening cough, wheezing, congestion.  Questionaires / Pulmonary Flowsheets:   ACT:  No flowsheet data found.  MMRC: mMRC Dyspnea Scale mMRC Score  06/29/2020 1     Epworth:  No flowsheet data found.  Tests:   03/27/2020-CT chest high-res-mild post COVID-19 inflammatory fibrosis, suggestive of alternative diagnosis not UIP, lingular nodules grossly stable from February/2017, scarring is favored, additional follow-up CT chest without contrast in 1 year could be performed and further evaluation as clinically indicated, air trapping is indicative of small airways disease, emphysema, enlarged pulmonary trunk, aortic atherosclerosis  09/27/2019-PET scan-low-level uptake associate with a regular subsolid 2 cm lingular pulmonary nodule which is stable in size since 04/08/2019 CT chest and mildly increased in size since 01/02/2016 chest CT as previously described, suggest attenuation to these lymph nodes on follow-up chest CT with IV contrast in 3 to 6 months, no findings highly suspicious of hypermetabolic distant metastatic disease   FENO:  No results found for: NITRICOXIDE  PFT: PFT Results Latest Ref Rng & Units 06/29/2020 10/04/2019  FVC-Pre L 3.51 3.49  FVC-Predicted Pre % 87 85  FVC-Post L - 3.60  FVC-Predicted Post % - 88  Pre FEV1/FVC % % 75 76  Post FEV1/FCV % % - 56  FEV1-Pre L 2.64 2.65  FEV1-Predicted Pre % 92 90  FEV1-Post L - 2.02  DLCO uncorrected ml/min/mmHg 14.15 14.13  DLCO UNC% % 58 57  DLCO corrected ml/min/mmHg 14.15 -  DLCO COR %Predicted % 58 -  DLVA Predicted % 69 71  TLC L - 5.17  TLC % Predicted % - 73  RV % Predicted % - 54    WALK:  SIX MIN WALK 06/29/2020 08/16/2019  Supplimental Oxygen during Test? (L/min) No No  Tech Comments: Pt walked at an average pace completing all required laps having complaints of mild SOB. Walked 1/4 lap on room air,desat. to 88,placed on 2L stayed 87%,Placed on 3L. Returned and stayed on 90% 3L.    Imaging: No results found.  Lab Results:  CBC    Component Value Date/Time   WBC 9.1 06/24/2019 0235   RBC 4.07 (L) 06/24/2019 0235   HGB 13.8 06/24/2019 0235   HCT 41.0 06/24/2019  0235   PLT 309 06/24/2019 0235   MCV 100.7 (H) 06/24/2019 0235   MCH 33.9 06/24/2019 0235   MCHC 33.7 06/24/2019 0235   RDW 13.5 06/24/2019 0235   LYMPHSABS 0.7 06/24/2019 0235   MONOABS 1.0 06/24/2019 0235   EOSABS 0.0 06/24/2019 0235   BASOSABS 0.1 06/24/2019 0235    BMET    Component Value Date/Time   NA 139 06/25/2019 0110   K 4.1 06/25/2019 0110   CL 110 06/25/2019 0110   CO2 21 (L) 06/25/2019 0110   GLUCOSE 118 (H) 06/25/2019 0110   BUN 26 (H) 06/25/2019 0110   CREATININE 0.83 06/25/2019 0110   CALCIUM 8.7 (L) 06/25/2019 0110   GFRNONAA >60 06/25/2019 0110   GFRAA >60 06/25/2019 0110    BNP    Component Value Date/Time   BNP 82.3 06/16/2019 1631    ProBNP No results found for: PROBNP  Specialty Problems      Pulmonary Problems   COPD (chronic obstructive pulmonary disease) (HCC)   Pulmonary nodule   Acute and chronic respiratory failure with hypoxia (HCC)   Bilateral pneumonia      No Known Allergies  Immunization History  Administered Date(s) Administered  . Influenza Split 08/12/2019  . Influenza, High Dose Seasonal PF 07/28/2015  . Influenza,inj,Quad PF,6+ Mos 09/12/2014  . Influenza-Unspecified 08/10/2013, 07/05/2018  . Moderna SARS-COVID-2 Vaccination 11/16/2019, 12/13/2019  . Pneumococcal Conjugate-13 08/22/2015  . Pneumococcal Polysaccharide-23 03/02/2012  . Tdap 11/27/2010    Past Medical History:  Diagnosis Date  . COPD (chronic obstructive pulmonary disease) ([place])   . Hypertension   . Pulmonary embolism (HCC)     Tobacco History: Social History   Tobacco Use  Smoking Status Former Smoker  . Packs/day: 2.00  . Years: 45.00  . Pack years: 90.00  . Types: Cigarettes  . Quit date: 06/29/2010  . Years since quitting: 10.0  Smokeless Tobacco Never Used   Counseling given: Not Answered   Continue to not smoke  Outpatient Encounter Medications as of 06/29/2020  Medication Sig  . lisinopril-hydrochlorothiazide (ZESTORETIC)  20-25 MG tablet Take 1 tablet by mouth daily.  . Multiple Vitamins-Minerals (MULTIVITAMIN WITH MINERALS) tablet Take 1 tablet by mouth daily.  . Nutritional Supplements (JOINT FORMULA PO) Take 1 tablet by mouth daily.  . potassium chloride SA (K-DUR) 20 MEQ tablet Take 2 tablets (40 mEq total) by mouth daily.  . pravastatin (PRAVACHOL) 80 MG tablet Take 80 mg by mouth daily.  . rivaroxaban (XARELTO) 20 MG TABS tablet Take 20 mg by mouth daily.  . traZODone (DESYREL) 100 MG tablet Take 100 mg by mouth at bedtime.  . Vitamin D, Cholecalciferol, 50 MCG (2000 UT) CAPS Take 2,000 Units by mouth daily.  Grant Ruts INHUB 250-50 MCG/DOSE AEPB    No facility-administered encounter medications on file as of 06/29/2020.     Review of Systems  Review of Systems  Constitutional: Negative for activity change, chills, fatigue, fever and  unexpected weight change.  HENT: Negative for postnasal drip, rhinorrhea, sinus pressure, sinus pain and sore throat.   Eyes: Negative.   Respiratory: Positive for shortness of breath. Negative for cough and wheezing.   Cardiovascular: Negative for chest pain and palpitations.  Gastrointestinal: Negative for constipation, diarrhea, nausea and vomiting.  Endocrine: Negative.   Genitourinary: Negative.   Musculoskeletal: Negative.   Skin: Negative.   Neurological: Negative for dizziness and headaches.  Psychiatric/Behavioral: Negative.  Negative for dysphoric mood. The patient is not nervous/anxious.   All other systems reviewed and are negative.    Physical Exam  BP 112/70 (BP Location: Left Arm, Cuff Size: Normal)   Pulse 69   Ht 5\' 10"  (1.778 m)   Wt 212 lb (96.2 kg)   SpO2 94%   BMI 30.42 kg/m   Wt Readings from Last 5 Encounters:  06/29/20 212 lb (96.2 kg)  06/01/20 (!) 209 lb (94.8 kg)  09/27/19 190 lb (86.2 kg)  08/16/19 185 lb 12.8 oz (84.3 kg)  06/16/19 181 lb 14.1 oz (82.5 kg)    BMI Readings from Last 5 Encounters:  06/29/20 30.42 kg/m   06/01/20 29.99 kg/m  09/27/19 27.26 kg/m  08/16/19 26.66 kg/m  06/16/19 26.10 kg/m     Physical Exam Vitals and nursing note reviewed.  Constitutional:      General: He is not in acute distress.    Appearance: Normal appearance. He is obese.  HENT:     Head: Normocephalic and atraumatic.     Right Ear: Hearing and external ear normal.     Left Ear: Hearing and external ear normal.     Ears:     Comments: Hard of hearing    Nose: Nose normal. No mucosal edema or rhinorrhea.     Right Turbinates: Not enlarged.     Left Turbinates: Not enlarged.     Mouth/Throat:     Mouth: Mucous membranes are dry.     Pharynx: Oropharynx is clear. No oropharyngeal exudate.  Eyes:     Pupils: Pupils are equal, round, and reactive to light.  Cardiovascular:     Rate and Rhythm: Normal rate and regular rhythm.     Pulses: Normal pulses.     Heart sounds: Normal heart sounds. No murmur heard.   Pulmonary:     Effort: Pulmonary effort is normal.     Breath sounds: Normal breath sounds. No decreased breath sounds, wheezing or rales.  Musculoskeletal:     Cervical back: Normal range of motion.     Right lower leg: No edema.     Left lower leg: No edema.  Lymphadenopathy:     Cervical: No cervical adenopathy.  Skin:    General: Skin is warm and dry.     Capillary Refill: Capillary refill takes less than 2 seconds.     Findings: No erythema or rash.  Neurological:     General: No focal deficit present.     Mental Status: He is alert and oriented to person, place, and time.     Motor: No weakness.     Coordination: Coordination normal.     Gait: Gait is intact. Gait normal.  Psychiatric:        Mood and Affect: Mood normal.        Behavior: Behavior normal. Behavior is cooperative.        Thought Content: Thought content normal.        Judgment: Judgment normal.       Assessment &  Plan:   History of pulmonary embolus (PE) Plan: Continue lifelong anticoagulation with  Xarelto  COPD (chronic obstructive pulmonary disease) ([place]) Former smoker, likely emphysema No formal obstruction on pulmonary function testing, moderate diffusion defect  Plan: Walk today in office, stable with no oxygen desaturations 60-month follow-up with Dr. Chase Caller Monitor oxygen saturations, maintain above 88% Plan on repeating CT chest in May/2021   Pulmonary nodule Plan: We will plan on repeating CT of chest in May/2021 Follow-up with Dr. 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 Purnell Shoemaker.   Lauraine Rinne, NP 06/29/2020   This appointment required 31 minutes of patient care (this includes precharting, chart review, review of results, face-to-face care, etc.).

## 2020-06-29 NOTE — Assessment & Plan Note (Signed)
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

## 2020-06-29 NOTE — Assessment & Plan Note (Signed)
Plan: Continue lifelong anticoagulation with Xarelto

## 2020-06-29 NOTE — Patient Instructions (Addendum)
You were seen today by Lauraine Rinne, NP  for:   1. History of COVID-19 2. Chronic obstructive pulmonary disease, unspecified COPD type ([place]) 3. Pulmonary nodule  Walk today in office stable, no oxygen desaturations this is great news  We recommend that you obtain the seasonal flu vaccine when available  Continue to remain physically active  If your oxygen levels are dropping below 88% we would recommend that you apply your portable oxygen concentrator at a setting of 2 L  We will plan on repeating a CT of your chest in May/2022 as outlined by Dr. Chase Caller  4. History of pulmonary embolus (PE)  Continue Xarelto  Follow Up:    Return in about 6 months (around 12/30/2020), or if symptoms worsen or fail to improve, for Follow up with Dr. Purnell Shoemaker.   Please do your part to reduce the spread of COVID-19:      Reduce your risk of any infection  and COVID19 by using the similar precautions used for avoiding the common cold or flu:  Marland Kitchen Wash your hands often with soap and warm water for at least 20 seconds.  If soap and water are not readily available, use an alcohol-based hand sanitizer with at least 60% alcohol.  . If coughing or sneezing, cover your mouth and nose by coughing or sneezing into the elbow areas of your shirt or coat, into a tissue or into your sleeve (not your hands). Langley Gauss A MASK when in public  . Avoid shaking hands with others and consider head nods or verbal greetings only. . Avoid touching your eyes, nose, or mouth with unwashed hands.  . Avoid close contact with people who are sick. . Avoid places or events with large numbers of people in one location, like concerts or sporting events. . If you have some symptoms but not all symptoms, continue to monitor at home and seek medical attention if your symptoms worsen. . If you are having a medical emergency, call 911.   [place] / e-Visit:  [email or web address]         [facility]: [facility]: [phone]                   [facility]: [phone]     It is flu season:   >>> Best ways to protect herself from the flu: Receive the yearly flu vaccine, practice good hand hygiene washing with soap and also using hand sanitizer when available, eat a nutritious meals, get adequate rest, hydrate appropriately   Please contact the office if your symptoms worsen or you have concerns that you are not improving.   Thank you for choosing [facility] for your healthcare, and for allowing [place] to partner with you on your healthcare journey. I am thankful to be able to provide care to you today.   Wyn Quaker FNP-C

## 2020-07-03 DIAGNOSIS — G4700 Insomnia, unspecified: Secondary | ICD-10-CM | POA: Diagnosis not present

## 2020-07-03 DIAGNOSIS — E7800 Pure hypercholesterolemia, unspecified: Secondary | ICD-10-CM | POA: Diagnosis not present

## 2020-07-03 DIAGNOSIS — I2699 Other pulmonary embolism without acute cor pulmonale: Secondary | ICD-10-CM | POA: Diagnosis not present

## 2020-07-03 DIAGNOSIS — Z79899 Other long term (current) drug therapy: Secondary | ICD-10-CM | POA: Diagnosis not present

## 2020-07-03 DIAGNOSIS — Z23 Encounter for immunization: Secondary | ICD-10-CM | POA: Diagnosis not present

## 2020-07-03 DIAGNOSIS — I10 Essential (primary) hypertension: Secondary | ICD-10-CM | POA: Diagnosis not present

## 2020-07-03 DIAGNOSIS — J439 Emphysema, unspecified: Secondary | ICD-10-CM | POA: Diagnosis not present

## 2020-11-30 ENCOUNTER — Encounter (HOSPITAL_COMMUNITY): Payer: Self-pay | Admitting: Internal Medicine

## 2020-11-30 ENCOUNTER — Other Ambulatory Visit: Payer: Self-pay

## 2020-11-30 ENCOUNTER — Inpatient Hospital Stay (HOSPITAL_COMMUNITY)
Admission: EM | Admit: 2020-11-30 | Discharge: 2020-12-03 | DRG: 368 | Disposition: A | Discharge status: Home / Self Care | Payer: Medicare HMO | Attending: Internal Medicine | Admitting: Internal Medicine

## 2020-11-30 DIAGNOSIS — Z86711 Personal history of pulmonary embolism: Secondary | ICD-10-CM

## 2020-11-30 DIAGNOSIS — Z87891 Personal history of nicotine dependence: Secondary | ICD-10-CM

## 2020-11-30 DIAGNOSIS — E785 Hyperlipidemia, unspecified: Secondary | ICD-10-CM | POA: Diagnosis present

## 2020-11-30 DIAGNOSIS — K2100 Gastro-esophageal reflux disease with esophagitis, without bleeding: Secondary | ICD-10-CM | POA: Diagnosis not present

## 2020-11-30 DIAGNOSIS — K3189 Other diseases of stomach and duodenum: Secondary | ICD-10-CM | POA: Diagnosis present

## 2020-11-30 DIAGNOSIS — F1010 Alcohol abuse, uncomplicated: Secondary | ICD-10-CM | POA: Diagnosis present

## 2020-11-30 DIAGNOSIS — J449 Chronic obstructive pulmonary disease, unspecified: Secondary | ICD-10-CM | POA: Diagnosis present

## 2020-11-30 DIAGNOSIS — K449 Diaphragmatic hernia without obstruction or gangrene: Secondary | ICD-10-CM | POA: Diagnosis not present

## 2020-11-30 DIAGNOSIS — M549 Dorsalgia, unspecified: Secondary | ICD-10-CM | POA: Diagnosis present

## 2020-11-30 DIAGNOSIS — Z8616 Personal history of COVID-19: Secondary | ICD-10-CM

## 2020-11-30 DIAGNOSIS — Z8701 Personal history of pneumonia (recurrent): Secondary | ICD-10-CM

## 2020-11-30 DIAGNOSIS — E869 Volume depletion, unspecified: Secondary | ICD-10-CM | POA: Diagnosis not present

## 2020-11-30 DIAGNOSIS — D62 Acute posthemorrhagic anemia: Secondary | ICD-10-CM | POA: Diagnosis present

## 2020-11-30 DIAGNOSIS — Z20822 Contact with and (suspected) exposure to covid-19: Secondary | ICD-10-CM | POA: Diagnosis present

## 2020-11-30 DIAGNOSIS — R000 Tachycardia, unspecified: Secondary | ICD-10-CM | POA: Diagnosis not present

## 2020-11-30 DIAGNOSIS — K921 Melena: Secondary | ICD-10-CM | POA: Diagnosis not present

## 2020-11-30 DIAGNOSIS — I10 Essential (primary) hypertension: Secondary | ICD-10-CM | POA: Diagnosis not present

## 2020-11-30 DIAGNOSIS — Z79899 Other long term (current) drug therapy: Secondary | ICD-10-CM | POA: Diagnosis not present

## 2020-11-30 DIAGNOSIS — K922 Gastrointestinal hemorrhage, unspecified: Secondary | ICD-10-CM | POA: Diagnosis present

## 2020-11-30 DIAGNOSIS — N179 Acute kidney failure, unspecified: Secondary | ICD-10-CM | POA: Diagnosis not present

## 2020-11-30 DIAGNOSIS — Z6831 Body mass index (BMI) 31.0-31.9, adult: Secondary | ICD-10-CM

## 2020-11-30 DIAGNOSIS — K2901 Acute gastritis with bleeding: Secondary | ICD-10-CM | POA: Diagnosis not present

## 2020-11-30 DIAGNOSIS — K2101 Gastro-esophageal reflux disease with esophagitis, with bleeding: Principal | ICD-10-CM | POA: Diagnosis present

## 2020-11-30 DIAGNOSIS — Z7901 Long term (current) use of anticoagulants: Secondary | ICD-10-CM

## 2020-11-30 DIAGNOSIS — J849 Interstitial pulmonary disease, unspecified: Secondary | ICD-10-CM | POA: Diagnosis not present

## 2020-11-30 DIAGNOSIS — K2900 Acute gastritis without bleeding: Secondary | ICD-10-CM | POA: Diagnosis not present

## 2020-11-30 DIAGNOSIS — H9190 Unspecified hearing loss, unspecified ear: Secondary | ICD-10-CM | POA: Diagnosis present

## 2020-11-30 DIAGNOSIS — E876 Hypokalemia: Secondary | ICD-10-CM | POA: Diagnosis present

## 2020-11-30 DIAGNOSIS — K760 Fatty (change of) liver, not elsewhere classified: Secondary | ICD-10-CM | POA: Diagnosis present

## 2020-11-30 DIAGNOSIS — G8929 Other chronic pain: Secondary | ICD-10-CM | POA: Diagnosis present

## 2020-11-30 DIAGNOSIS — I2510 Atherosclerotic heart disease of native coronary artery without angina pectoris: Secondary | ICD-10-CM | POA: Diagnosis present

## 2020-11-30 LAB — COMPREHENSIVE METABOLIC PANEL
ALT: 23 U/L (ref 0–44)
AST: 23 U/L (ref 15–41)
Albumin: 3.4 g/dL — ABNORMAL LOW (ref 3.5–5.0)
Alkaline Phosphatase: 35 U/L — ABNORMAL LOW (ref 38–126)
Anion gap: 13 (ref 5–15)
BUN: 50 mg/dL — ABNORMAL HIGH (ref 8–23)
CO2: 19 mmol/L — ABNORMAL LOW (ref 22–32)
Calcium: 8.9 mg/dL (ref 8.9–10.3)
Chloride: 100 mmol/L (ref 98–111)
Creatinine, Ser: 1.71 mg/dL — ABNORMAL HIGH (ref 0.61–1.24)
GFR, Estimated: 40 mL/min — ABNORMAL LOW (ref >60)
Glucose, Bld: 162 mg/dL — ABNORMAL HIGH (ref 70–99)
Potassium: 3.1 mmol/L — ABNORMAL LOW (ref 3.5–5.1)
Sodium: 132 mmol/L — ABNORMAL LOW (ref 135–145)
Total Bilirubin: 0.2 mg/dL — ABNORMAL LOW (ref 0.3–1.2)
Total Protein: 6.1 g/dL — ABNORMAL LOW (ref 6.5–8.1)

## 2020-11-30 LAB — SARS CORONAVIRUS 2 BY RT PCR (HOSPITAL ORDER, PERFORMED IN ~~LOC~~ HOSPITAL LAB): SARS Coronavirus 2: NEGATIVE

## 2020-11-30 LAB — PROTIME-INR
INR: 1.7 — ABNORMAL HIGH (ref 0.8–1.2)
Prothrombin Time: 19.1 seconds — ABNORMAL HIGH (ref 11.4–15.2)

## 2020-11-30 LAB — CBC
HCT: 25.7 % — ABNORMAL LOW (ref 39.0–52.0)
Hemoglobin: 8.3 g/dL — ABNORMAL LOW (ref 13.0–17.0)
MCH: 33.3 pg (ref 26.0–34.0)
MCHC: 32.3 g/dL (ref 30.0–36.0)
MCV: 103.2 fL — ABNORMAL HIGH (ref 80.0–100.0)
Platelets: 290 10*3/uL (ref 150–400)
RBC: 2.49 MIL/uL — ABNORMAL LOW (ref 4.22–5.81)
RDW: 13.4 % (ref 11.5–15.5)
WBC: 13.8 10*3/uL — ABNORMAL HIGH (ref 4.0–10.5)
nRBC: 0.2 % (ref 0.0–0.2)

## 2020-11-30 LAB — PHOSPHORUS: Phosphorus: 3.1 mg/dL (ref 2.5–4.6)

## 2020-11-30 LAB — MAGNESIUM: Magnesium: 1.8 mg/dL (ref 1.7–2.4)

## 2020-11-30 LAB — PREPARE RBC (CROSSMATCH)

## 2020-11-30 LAB — HEMOGLOBIN AND HEMATOCRIT, BLOOD
HCT: 25.5 % — ABNORMAL LOW (ref 39.0–52.0)
Hemoglobin: 8.2 g/dL — ABNORMAL LOW (ref 13.0–17.0)

## 2020-11-30 LAB — POC OCCULT BLOOD, ED: Fecal Occult Bld: POSITIVE — AB

## 2020-11-30 MED ORDER — FOLIC ACID 1 MG PO TABS
1.0000 mg | ORAL_TABLET | Freq: Every day | ORAL | Status: DC
  Administered 2020-12-01 – 2020-12-03 (×3): 1 mg via ORAL
  Filled 2020-11-30 (×3): qty 1

## 2020-11-30 MED ORDER — THIAMINE HCL 100 MG/ML IJ SOLN
100.0000 mg | Freq: Every day | INTRAMUSCULAR | Status: DC
  Filled 2020-11-30: qty 2

## 2020-11-30 MED ORDER — LORAZEPAM 2 MG/ML IJ SOLN: 1.0000 mg | INTRAMUSCULAR | Status: DC | PRN

## 2020-11-30 MED ORDER — SODIUM CHLORIDE 0.9 % IV SOLN
10.0000 mL/h | Freq: Once | INTRAVENOUS | Status: AC
  Administered 2020-11-30: 10 mL/h via INTRAVENOUS

## 2020-11-30 MED ORDER — SODIUM CHLORIDE 0.9 % IV BOLUS
1000.0000 mL | Freq: Once | INTRAVENOUS | Status: AC
  Administered 2020-11-30: 1000 mL via INTRAVENOUS

## 2020-11-30 MED ORDER — LORAZEPAM 1 MG PO TABS: 1.0000 mg | ORAL_TABLET | ORAL | Status: DC | PRN

## 2020-11-30 MED ORDER — TRAZODONE HCL 50 MG PO TABS
100.0000 mg | ORAL_TABLET | Freq: Every day | ORAL | Status: DC
  Administered 2020-11-30 – 2020-12-02 (×3): 100 mg via ORAL
  Filled 2020-11-30: qty 2
  Filled 2020-11-30: qty 1
  Filled 2020-11-30: qty 2

## 2020-11-30 MED ORDER — POTASSIUM CHLORIDE CRYS ER 20 MEQ PO TBCR
40.0000 meq | EXTENDED_RELEASE_TABLET | Freq: Once | ORAL | Status: AC
  Administered 2020-11-30: 40 meq via ORAL
  Filled 2020-11-30: qty 2

## 2020-11-30 MED ORDER — PRAVASTATIN SODIUM 40 MG PO TABS
80.0000 mg | ORAL_TABLET | Freq: Every day | ORAL | Status: DC
  Administered 2020-12-01 – 2020-12-03 (×3): 80 mg via ORAL
  Filled 2020-11-30 (×3): qty 2

## 2020-11-30 MED ORDER — ADULT MULTIVITAMIN W/MINERALS CH
1.0000 | ORAL_TABLET | Freq: Every day | ORAL | Status: DC
  Administered 2020-12-01 – 2020-12-03 (×3): 1 via ORAL
  Filled 2020-11-30 (×3): qty 1

## 2020-11-30 MED ORDER — MULTI-VITAMIN/MINERALS PO TABS: 1.0000 | ORAL_TABLET | Freq: Every day | ORAL | Status: DC

## 2020-11-30 MED ORDER — HYDRALAZINE HCL 25 MG PO TABS: 25.0000 mg | ORAL_TABLET | Freq: Four times a day (QID) | ORAL | Status: DC | PRN

## 2020-11-30 MED ORDER — SODIUM CHLORIDE 0.9 % IV SOLN: INTRAVENOUS | Status: AC

## 2020-11-30 MED ORDER — PANTOPRAZOLE SODIUM 40 MG IV SOLR
40.0000 mg | Freq: Two times a day (BID) | INTRAVENOUS | Status: DC
  Administered 2020-11-30 – 2020-12-03 (×6): 40 mg via INTRAVENOUS
  Filled 2020-11-30 (×6): qty 40

## 2020-11-30 MED ORDER — THIAMINE HCL 100 MG PO TABS
100.0000 mg | ORAL_TABLET | Freq: Every day | ORAL | Status: DC
  Administered 2020-11-30 – 2020-12-03 (×4): 100 mg via ORAL
  Filled 2020-11-30 (×4): qty 1

## 2020-11-30 NOTE — ED Triage Notes (Signed)
Pt from home for eval of blood in stool, weakness, back pain and shob x 10-14 days. Denies abdominal pain. On Xarelto for hx of PE.

## 2020-11-30 NOTE — ED Provider Notes (Signed)
[facility] EMERGENCY DEPARTMENT Provider Note   CSN: [id number] Arrival date & time: 11/30/20  1242     History Chief Complaint  Patient presents with  . GI Bleeding    Jason [name] is a 80 y.o. male.  HPI   80 year old with pmhx of PE on AC xarelto, COPD, CAD presents for evaluation of weakness and dark stools. Patient states he has had formed black stools for the past 10 days that yesterday turned to watery, black diarrhea. He has no abdominal pain but feels fatigued and weak. No fever, chest pain, abdominal pain. No vomiting. History of significant alcohol use in the past, admits to heavy drinking about two weeks ago and a about a drink a day since then. No h/o esophageal varices or bleed in the past.   Past Medical History:  Diagnosis Date  . COPD (chronic obstructive pulmonary disease) ([place])   . Hypertension   . Pulmonary embolism [facility])     Patient Active Problem List   Diagnosis Date Noted  . GI bleed 11/30/2020  . History of COVID-19 06/17/2019  . CAD (coronary artery disease) 06/17/2019  . Hyperlipidemia 06/17/2019  . Bilateral pneumonia 06/16/2019  . History of pulmonary embolus (PE) 04/13/2019  . Acute and chronic respiratory failure with hypoxia ([place])   . Pulmonary emboli ([place]) 04/12/2019  . COPD (chronic obstructive pulmonary disease) ([place]) 04/12/2019  . Elevated troponin 04/12/2019  . Pulmonary nodule 04/12/2019  . Former smoker 04/12/2019  . Alcohol abuse 04/12/2019    Past Surgical History:  Procedure Laterality Date  . NO PAST SURGERIES         Family History  Problem Relation Age of Onset  . CVA Mother   . Deep vein thrombosis Mother   . CVA Father     Social History   Tobacco Use  . Smoking status: Former Smoker    Packs/day: 2.00    Years: 45.00    Pack years: 90.00    Types: Cigarettes    Quit date: 06/29/2010    Years since quitting: 10.4  . Smokeless tobacco: Never Used  Substance Use Topics  . Alcohol use:  Not Currently  . Drug use: Never    Home Medications Prior to Admission medications   Medication Sig Start Date End Date Taking? Authorizing Provider  lisinopril-hydrochlorothiazide (ZESTORETIC) 20-25 MG tablet Take 1 tablet by mouth daily. 04/08/19   [provider]  Multiple Vitamins-Minerals (MULTIVITAMIN WITH MINERALS) tablet Take 1 tablet by mouth daily.    [provider]  Nutritional Supplements (JOINT FORMULA PO) Take 1 tablet by mouth daily.    [provider]  potassium chloride SA (K-DUR) 20 MEQ tablet Take 2 tablets (40 mEq total) by mouth daily. 04/14/19   Kayleen Memos, DO  pravastatin (PRAVACHOL) 80 MG tablet Take 80 mg by mouth daily. 04/07/19   [provider]  rivaroxaban (XARELTO) 20 MG TABS tablet Take 20 mg by mouth daily.    [provider]  traZODone (DESYREL) 100 MG tablet Take 100 mg by mouth at bedtime. 03/24/19   [provider]  Vitamin D, Cholecalciferol, 50 MCG (2000 UT) CAPS Take 2,000 Units by mouth daily.    [provider]  Delfin Edis 466-59 MCG/DOSE AEPB  04/27/20   [provider]    Allergies    Patient has no known allergies.  Review of Systems   Review of Systems  Constitutional: Positive for fatigue. Negative for chills and fever.  HENT: Negative for congestion.   Eyes: Negative for visual disturbance.  Respiratory: Negative for shortness of breath.   Cardiovascular: Negative for chest pain.  Gastrointestinal: Positive for diarrhea. Negative for abdominal pain and vomiting.       +black stools  Genitourinary: Negative for dysuria.  Skin: Negative for rash.  Neurological: Negative for headaches.    Physical Exam Updated Vital Signs BP 99/84   Pulse 74   Temp 98 F (36.7 C)   Resp 20   SpO2 98%   Physical Exam Vitals and nursing note reviewed. Exam conducted with a chaperone present.  Constitutional:      Appearance: Normal appearance.  HENT:     Head:  Normocephalic.     Mouth/Throat:     Mouth: Mucous membranes are moist.  Cardiovascular:     Rate and Rhythm: Normal rate.  Pulmonary:     Effort: Pulmonary effort is normal. No respiratory distress.  Abdominal:     Palpations: Abdomen is soft.     Tenderness: There is no abdominal tenderness.  Genitourinary:    Comments: Black stool, fecal occult + Skin:    General: Skin is warm.  Neurological:     Mental Status: He is alert and oriented to person, place, and time. Mental status is at baseline.  Psychiatric:        Mood and Affect: Mood normal.     ED Results / Procedures / Treatments   Labs (all labs ordered are listed, but only abnormal results are displayed) Labs Reviewed  COMPREHENSIVE METABOLIC PANEL - Abnormal; Notable for the following components:      Result Value   Sodium 132 (*)    Potassium 3.1 (*)    CO2 19 (*)    Glucose, Bld 162 (*)    BUN 50 (*)    Creatinine, Ser 1.71 (*)    Total Protein 6.1 (*)    Albumin 3.4 (*)    Alkaline Phosphatase 35 (*)    Total Bilirubin 0.2 (*)    GFR, Estimated 40 (*)    All other components within normal limits  CBC - Abnormal; Notable for the following components:   WBC 13.8 (*)    RBC 2.49 (*)    Hemoglobin 8.3 (*)    HCT 25.7 (*)    MCV 103.2 (*)    All other components within normal limits  POC OCCULT BLOOD, ED - Abnormal; Notable for the following components:   Fecal Occult Bld POSITIVE (*)    All other components within normal limits  SARS CORONAVIRUS 2 BY RT PCR (HOSPITAL ORDER, [facility])  OCCULT BLOOD X 1 CARD TO LAB, STOOL  PROTIME-INR  HEMOGLOBIN AND HEMATOCRIT, BLOOD  MAGNESIUM  PHOSPHORUS  BASIC METABOLIC PANEL  CBC  TYPE AND SCREEN  PREPARE RBC (CROSSMATCH)    EKG EKG Interpretation  Date/Time:  Thursday November 30 2020 13:02:16 EST Ventricular Rate:  102 PR Interval:  162 QRS Duration: 84 QT Interval:  346 QTC Calculation: 450 R Axis:   62 Text  Interpretation: Sinus tachycardia Nonspecific ST and T wave abnormality Abnormal ECG Sinus tachycardia, normal intervals Confirmed by Lavenia Atlas [phone]) on 11/30/2020 3:35:09 PM   Radiology No results found.  Procedures Procedures   Medications Ordered in ED Medications  sodium chloride 0.9 % bolus 1,000 mL (has no administration in time range)  pantoprazole (PROTONIX) injection 40 mg (has no administration in time range)  pravastatin (PRAVACHOL) tablet 80 mg (80 mg Oral  Not Given 11/30/20 1741)  traZODone (DESYREL) tablet 100 mg (has no administration in time range)  0.9 %  sodium chloride infusion (has no administration in time range)  hydrALAZINE (APRESOLINE) tablet 25 mg (has no administration in time range)  LORazepam (ATIVAN) tablet 1-4 mg (has no administration in time range)    Or  LORazepam (ATIVAN) injection 1-4 mg (has no administration in time range)  thiamine tablet 100 mg (100 mg Oral Given 11/30/20 1751)    Or  thiamine (B-1) injection 100 mg ( Intravenous See Alternative 04/25/28 7989)  folic acid (FOLVITE) tablet 1 mg (1 mg Oral Not Given 11/30/20 1752)  multivitamin with minerals tablet 1 tablet (1 tablet Oral Not Given 11/30/20 1752)  0.9 %  sodium chloride infusion (10 mL/hr Intravenous New Bag/Given 11/30/20 1739)  potassium chloride SA (KLOR-CON) CR tablet 40 mEq (40 mEq Oral Given 11/30/20 1751)    ED Course  I have reviewed the triage vital signs and the nursing notes.  Pertinent labs & imaging results that were available during my care of the patient were reviewed by me and considered in my medical decision making (see chart for details).    MDM Rules/Calculators/A&P                          37 male presents for weakness and black stools. AC on xarelto for h/o PE. Hypotensive on arrival but well appearing and conversational, HOH. Abdomen soft. Rectal shows black stool that is hemoccult+. Baseline Hb 13, today Hb 8.3. AKI on blood work. Original IV blew,  difficult access so IV team consulted. Fluids and RBC ordered. GI consult placed, patient admitted to hospitalist. BP improved to >21 systolic, stable at time of admission.   Final Clinical Impression(s) / ED Diagnoses Final diagnoses:  Acute GI bleeding    Rx / DC Orders ED Discharge Orders    None       Lorelle Gibbs, DO 11/30/20 1941

## 2020-11-30 NOTE — ED Triage Notes (Signed)
Emergency Medicine Provider Triage Evaluation Note  Jason [name] , a 80 y.o. male  was evaluated in triage.  Pt complains of dark stools for the past 10 days with weakness.  Patient also states that he is short of breath.  Patient is on Xarelto for prior PE.  Denies any chest pain.  Patient states that he is having some back pain that radiates from his neck down to his lower back.  Unsure when that started.  Denies any falls.  Denies any abdominal pain, fevers, chills, nausea or vomiting.  Denies any abdominal history.   Review of Systems  Positive: Dark stools, shortness of breath Negative: Abdominal pain, nausea or vomiting.  Physical Exam  BP (!) 160/89 (BP Location: Right Arm)   Pulse 86   Temp 98 F (36.7 C) (Oral)   Resp 16   SpO2 98%  Gen:   No acute distress, patient is very pale HEENT:  Atraumatic  Resp:  Some increased work of breathing noted.  Satting at 98% on room air. Cardiac:  Normal rate  Abd:   Nondistended, nontender  MSK:   Moves extremities without difficulty  Neuro:  Speech clear   Medical Decision Making  Medically screening exam initiated at 12:56 PM.  Appropriate orders placed.  Jason [name] was informed that the remainder of the evaluation will be completed by another provider, this initial triage assessment does not replace that evaluation, and the importance of remaining in the ED until their evaluation is complete.  Clinical Impression  80 year old male with dark stools for the past 10 days with shortness of breath.  Patient is on Xarelto.  Patient does appear pale and does have some increased work of breathing, do suspect that hemoglobin is low from GI bleed.  Patient appears stable now, however did tell nursing that patient should be next to be brought back.   Alfredia Client, PA-C 11/30/20 1300

## 2020-11-30 NOTE — ED Notes (Signed)
IV attempted x 2 no success order for IV team placed.

## 2020-11-30 NOTE — H&P (Signed)
History and Physical    Jason [name] MPN:[id number] DOB: 09-Aug-1941 DOA: 11/30/2020  PCP: Lujean Amel, MD (Confirm with patient/family/NH records and if not entered, this has to be entered at [facility] point of entry) Patient coming from: Home  I have personally briefly reviewed patient's old medical records in [place]  Chief Complaint: Bloody diarrhea  HPI: Jason [name] is a 80 y.o. male with medical history significant of pulmonary embolism on Xarelto, HTN, COPD, chronic alcohol abuse, fatty liver, presented with new onset of black tarry stool> bloody diarrhea for 1 week.  Patient lives by himself and started to notice black tarry stool about 1 week ago, denies any abdominal pain no feeling nauseous vomiting.  Over the last 1 week, gradually few more tired and shortness of breath exertional.  Patient has had chronic back pain but denies any abdominal pain.  Yesterday, patient developed bloody diarrhea, he described as blood mixed with liquid stool x1, again no abdominal pain. ED Course: Blood pressure on the low side but responded to IV fluid.  Hemoglobin 8.3 compared to >13 last year.  AKI with creatinine 1.7 compared to 0.8 last year BUN 50.  Review of Systems: As per HPI otherwise 14 point review of systems negative.    Past Medical History:  Diagnosis Date  . COPD (chronic obstructive pulmonary disease) ([place])   . Hypertension   . Pulmonary embolism [facility])     Past Surgical History:  Procedure Laterality Date  . NO PAST SURGERIES       reports that he quit smoking about 10 years ago. His smoking use included cigarettes. He has a 90.00 pack-year smoking history. He has never used smokeless tobacco. He reports previous alcohol use. He reports that he does not use drugs.  No Known Allergies  Family History  Problem Relation Age of Onset  . CVA Mother   . Deep vein thrombosis Mother   . CVA Father      Prior to Admission medications   Medication Sig Start Date End Date  Taking? Authorizing Provider  lisinopril-hydrochlorothiazide (ZESTORETIC) 20-25 MG tablet Take 1 tablet by mouth daily. 04/08/19   [provider]  Multiple Vitamins-Minerals (MULTIVITAMIN WITH MINERALS) tablet Take 1 tablet by mouth daily.    [provider]  Nutritional Supplements (JOINT FORMULA PO) Take 1 tablet by mouth daily.    [provider]  potassium chloride SA (K-DUR) 20 MEQ tablet Take 2 tablets (40 mEq total) by mouth daily. 04/14/19   Kayleen Memos, DO  pravastatin (PRAVACHOL) 80 MG tablet Take 80 mg by mouth daily. 04/07/19   [provider]  rivaroxaban (XARELTO) 20 MG TABS tablet Take 20 mg by mouth daily.    [provider]  traZODone (DESYREL) 100 MG tablet Take 100 mg by mouth at bedtime. 03/24/19   [provider]  Vitamin D, Cholecalciferol, 50 MCG (2000 UT) CAPS Take 2,000 Units by mouth daily.    [provider]  Delfin Edis 250-50 MCG/DOSE AEPB  04/27/20   [provider]    Physical Exam: Vitals:   11/30/20 1500 11/30/20 1515 11/30/20 1545 11/30/20 1630  BP: (!) 83/47 (!) 85/45 (!) 103/50 (!) 94/51  Pulse: 77 76 73 78  Resp: (!) 21 (!) 21 (!) 22 (!) 25  Temp:  98 F (36.7 C)    TempSrc:      SpO2: 99% 97% 99% 97%    Constitutional: NAD, calm, comfortable Vitals:   11/30/20 1500 11/30/20 1515 11/30/20  1545 11/30/20 1630  BP: (!) 83/47 (!) 85/45 (!) 103/50 (!) 94/51  Pulse: 77 76 73 78  Resp: (!) 21 (!) 21 (!) 22 (!) 25  Temp:  98 F (36.7 C)    TempSrc:      SpO2: 99% 97% 99% 97%   Eyes: PERRL, lids and conjunctivae normal ENMT: Mucous membranes are moist. Posterior pharynx clear of any exudate or lesions.Normal dentition.  Neck: normal, supple, no masses, no thyromegaly Respiratory: clear to auscultation bilaterally, no wheezing, no crackles. Normal respiratory effort. No accessory muscle use.  Cardiovascular: Regular rate and rhythm, no murmurs / rubs / gallops. No extremity edema.  2+ pedal pulses. No carotid bruits.  Abdomen: no tenderness, no masses palpated. No hepatosplenomegaly. Bowel sounds positive.  Musculoskeletal: no clubbing / cyanosis. No joint deformity upper and lower extremities. Good ROM, no contractures. Normal muscle tone.  Skin: no rashes, lesions, ulcers. No induration Neurologic: CN 2-12 grossly intact. Sensation intact, DTR normal. Strength 5/5 in all 4.  Psychiatric: Normal judgment and insight. Alert and oriented x 3. Normal mood.    Labs on Admission: I have personally reviewed following labs and imaging studies  CBC: Recent Labs  Lab 11/30/20 1302  WBC 13.8*  HGB 8.3*  HCT 25.7*  MCV 103.2*  PLT 761   Basic Metabolic Panel: Recent Labs  Lab 11/30/20 1302  NA 132*  K 3.1*  CL 100  CO2 19*  GLUCOSE 162*  BUN 50*  CREATININE 1.71*  CALCIUM 8.9   GFR: CrCl cannot be calculated (Unknown ideal weight.). Liver Function Tests: Recent Labs  Lab 11/30/20 1302  AST 23  ALT 23  ALKPHOS 35*  BILITOT 0.2*  PROT 6.1*  ALBUMIN 3.4*   No results for input(s): LIPASE, AMYLASE in the last 168 hours. No results for input(s): AMMONIA in the last 168 hours. Coagulation Profile: No results for input(s): INR, PROTIME in the last 168 hours. Cardiac Enzymes: No results for input(s): CKTOTAL, CKMB, CKMBINDEX, TROPONINI in the last 168 hours. BNP (last 3 results) No results for input(s): PROBNP in the last 8760 hours. HbA1C: No results for input(s): HGBA1C in the last 72 hours. CBG: No results for input(s): GLUCAP in the last 168 hours. Lipid Profile: No results for input(s): CHOL, HDL, LDLCALC, TRIG, CHOLHDL, LDLDIRECT in the last 72 hours. Thyroid Function Tests: No results for input(s): TSH, T4TOTAL, FREET4, T3FREE, THYROIDAB in the last 72 hours. Anemia Panel: No results for input(s): VITAMINB12, FOLATE, FERRITIN, TIBC, IRON, RETICCTPCT in the last 72 hours. Urine analysis:    Component Value Date/Time   COLORURINE YELLOW  05/31/2019 2218   APPEARANCEUR CLEAR 05/31/2019 2218   LABSPEC >1.030 (H) 05/31/2019 2218   PHURINE 5.5 05/31/2019 2218   GLUCOSEU NEGATIVE 05/31/2019 2218   HGBUR SMALL (A) 05/31/2019 2218   BILIRUBINUR MODERATE (A) 05/31/2019 2218   KETONESUR 40 (A) 05/31/2019 2218   PROTEINUR 100 (A) 05/31/2019 2218   NITRITE NEGATIVE 05/31/2019 2218   LEUKOCYTESUR NEGATIVE 05/31/2019 2218    Radiological Exams on Admission: No results found.  EKG: Independently reviewed.  Sinus, nonspecific ST changes  Assessment/Plan Active Problems:   GI bleed  (please populate well all problems here in Problem List. (For example, if patient is on BP meds at home and you resume or decide to hold them, it is a problem that needs to be her. Same for CAD, COPD, HLD and so on)  Acute blood loss anemia, symptomatic, secondary to GI bleed -Suspect upper GI bleed.  Most likely peptic ulcer.  History of alcohol abuse but last years CT chest showed stenosis but no cirrhosis or varices.  Albumin level normal, INR pending.   -No history of nauseous vomit, unlikely Mallory-Weiss. -Hold Sandostatin, GI was consulted. -PRBC x1 -PPI twice daily -Repeat H&H after transfusion  Impending hemorrhagic shock -Responded to IV fluids, continue IVF 125 -Admit to PCU -Hold BP meds  AKI -Likely prerenal from GI loss -IV fluids and recheck BMP tomorrow  History of alcohol abuse -According to patient his last drink was more than 1 week ago -No symptoms signs of acute withdrawal, CIWA protocol with as needed benzos  HTN -Hold BP meds  History of PE -Blood pressure stabilized on IV fluid, last bloody diarrhea was more than 12 hours ago. -Discontinue Xarelto, consider reverse if patient becomes hemodynamically unstable.  DVT prophylaxis: SCD Code Status: Discussed with patient and his son at bedside, patient was changed from full code to DNR last year when he had Covid.  As of now, both patient and family member prefer  patient remain full code Family Communication: Son at bedside Disposition Plan: Expect more than 2 midnight hospital stay for GI work-up. Consults called: [facility] Admission status: PCU   Lequita Halt MD [facility] Pager [phone]  11/30/2020, 5:11 PM

## 2020-12-01 DIAGNOSIS — K922 Gastrointestinal hemorrhage, unspecified: Secondary | ICD-10-CM | POA: Diagnosis not present

## 2020-12-01 LAB — BASIC METABOLIC PANEL
Anion gap: 11 (ref 5–15)
BUN: 42 mg/dL — ABNORMAL HIGH (ref 8–23)
CO2: 18 mmol/L — ABNORMAL LOW (ref 22–32)
Calcium: 8.3 mg/dL — ABNORMAL LOW (ref 8.9–10.3)
Chloride: 107 mmol/L (ref 98–111)
Creatinine, Ser: 1.09 mg/dL (ref 0.61–1.24)
GFR, Estimated: >60 mL/min (ref >60)
Glucose, Bld: 107 mg/dL — ABNORMAL HIGH (ref 70–99)
Potassium: 3.3 mmol/L — ABNORMAL LOW (ref 3.5–5.1)
Sodium: 136 mmol/L (ref 135–145)

## 2020-12-01 LAB — CBC
HCT: 25.2 % — ABNORMAL LOW (ref 39.0–52.0)
Hemoglobin: 8.1 g/dL — ABNORMAL LOW (ref 13.0–17.0)
MCH: 30.9 pg (ref 26.0–34.0)
MCHC: 32.1 g/dL (ref 30.0–36.0)
MCV: 96.2 fL (ref 80.0–100.0)
Platelets: 209 10*3/uL (ref 150–400)
RBC: 2.62 MIL/uL — ABNORMAL LOW (ref 4.22–5.81)
RDW: 18.6 % — ABNORMAL HIGH (ref 11.5–15.5)
WBC: 10.4 10*3/uL (ref 4.0–10.5)
nRBC: 0.2 % (ref 0.0–0.2)

## 2020-12-01 LAB — HEMOGLOBIN AND HEMATOCRIT, BLOOD
HCT: 21.7 % — ABNORMAL LOW (ref 39.0–52.0)
Hemoglobin: 6.9 g/dL — CL (ref 13.0–17.0)

## 2020-12-01 LAB — PREPARE RBC (CROSSMATCH)

## 2020-12-01 MED ORDER — FUROSEMIDE 10 MG/ML IJ SOLN
20.0000 mg | Freq: Once | INTRAMUSCULAR | Status: AC | PRN
  Administered 2020-12-01: 20 mg via INTRAVENOUS
  Filled 2020-12-01: qty 2

## 2020-12-01 MED ORDER — SODIUM CHLORIDE 0.9% IV SOLUTION: Freq: Once | INTRAVENOUS | Status: DC

## 2020-12-01 MED ORDER — POTASSIUM CHLORIDE 10 MEQ/100ML IV SOLN
10.0000 meq | INTRAVENOUS | Status: AC
  Administered 2020-12-01 (×2): 10 meq via INTRAVENOUS
  Filled 2020-12-01 (×2): qty 100

## 2020-12-01 MED ORDER — SODIUM CHLORIDE 0.9 % IV SOLN: INTRAVENOUS | Status: DC

## 2020-12-01 NOTE — Consult Note (Signed)
Referring Provider:  [place] Primary Care Physician:  Lujean Amel, MD Primary Gastroenterologist: [facility] Primary  Reason for Consultation: GI bleed  HPI: Jason [name] is a 80 y.o. male with past medical history of COPD/interstitial lung disease, history of COVID-19 infection in August 2020, and pulmonary embolism on Xarelto presented to the hospital with weakness.  Was complaining of left-sided stools for last 10 days.  Upon initial evaluation in the emergency room, was found to hemoglobin of 8.3 with prior normal hemoglobin last year.  Was also found to have mild acute kidney injury.  GI is consulted for further evaluation.  Patient seen and examined at bedside in emergency room.  Patient is very hard of hearing and history taking was difficult.  He started noticing black-colored stool around 7 to 10 days ago.  Was also feeling weakness.  Denied any associated abdominal pain, nausea or vomiting.  Had some diarrhea a few days ago which has resolved now.  Denies any previous episodes of bleeding.  Last dose of Xarelto was yesterday morning.    Past Medical History:  Diagnosis Date  . COPD (chronic obstructive pulmonary disease) ([place])   . Hypertension   . Pulmonary embolism [facility])     Past Surgical History:  Procedure Laterality Date  . NO PAST SURGERIES      Prior to Admission medications   Medication Sig Start Date End Date Taking? Authorizing Provider  lisinopril-hydrochlorothiazide (ZESTORETIC) 20-25 MG tablet Take 1 tablet by mouth daily. 04/08/19   [provider]  Multiple Vitamins-Minerals (MULTIVITAMIN WITH MINERALS) tablet Take 1 tablet by mouth daily.    [provider]  Nutritional Supplements (JOINT FORMULA PO) Take 1 tablet by mouth daily.    [provider]  potassium chloride SA (K-DUR) 20 MEQ tablet Take 2 tablets (40 mEq total) by mouth daily. 04/14/19   Kayleen Memos, DO  pravastatin (PRAVACHOL) 80 MG tablet Take 80 mg by mouth daily. 04/07/19    [provider]  rivaroxaban (XARELTO) 20 MG TABS tablet Take 20 mg by mouth daily.    [provider]  traZODone (DESYREL) 100 MG tablet Take 100 mg by mouth at bedtime. 03/24/19   [provider]  Vitamin D, Cholecalciferol, 50 MCG (2000 UT) CAPS Take 2,000 Units by mouth daily.    [provider]  Delfin Edis 034-74 MCG/DOSE AEPB  04/27/20   [provider]    Scheduled Meds: . folic acid  1 mg Oral Daily  . multivitamin with minerals  1 tablet Oral Daily  . pantoprazole (PROTONIX) IV  40 mg Intravenous Q12H  . pravastatin  80 mg Oral Daily  . thiamine  100 mg Oral Daily   Or  . thiamine  100 mg Intravenous Daily  . traZODone  100 mg Oral QHS   Continuous Infusions: . sodium chloride 125 mL/hr at 11/30/20 2238   PRN Meds:.hydrALAZINE, LORazepam **OR** LORazepam  Allergies as of 11/30/2020  . (No Known Allergies)    Family History  Problem Relation Age of Onset  . CVA Mother   . Deep vein thrombosis Mother   . CVA Father     Social History   Socioeconomic History  . Marital status: Single    Spouse name: Not on file  . Number of children: Not on file  . Years of education: Not on file  . Highest education level: Not on file  Occupational History  . Not on file  Tobacco Use  . Smoking status: Former Smoker  Packs/day: 2.00    Years: 45.00    Pack years: 90.00    Types: Cigarettes    Quit date: 06/29/2010    Years since quitting: 10.4  . Smokeless tobacco: Never Used  Substance and Sexual Activity  . Alcohol use: Not Currently  . Drug use: Never  . Sexual activity: Not on file  Other Topics Concern  . Not on file  Social History Narrative  . Not on file   Social Determinants of Health   Financial Resource Strain: Not on file  Food Insecurity: Not on file  Transportation Needs: Not on file  Physical Activity: Not on file  Stress: Not on file  Social Connections: Not on file  Intimate Partner Violence: Not  on file    Review of Systems: All negative except as stated above in HPI.  Physical Exam: Vital signs: Vitals:   12/01/20 0515 12/01/20 0530  BP: (!) 113/54 (!) 106/59  Pulse: 68 73  Resp: 20 (!) 21  Temp:    SpO2: 99% 96%   Physical Exam Constitutional:      Appearance: Normal appearance.  HENT:     Head: Normocephalic and atraumatic.     Nose: Nose normal.  Eyes:     Extraocular Movements: Extraocular movements intact.  Cardiovascular:     Rate and Rhythm: Normal rate and regular rhythm.     Heart sounds: No murmur heard.   Pulmonary:     Effort: Pulmonary effort is normal. No respiratory distress.  Abdominal:     General: Abdomen is flat. Bowel sounds are normal. There is no distension.     Tenderness: There is no abdominal tenderness. There is no guarding.  Musculoskeletal:        General: Normal range of motion.     Cervical back: Normal range of motion and neck supple.     Right lower leg: No edema.     Left lower leg: No edema.  Skin:    General: Skin is warm.     Coloration: Skin is not jaundiced.  Neurological:     Mental Status: He is alert and oriented to person, place, and time.  Psychiatric:        Mood and Affect: Mood normal.        Thought Content: Thought content normal.        Judgment: Judgment normal.    GI:  Lab Results: Recent Labs    11/30/20 1302 11/30/20 2248 12/01/20 0430  WBC 13.8*  --  10.4  HGB 8.3* 8.2* 8.1*  HCT 25.7* 25.5* 25.2*  PLT 290  --  209   BMET Recent Labs    11/30/20 1302 12/01/20 0430  NA 132* 136  K 3.1* 3.3*  CL 100 107  CO2 19* 18*  GLUCOSE 162* 107*  BUN 50* 42*  CREATININE 1.71* 1.09  CALCIUM 8.9 8.3*   LFT Recent Labs    11/30/20 1302  PROT 6.1*  ALBUMIN 3.4*  AST 23  ALT 23  ALKPHOS 35*  BILITOT 0.2*   PT/INR Recent Labs    11/30/20 2248  LABPROT 19.1*  INR 1.7*     Studies/Results: No results found.  Impression/Plan: -GI bleed with melena.  Patient with elevated BUN.   Most likely upper GI bleed.  He was probably hemoconcentrated on admission.  Hemoglobin remained same after 1 unit of blood transfusion because of prior hemoconcentration. -Acute kidney injury.  Improving -History of pulmonary embolism.  Last dose of Xarelto yesterday morning -Hard  of hearing  Recommendations ------------------------ -Continue supportive care for now -Continue IV twice daily PPI.  Monitor H&H.  Transfuse if hemoglobin less than 8. -Hold Xarelto.  Xarelto may take around  48 hours for clearance because of his acute kidney injury. -Plan for EGD tomorrow -Okay to have clear liquid diet today.  Keep n.p.o. past midnight. D/W Dr. Therisa Doyne  Risks (bleeding, infection, bowel perforation that could require surgery, sedation-related changes in cardiopulmonary systems), benefits (identification and possible treatment of source of symptoms, exclusion of certain causes of symptoms), and alternatives (watchful waiting, radiographic imaging studies, empiric medical treatment)  were explained to patient in detail and patient wishes to proceed.    LOS: 1 day   Otis Brace  MD, FACP 12/01/2020, 8:33 AM  Contact #  [phone]

## 2020-12-01 NOTE — ED Notes (Signed)
Paged placed to attending regarding slight drop in Hgb after 1 unit of blood transfused.

## 2020-12-01 NOTE — ED Notes (Signed)
Received call back from Antonieta Pert MD. 2 units of blood is being ordered for Hgb of 6.9 and lasix in between for shob. Notified primary RN [employer].

## 2020-12-01 NOTE — ED Notes (Addendum)
Critical Result: Hgb 6.9. Paged Antonieta Pert MD and notified primary RN

## 2020-12-01 NOTE — Progress Notes (Signed)
PROGRESS NOTE    Jason [name]  [id number] DOB: August 30, 1941 DOA: 11/30/2020 PCP: Lujean Amel, MD   Chief Complaint  Patient presents with  . GI Bleeding  Brief Narrative: 80 year old male with history of pulmonary embolism on Xarelto, hypertension, COPD, chronic alcohol abuse, fatty liver presented with black tarry stool, then bloody diarrhea x1 week without abdominal pain nausea or vomiting and feeling gradually more tired and short of breath with exertion.  Has had chronic back pain.Seen in the ED with Hemoccult positive stool, hemoglobin 8.3 baseline was more than 13 g last year also with AKI creatinine 1.7.  GI was consulted and patient was admitted. Received 1 unit PRBC overnight 1/27  Subjective: Seen this morning in the ED.  He is very hard of hearing. Overnight H&H at 8.1 g despite 1 unit PRBC. Last Xarelto use was yesterday morning.  Assessment & Plan:  GI bleed with black tarry stool x 1 wk then subsequently bloody diarrhea: Continue n.p.o., serial H&H, PPI twice daily.  GI consulted for further recommendation.  Transfuse as needed.  Acute blood loss anemia Baseline hemoglobin 10 g past 1 years, now around 8 g monitor closely status post 1 unit PRBC.  Transfuse if further drop Recent Labs  Lab 11/30/20 1302 11/30/20 2248 12/01/20 0430  HGB 8.3* 8.2* 8.1*  HCT 25.7* 25.5* 25.2*   AKI likely from prerenal volume depletion and GI bleed.  Resolved.  Monitor Recent Labs  Lab 11/30/20 1302 12/01/20 0430  BUN 50* 42*  CREATININE 1.71* 1.09   History of COVID-19 infection last year history of PE on Xarelto which is on hold,Last Xarelto use was yesterday morning.  Hypokalemia mild we will replete.  Hypertension: Blood pressure remains stable will hold his meds due to GI bleed  COPD-not in nexacerbation  Chronic alcohol abuse/fatty liver- monitor lft, etoh cessation counselling.  Patient reports he has cut down his ( liquor) alcohol intake significantly since  January now only drinking 5% alcohol every other day  Nutrition: Diet Order            Diet NPO time specified Except for: Sips with Meds  Diet effective now                 There is no height or weight on file to calculate BMI.  DVT prophylaxis: SCDs Start: 11/30/20 1650 Code Status:   Code Status: Full Code  Family Communication: plan of care discussed with patient at bedside.  Status is: Inpatient Remains inpatient appropriate because:IV treatments appropriate due to intensity of illness or inability to take PO and For ongoing acute anemia and GI blood loss work-up and management  Dispo: The patient is from: Home              Anticipated d/c is to: Home              Anticipated d/c date is: 2 days              Patient currently is not medically stable to d/c.   Difficult to place patient No   Consultants:see note  Procedures:see note  Culture/Microbiology    Component Value Date/Time   SDES BLOOD LEFT ANTECUBITAL 06/16/2019 1505   SPECREQUEST  06/16/2019 1505    BOTTLES DRAWN AEROBIC AND ANAEROBIC Blood Culture adequate volume   CULT  06/16/2019 1505    NO GROWTH 5 DAYS Performed at [facility], [place] [address] [place], [place] [address]    REPTSTATUS 06/21/2019 FINAL 06/16/2019  1505    Other culture-see note  Medications: Scheduled Meds: . folic acid  1 mg Oral Daily  . multivitamin with minerals  1 tablet Oral Daily  . pantoprazole (PROTONIX) IV  40 mg Intravenous Q12H  . pravastatin  80 mg Oral Daily  . thiamine  100 mg Oral Daily   Or  . thiamine  100 mg Intravenous Daily  . traZODone  100 mg Oral QHS   Continuous Infusions: . sodium chloride 125 mL/hr at 11/30/20 2238    Antimicrobials: Anti-infectives (From admission, onward)   None     Objective: Vitals: Today's Vitals   12/01/20 0445 12/01/20 0500 12/01/20 0515 12/01/20 0530  BP: 114/65 (!) 103/52 (!) 113/54 (!) 106/59  Pulse: 72 67 68 73  Resp: (!) 23 (!) 22 20 (!) 21  Temp:       TempSrc:      SpO2: 100% 97% 99% 96%  PainSc:        Intake/Output Summary (Last 24 hours) at 12/01/2020 0825 Last data filed at 11/30/2020 2224 Gross per 24 hour  Intake 1535 ml  Output -  Net 1535 ml   There were no vitals filed for this visit. Weight change:   Intake/Output from previous day: 01/27 0701 - 01/28 0700 In: 1535 [I.V.:200; Blood:335; IV Piggyback:1000] Out: -  Intake/Output this shift: No intake/output data recorded. There were no vitals filed for this visit.  Examination: General exam: AAOx3NAD,hard of hearing, weak appearing. HEENT:Oral mucosa moist, Ear/Nose WNL grossly,dentition normal. Respiratory system: bilaterally clear,no wheezing or crackles,no use of accessory muscle, non tender. Cardiovascular system: S1 & S2 +, regular, No JVD. Gastrointestinal system: Abdomen soft, NT,ND, BS+. Nervous System:Alert, awake, moving extremities and grossly nonfocal Extremities: No edema, distal peripheral pulses palpable.  Skin: No rashes,no icterus. MSK: Normal muscle bulk,tone, power   Data Reviewed: I have personally reviewed following labs and imaging studies CBC: Recent Labs  Lab 11/30/20 1302 11/30/20 2248 12/01/20 0430  WBC 13.8*  --  10.4  HGB 8.3* 8.2* 8.1*  HCT 25.7* 25.5* 25.2*  MCV 103.2*  --  96.2  PLT 290  --  527   Basic Metabolic Panel: Recent Labs  Lab 11/30/20 1302 11/30/20 2248 12/01/20 0430  NA 132*  --  136  K 3.1*  --  3.3*  CL 100  --  107  CO2 19*  --  18*  GLUCOSE 162*  --  107*  BUN 50*  --  42*  CREATININE 1.71*  --  1.09  CALCIUM 8.9  --  8.3*  MG  --  1.8  --   PHOS  --  3.1  --    GFR: CrCl cannot be calculated (Unknown ideal weight.). Liver Function Tests: Recent Labs  Lab 11/30/20 1302  AST 23  ALT 23  ALKPHOS 35*  BILITOT 0.2*  PROT 6.1*  ALBUMIN 3.4*   No results for input(s): LIPASE, AMYLASE in the last 168 hours. No results for input(s): AMMONIA in the last 168 hours. Coagulation  Profile: Recent Labs  Lab 11/30/20 2248  INR 1.7*   Cardiac Enzymes: No results for input(s): CKTOTAL, CKMB, CKMBINDEX, TROPONINI in the last 168 hours. BNP (last 3 results) No results for input(s): PROBNP in the last 8760 hours. HbA1C: No results for input(s): HGBA1C in the last 72 hours. CBG: No results for input(s): GLUCAP in the last 168 hours. Lipid Profile: No results for input(s): CHOL, HDL, LDLCALC, TRIG, CHOLHDL, LDLDIRECT in the last 72 hours. Thyroid Function  Tests: No results for input(s): TSH, T4TOTAL, FREET4, T3FREE, THYROIDAB in the last 72 hours. Anemia Panel: No results for input(s): VITAMINB12, FOLATE, FERRITIN, TIBC, IRON, RETICCTPCT in the last 72 hours. Sepsis Labs: No results for input(s): PROCALCITON, LATICACIDVEN in the last 168 hours.  Recent Results (from the past 240 hour(s))  SARS Coronavirus 2 by RT PCR (hospital order, performed in [facility] hospital lab) Nasopharyngeal Nasopharyngeal Swab     Status: None   Collection Time: 11/30/20  4:24 PM   Specimen: Nasopharyngeal Swab  Result Value Ref Range Status   SARS Coronavirus 2 NEGATIVE NEGATIVE Final    Comment: (NOTE) SARS-CoV-2 target nucleic acids are NOT DETECTED.  The SARS-CoV-2 RNA is generally detectable in upper and lower respiratory specimens during the acute phase of infection. The lowest concentration of SARS-CoV-2 viral copies this assay can detect is 250 copies / mL. A negative result does not preclude SARS-CoV-2 infection and should not be used as the sole basis for treatment or other patient management decisions.  A negative result may occur with improper specimen collection / handling, submission of specimen other than nasopharyngeal swab, presence of viral mutation(s) within the areas targeted by this assay, and inadequate number of viral copies (<250 copies / mL). A negative result must be combined with clinical observations, patient history, and epidemiological  information.  Fact Sheet for Patients:   StrictlyIdeas.no  Fact Sheet for Healthcare Providers: BankingDealers.co.za  This test is not yet approved or  cleared by the [place] FDA and has been authorized for detection and/or diagnosis of SARS-CoV-2 by FDA under an Emergency Use Authorization (EUA).  This EUA will remain in effect (meaning this test can be used) for the duration of the COVID-19 declaration under Section 564(b)(1) of the Act, 21 U.S.C. section 360bbb-3(b)(1), unless the authorization is terminated or revoked sooner.  Performed at [facility], [place] [address], [place], [place] [address]      Radiology Studies: No results found.   LOS: 1 day   Antonieta Pert, MD [facility]  12/01/2020, 8:25 AM

## 2020-12-01 NOTE — ED Notes (Signed)
Dinner Trays Ordered @ 1635. 

## 2020-12-01 NOTE — H&P (View-Only) (Signed)
Referring Provider:  [place] Primary Care Physician:  Lujean Amel, MD Primary Gastroenterologist: Sadie [facility] Primary  Reason for Consultation: GI bleed  HPI: Jason [name] is a 80 y.o. male with past medical history of COPD/interstitial lung disease, history of COVID-19 infection in August 2020, and pulmonary embolism on Xarelto presented to the hospital with weakness.  Was complaining of left-sided stools for last 10 days.  Upon initial evaluation in the emergency room, was found to hemoglobin of 8.3 with prior normal hemoglobin last year.  Was also found to have mild acute kidney injury.  GI is consulted for further evaluation.  Patient seen and examined at bedside in emergency room.  Patient is very hard of hearing and history taking was difficult.  He started noticing black-colored stool around 7 to 10 days ago.  Was also feeling weakness.  Denied any associated abdominal pain, nausea or vomiting.  Had some diarrhea a few days ago which has resolved now.  Denies any previous episodes of bleeding.  Last dose of Xarelto was yesterday morning.    Past Medical History:  Diagnosis Date  . COPD (chronic obstructive pulmonary disease) ([place])   . Hypertension   . Pulmonary embolism [facility])     Past Surgical History:  Procedure Laterality Date  . NO PAST SURGERIES      Prior to Admission medications   Medication Sig Start Date End Date Taking? Authorizing Provider  lisinopril-hydrochlorothiazide (ZESTORETIC) 20-25 MG tablet Take 1 tablet by mouth daily. 04/08/19   [provider]  Multiple Vitamins-Minerals (MULTIVITAMIN WITH MINERALS) tablet Take 1 tablet by mouth daily.    [provider]  Nutritional Supplements (JOINT FORMULA PO) Take 1 tablet by mouth daily.    [provider]  potassium chloride SA (K-DUR) 20 MEQ tablet Take 2 tablets (40 mEq total) by mouth daily. 04/14/19   Kayleen Memos, DO  pravastatin (PRAVACHOL) 80 MG tablet Take 80 mg by mouth daily. 04/07/19    [provider]  rivaroxaban (XARELTO) 20 MG TABS tablet Take 20 mg by mouth daily.    [provider]  traZODone (DESYREL) 100 MG tablet Take 100 mg by mouth at bedtime. 03/24/19   [provider]  Vitamin D, Cholecalciferol, 50 MCG (2000 UT) CAPS Take 2,000 Units by mouth daily.    [provider]  Delfin Edis 034-74 MCG/DOSE AEPB  04/27/20   [provider]    Scheduled Meds: . folic acid  1 mg Oral Daily  . multivitamin with minerals  1 tablet Oral Daily  . pantoprazole (PROTONIX) IV  40 mg Intravenous Q12H  . pravastatin  80 mg Oral Daily  . thiamine  100 mg Oral Daily   Or  . thiamine  100 mg Intravenous Daily  . traZODone  100 mg Oral QHS   Continuous Infusions: . sodium chloride 125 mL/hr at 11/30/20 2238   PRN Meds:.hydrALAZINE, LORazepam **OR** LORazepam  Allergies as of 11/30/2020  . (No Known Allergies)    Family History  Problem Relation Age of Onset  . CVA Mother   . Deep vein thrombosis Mother   . CVA Father     Social History   Socioeconomic History  . Marital status: Single    Spouse name: Not on file  . Number of children: Not on file  . Years of education: Not on file  . Highest education level: Not on file  Occupational History  . Not on file  Tobacco Use  . Smoking status: Former Smoker  Packs/day: 2.00    Years: 45.00    Pack years: 90.00    Types: Cigarettes    Quit date: 06/29/2010    Years since quitting: 10.4  . Smokeless tobacco: Never Used  Substance and Sexual Activity  . Alcohol use: Not Currently  . Drug use: Never  . Sexual activity: Not on file  Other Topics Concern  . Not on file  Social History Narrative  . Not on file   Social Determinants of Health   Financial Resource Strain: Not on file  Food Insecurity: Not on file  Transportation Needs: Not on file  Physical Activity: Not on file  Stress: Not on file  Social Connections: Not on file  Intimate Partner Violence: Not  on file    Review of Systems: All negative except as stated above in HPI.  Physical Exam: Vital signs: Vitals:   12/01/20 0515 12/01/20 0530  BP: (!) 113/54 (!) 106/59  Pulse: 68 73  Resp: 20 (!) 21  Temp:    SpO2: 99% 96%   Physical Exam Constitutional:      Appearance: Normal appearance.  HENT:     Head: Normocephalic and atraumatic.     Nose: Nose normal.  Eyes:     Extraocular Movements: Extraocular movements intact.  Cardiovascular:     Rate and Rhythm: Normal rate and regular rhythm.     Heart sounds: No murmur heard.   Pulmonary:     Effort: Pulmonary effort is normal. No respiratory distress.  Abdominal:     General: Abdomen is flat. Bowel sounds are normal. There is no distension.     Tenderness: There is no abdominal tenderness. There is no guarding.  Musculoskeletal:        General: Normal range of motion.     Cervical back: Normal range of motion and neck supple.     Right lower leg: No edema.     Left lower leg: No edema.  Skin:    General: Skin is warm.     Coloration: Skin is not jaundiced.  Neurological:     Mental Status: He is alert and oriented to person, place, and time.  Psychiatric:        Mood and Affect: Mood normal.        Thought Content: Thought content normal.        Judgment: Judgment normal.    GI:  Lab Results: Recent Labs    11/30/20 1302 11/30/20 2248 12/01/20 0430  WBC 13.8*  --  10.4  HGB 8.3* 8.2* 8.1*  HCT 25.7* 25.5* 25.2*  PLT 290  --  209   BMET Recent Labs    11/30/20 1302 12/01/20 0430  NA 132* 136  K 3.1* 3.3*  CL 100 107  CO2 19* 18*  GLUCOSE 162* 107*  BUN 50* 42*  CREATININE 1.71* 1.09  CALCIUM 8.9 8.3*   LFT Recent Labs    11/30/20 1302  PROT 6.1*  ALBUMIN 3.4*  AST 23  ALT 23  ALKPHOS 35*  BILITOT 0.2*   PT/INR Recent Labs    11/30/20 2248  LABPROT 19.1*  INR 1.7*     Studies/Results: No results found.  Impression/Plan: -GI bleed with melena.  Patient with elevated BUN.   Most likely upper GI bleed.  He was probably hemoconcentrated on admission.  Hemoglobin remained same after 1 unit of blood transfusion because of prior hemoconcentration. -Acute kidney injury.  Improving -History of pulmonary embolism.  Last dose of Xarelto yesterday morning -Hard  of hearing  Recommendations ------------------------ -Continue supportive care for now -Continue IV twice daily PPI.  Monitor H&H.  Transfuse if hemoglobin less than 8. -Hold Xarelto.  Xarelto may take around  48 hours for clearance because of his acute kidney injury. -Plan for EGD tomorrow -Okay to have clear liquid diet today.  Keep n.p.o. past midnight. D/W Dr. Therisa Doyne  Risks (bleeding, infection, bowel perforation that could require surgery, sedation-related changes in cardiopulmonary systems), benefits (identification and possible treatment of source of symptoms, exclusion of certain causes of symptoms), and alternatives (watchful waiting, radiographic imaging studies, empiric medical treatment)  were explained to patient in detail and patient wishes to proceed.    LOS: 1 day   Otis Brace  MD, FACP 12/01/2020, 8:33 AM  Contact #  [phone]

## 2020-12-02 ENCOUNTER — Encounter (HOSPITAL_COMMUNITY): Payer: Self-pay | Admitting: Internal Medicine

## 2020-12-02 ENCOUNTER — Inpatient Hospital Stay (HOSPITAL_COMMUNITY): Payer: Medicare HMO | Admitting: Certified Registered Nurse Anesthetist

## 2020-12-02 ENCOUNTER — Encounter (HOSPITAL_COMMUNITY)
Admission: EM | Disposition: A | Discharge status: Home / Self Care | Payer: Self-pay | Source: Home / Self Care | Attending: Internal Medicine

## 2020-12-02 DIAGNOSIS — K922 Gastrointestinal hemorrhage, unspecified: Secondary | ICD-10-CM | POA: Diagnosis not present

## 2020-12-02 HISTORY — PX: ESOPHAGOGASTRODUODENOSCOPY (EGD) WITH PROPOFOL: SHX5813

## 2020-12-02 LAB — BASIC METABOLIC PANEL
Anion gap: 9 (ref 5–15)
BUN: 23 mg/dL (ref 8–23)
CO2: 22 mmol/L (ref 22–32)
Calcium: 8.3 mg/dL — ABNORMAL LOW (ref 8.9–10.3)
Chloride: 107 mmol/L (ref 98–111)
Creatinine, Ser: 1.07 mg/dL (ref 0.61–1.24)
GFR, Estimated: >60 mL/min (ref >60)
Glucose, Bld: 113 mg/dL — ABNORMAL HIGH (ref 70–99)
Potassium: 3.3 mmol/L — ABNORMAL LOW (ref 3.5–5.1)
Sodium: 138 mmol/L (ref 135–145)

## 2020-12-02 LAB — MRSA PCR SCREENING: MRSA by PCR: NEGATIVE

## 2020-12-02 LAB — BPAM RBC
Blood Product Expiration Date: 202202212359
Blood Product Expiration Date: 202202212359
Blood Product Expiration Date: 202202282359
ISSUE DATE / TIME: 202201271729
ISSUE DATE / TIME: 202201281733
ISSUE DATE / TIME: 202201282032
Unit Type and Rh: 6200
Unit Type and Rh: 6200
Unit Type and Rh: 6200

## 2020-12-02 LAB — TYPE AND SCREEN
ABO/RH(D): A POS
Antibody Screen: NEGATIVE
Unit division: 0
Unit division: 0
Unit division: 0

## 2020-12-02 LAB — HEMOGLOBIN AND HEMATOCRIT, BLOOD
HCT: 29.1 % — ABNORMAL LOW (ref 39.0–52.0)
HCT: 29.7 % — ABNORMAL LOW (ref 39.0–52.0)
Hemoglobin: 9.6 g/dL — ABNORMAL LOW (ref 13.0–17.0)
Hemoglobin: 9.7 g/dL — ABNORMAL LOW (ref 13.0–17.0)

## 2020-12-02 LAB — CBC
HCT: 29.2 % — ABNORMAL LOW (ref 39.0–52.0)
Hemoglobin: 9.7 g/dL — ABNORMAL LOW (ref 13.0–17.0)
MCH: 30.9 pg (ref 26.0–34.0)
MCHC: 33.2 g/dL (ref 30.0–36.0)
MCV: 93 fL (ref 80.0–100.0)
Platelets: 219 10*3/uL (ref 150–400)
RBC: 3.14 MIL/uL — ABNORMAL LOW (ref 4.22–5.81)
RDW: 19 % — ABNORMAL HIGH (ref 11.5–15.5)
WBC: 8.2 10*3/uL (ref 4.0–10.5)
nRBC: 0.2 % (ref 0.0–0.2)

## 2020-12-02 SURGERY — ESOPHAGOGASTRODUODENOSCOPY (EGD) WITH PROPOFOL
Anesthesia: Monitor Anesthesia Care

## 2020-12-02 MED ORDER — PROPOFOL 10 MG/ML IV BOLUS
INTRAVENOUS | Status: DC | PRN
  Administered 2020-12-02: 20 mg via INTRAVENOUS

## 2020-12-02 MED ORDER — POTASSIUM CHLORIDE CRYS ER 20 MEQ PO TBCR
40.0000 meq | EXTENDED_RELEASE_TABLET | Freq: Two times a day (BID) | ORAL | Status: AC
  Administered 2020-12-02 (×2): 40 meq via ORAL
  Filled 2020-12-02 (×2): qty 2

## 2020-12-02 MED ORDER — LACTATED RINGERS IV SOLN
INTRAVENOUS | Status: AC | PRN
  Administered 2020-12-02: 1000 mL via INTRAVENOUS

## 2020-12-02 MED ORDER — PROPOFOL 500 MG/50ML IV EMUL
INTRAVENOUS | Status: DC | PRN
  Administered 2020-12-02: 100 ug/kg/min via INTRAVENOUS

## 2020-12-02 SURGICAL SUPPLY — 15 items

## 2020-12-02 NOTE — Anesthesia Postprocedure Evaluation (Signed)
Anesthesia Post Note  Patient: Jason [name]  Procedure(s) Performed: ESOPHAGOGASTRODUODENOSCOPY (EGD) WITH PROPOFOL (N/A )     Patient location during evaluation: Endoscopy Anesthesia Type: MAC Level of consciousness: awake Pain management: pain level controlled Vital Signs Assessment: post-procedure vital signs reviewed and stable Respiratory status: spontaneous breathing, nonlabored ventilation, respiratory function stable and patient connected to nasal cannula oxygen Cardiovascular status: stable and blood pressure returned to baseline Postop Assessment: no apparent nausea or vomiting Anesthetic complications: no   No complications documented.  Last Vitals:  Vitals:   12/02/20 0915 12/02/20 1200  BP: (!) 149/65 (!) 121/56  Pulse: 60 61  Resp: 19 20  Temp: 36.8 C 37 C  SpO2: 96% 96%    Last Pain:  Vitals:   12/02/20 1200  TempSrc: Oral  PainSc: 0-No pain                 Jalissa Heinzelman P Labrisha Wuellner

## 2020-12-02 NOTE — Plan of Care (Signed)
  Problem: Education: Goal: Knowledge of General Education information will improve Description: Including pain rating scale, medication(s)/side effects and non-pharmacologic comfort measures 12/02/2020 1528 by [place], RN Outcome: Progressing 12/02/2020 1528 by [place], RN Outcome: Progressing   Problem: Clinical Measurements: Goal: Ability to maintain clinical measurements within normal limits will improve 12/02/2020 1528 by [place], RN Outcome: Progressing 12/02/2020 1528 by [place], RN Outcome: Progressing Goal: Will remain free from infection 12/02/2020 1528 by [place], RN Outcome: Progressing 12/02/2020 1528 by [place], RN Outcome: Progressing Goal: Diagnostic test results will improve 12/02/2020 1528 by [place], RN Outcome: Progressing 12/02/2020 1528 by [place], RN Outcome: Progressing Goal: Respiratory complications will improve 12/02/2020 1528 by [place], RN Outcome: Progressing 12/02/2020 1528 by [place], RN Outcome: Progressing Goal: Cardiovascular complication will be avoided 12/02/2020 1528 by [place], RN Outcome: Progressing 12/02/2020 1528 by [place], RN Outcome: Progressing   Problem: Activity: Goal: Risk for activity intolerance will decrease 12/02/2020 1528 by [place], RN Outcome: Progressing 12/02/2020 1528 by [place], RN Outcome: Progressing   Problem: Nutrition: Goal: Adequate nutrition will be maintained 12/02/2020 1528 by [place], RN Outcome: Progressing 12/02/2020 1528 by [place], RN Outcome: Progressing   Problem: Coping: Goal: Level of anxiety will decrease 12/02/2020 1528 by [place], RN Outcome: Progressing 12/02/2020 1528 by [place], RN Outcome: Progressing   Problem: Elimination: Goal: Will not experience complications related to bowel motility 12/02/2020 1528 by  [place], RN Outcome: Progressing 12/02/2020 1528 by [place], RN Outcome: Progressing Goal: Will not experience complications related to urinary retention 12/02/2020 1528 by [place], RN Outcome: Progressing 12/02/2020 1528 by [place], RN Outcome: Progressing   Problem: Pain Managment: Goal: General experience of comfort will improve 12/02/2020 1528 by [place], RN Outcome: Progressing 12/02/2020 1528 by [place], RN Outcome: Progressing   Problem: Safety: Goal: Ability to remain free from injury will improve 12/02/2020 1528 by [place], RN Outcome: Progressing 12/02/2020 1528 by [place], RN Outcome: Progressing   Problem: Skin Integrity: Goal: Risk for impaired skin integrity will decrease 12/02/2020 1528 by [place], RN Outcome: Progressing 12/02/2020 1528 by [place], RN Outcome: Progressing

## 2020-12-02 NOTE — Interval H&P Note (Signed)
History and Physical Interval Note:  12/02/2020 8:13 AM  Jason [name]  has presented today for surgery, with the diagnosis of GI bleed.  The various methods of treatment have been discussed with the patient and family. After consideration of risks, benefits and other options for treatment, the patient has consented to  Procedure(s): ESOPHAGOGASTRODUODENOSCOPY (EGD) WITH PROPOFOL (N/A) as a surgical intervention.  The patient's history has been reviewed, patient examined, no change in status, stable for surgery.  I have reviewed the patient's chart and labs.  Questions were answered to the patient's satisfaction.     [name]

## 2020-12-02 NOTE — Op Note (Signed)
[facility] Patient Name: Jason [name] Procedure Date : 12/02/2020 MRN: [id number] Attending MD: [name] , MD Date of Birth: 19-Dec-1940 CSN: [id number] Age: 80 Admit Type: Inpatient Procedure:                Upper GI endoscopy Indications:              Melena Providers:                [name], MD, Erenest Rasher, RN,                            William Dalton, Technician Referring MD:             hospital team Medicines:                Propofol per Anesthesia, Monitored Anesthesia Care Complications:            No immediate complications. Estimated Blood Loss:     Estimated blood loss: none. Procedure:                Pre-Anesthesia Assessment:                           - Prior to the procedure, a History and Physical                            was performed, and patient medications and                            allergies were reviewed. The patient's tolerance of                            previous anesthesia was also reviewed. The risks                            and benefits of the procedure and the sedation                            options and risks were discussed with the patient.                            All questions were answered, and informed consent                            was obtained. Prior Anticoagulants: The patient has                            taken no previous anticoagulant or antiplatelet                            agents. ASA Grade Assessment: III - A patient with                            severe systemic disease. After reviewing the risks  and benefits, the patient was deemed in                            satisfactory condition to undergo the procedure.                           After obtaining informed consent, the endoscope was                            passed under direct vision. Throughout the                            procedure, the patient's blood pressure, pulse, and                             oxygen saturations were monitored continuously. The                            GIF-H190 ([id number]) Olympus gastroscope was                            introduced through the mouth, and advanced to the                            second part of duodenum. The upper GI endoscopy was                            accomplished without difficulty. The patient                            tolerated the procedure well. Scope In: Scope Out: Findings:      LA Grade C (one or more mucosal breaks continuous between tops of 2 or       more mucosal folds, less than 75% circumference) esophagitis with no       bleeding was found in the distal esophagus.      The Z-line was found 42 cm from the incisors.      Localized mild inflammation characterized by congestion (edema) and       erythema was found in the prepyloric region of the stomach.      The examined duodenum was normal.      A medium-sized hiatal hernia was present. Impression:               - LA Grade C reflux esophagitis with no bleeding.                           - Z-line, 42 cm from the incisors.                           - Acute gastritis.                           - Normal examined duodenum.                           -  Medium-sized hiatal hernia.                           - No specimens collected. Recommendation:           - Follow an antireflux regimen.                           - Observe patient's clinical course.                           - Clear liquid diet. Procedure Code(s):        --- Professional ---                           [phone], Esophagogastroduodenoscopy, flexible,                            transoral; diagnostic, including collection of                            specimen(s) by brushing or washing, when performed                            (separate procedure) Diagnosis Code(s):        --- Professional ---                           K92.1, Melena (includes Hematochezia)                           K29.00, Acute gastritis  without bleeding                           K21.00, Gastro-esophageal reflux disease with                            esophagitis, without bleeding                           K44.9, Diaphragmatic hernia without obstruction or                            gangrene CPT copyright 2019 [employer]. All rights reserved. The codes documented in this report are preliminary and upon coder review may  be revised to meet current compliance requirements. [name], MD 12/02/2020 8:43:24 AM This report has been signed electronically. Number of Addenda: 0

## 2020-12-02 NOTE — Transfer of Care (Signed)
Immediate Anesthesia Transfer of Care Note  Patient: Gil Stander  Procedure(s) Performed: ESOPHAGOGASTRODUODENOSCOPY (EGD) WITH PROPOFOL (N/A )  Patient Location: PACU  Anesthesia Type:MAC  Level of Consciousness: sedated  Airway & Oxygen Therapy: Patient Spontanous Breathing  Post-op Assessment: Report given to RN and Post -op Vital signs reviewed and stable  Post vital signs: Reviewed and stable  Last Vitals:  Vitals Value Taken Time  BP    Temp    Pulse 60 12/02/20 0840  Resp 22 12/02/20 0840  SpO2 96 % 12/02/20 0840  Vitals shown include unvalidated device data.  Last Pain:  Vitals:   12/02/20 0734  TempSrc: Oral  PainSc: 0-No pain         Complications: No complications documented.

## 2020-12-02 NOTE — Plan of Care (Signed)

## 2020-12-02 NOTE — Anesthesia Preprocedure Evaluation (Addendum)
Anesthesia Evaluation  Patient identified by MRN, date of birth, ID band Patient awake    Reviewed: Allergy & Precautions, NPO status , Patient's Chart, lab work & pertinent test results  Airway Mallampati: III  TM Distance: >3 FB Neck ROM: Full    Dental  (+) Poor Dentition, Missing   Pulmonary COPD,  COPD inhaler, former smoker, PE   Pulmonary exam normal breath sounds clear to auscultation       Cardiovascular hypertension, Pt. on medications Normal cardiovascular exam Rhythm:Regular Rate:Normal  ECG: ST, rate 102   Neuro/Psych PSYCHIATRIC DISORDERS negative neurological ROS     GI/Hepatic negative GI ROS, (+)     substance abuse  ,   Endo/Other  negative endocrine ROS  Renal/GU negative Renal ROS     Musculoskeletal negative musculoskeletal ROS (+)   Abdominal   Peds  Hematology  (+) Blood dyscrasia, anemia , HLD   Anesthesia Other Findings GI bleed s/p transfusion 2 units PRBC's  Reproductive/Obstetrics                            Anesthesia Physical Anesthesia Plan  ASA: III  Anesthesia Plan: MAC   Post-op Pain Management:    Induction: Intravenous  PONV Risk Score and Plan: 1 and Propofol infusion and Treatment may vary due to age or medical condition  Airway Management Planned: Nasal Cannula  Additional Equipment:   Intra-op Plan:   Post-operative Plan:   Informed Consent: I have reviewed the patients History and Physical, chart, labs and discussed the procedure including the risks, benefits and alternatives for the proposed anesthesia with the patient or authorized representative who has indicated his/her understanding and acceptance.     Dental advisory given  Plan Discussed with: CRNA  Anesthesia Plan Comments:        Anesthesia Quick Evaluation

## 2020-12-02 NOTE — Progress Notes (Signed)
PROGRESS NOTE    Jason [name]  VOJ:[id number] DOB: [birth year] DOA: 11/30/2020 PCP: Lujean Amel, MD   Chief Complaint  Patient presents with  . GI Bleeding  Brief Narrative: 80 year old male with history of pulmonary embolism on Xarelto, hypertension, COPD, chronic alcohol abuse, fatty liver presented with black tarry stool, then bloody diarrhea x1 week without abdominal pain nausea or vomiting and feeling gradually more tired and short of breath with exertion.  Has had chronic back pain.Seen in the ED with Hemoccult positive stool, hemoglobin 8.3 baseline was more than 13 g last year also with AKI creatinine 1.7.  GI was consulted and patient was admitted. GI consulted, H&H monitored-required 3 units PRBC transfusion  Subjective:  S/p 2 units prbc, hb stable. EGD this am Feels fine Hard of hearing  Assessment & Plan:  GI bleed with melena: s/p total 3 units PRBC.  GI following, EGD this morning showed LA grade C reflux esophagitis with nonbleeding, Z-line 40 cm from the incisura, acute gastritis, normal duodenum medium-sized hiatal hernia.  Continue PPI/antireflux regimen clear liquid diet today monitor  H/h.  Acute blood loss anemia due to #1.  Baseline hemoglobin 11-13 gm past year.  S/p 3 units prbc transfusion. H.h appropriately increased continue to monitor serial H&H and transfuse if less than 7 g  Recent Labs  Lab 11/30/20 2248 12/01/20 0430 12/01/20 1442 12/02/20 0407 12/02/20 0712  HGB 8.2* 8.1* 6.9* 9.6* 9.7*  HCT 25.5* 25.2* 21.7* 29.1* 29.2*   AKI likely from prerenal volume depletion and GI bleed.  Resolved. Recent Labs  Lab 11/30/20 1302 12/01/20 0430 12/02/20 0712  BUN 50* 42* 23  CREATININE 1.71* 1.09 1.07   History of COVID-19 infection last year-not active  History of PE on Xarelto: Continue to hold Xarelto until cleared by GI.   Hypokalemia we will replete again today.  Hypertension: BP stable. Cont to hold meds  COPD-not in  nexacerbation  Chronic alcohol abuse/fatty liver-Patient reports he has cut down his ( liquor) alcohol intake significantly since January now only drinking 5% alcohol every other day.  Watch for any signs of withdrawal.  Morbid obesity BMI 31.  Will benefit with PCP follow-up and weight loss.  Nutrition: Diet Order            Diet NPO time specified  Diet effective now                 Body mass index is 31.13 kg/m.  DVT prophylaxis: SCDs Start: 11/30/20 1650 Code Status:   Code Status: Full Code  Family Communication: plan of care discussed with patient at bedside.  Status is: Inpatient Remains inpatient appropriate because:IV treatments appropriate due to intensity of illness or inability to take PO and For ongoing acute anemia and GI blood loss work-up and management  Dispo: The patient is from: Home              Anticipated d/c is to: Home              Anticipated d/c date is: 1 day              Patient currently is not medically stable to d/c.   Difficult to place patient No   Consultants:see note  Procedures:see note  Culture/Microbiology    Component Value Date/Time   SDES BLOOD LEFT ANTECUBITAL 06/16/2019 1505   SPECREQUEST  06/16/2019 1505    BOTTLES DRAWN AEROBIC AND ANAEROBIC Blood Culture adequate volume   CULT  06/16/2019 1505  NO GROWTH 5 DAYS Performed at [facility], [place] [address] [place],  [address]    REPTSTATUS 06/21/2019 FINAL 06/16/2019 1505    Other culture-see note  Medications: Scheduled Meds: . sodium chloride   Intravenous Once  . folic acid  1 mg Oral Daily  . multivitamin with minerals  1 tablet Oral Daily  . pantoprazole (PROTONIX) IV  40 mg Intravenous Q12H  . pravastatin  80 mg Oral Daily  . thiamine  100 mg Oral Daily   Or  . thiamine  100 mg Intravenous Daily  . traZODone  100 mg Oral QHS   Continuous Infusions:   Antimicrobials: Anti-infectives (From admission, onward)   None      Objective: Vitals: Today's Vitals   12/02/20 0734 12/02/20 0840 12/02/20 0855 12/02/20 0915  BP: (!) 150/64 (!) 97/56 (!) 102/59 (!) 149/65  Pulse: 64 60 66 60  Resp: 14 20 13 19   Temp: 97.9 F (36.6 C) (!) 97.3 F (36.3 C) (!) 97.5 F (36.4 C) 98.3 F (36.8 C)  TempSrc: Oral   Oral  SpO2: 97% 96% 96% 96%  Weight: 98.4 kg     PainSc: 0-No pain Asleep 0-No pain 0-No pain    Intake/Output Summary (Last 24 hours) at 12/02/2020 1033 Last data filed at 12/02/2020 0905 Gross per 24 hour  Intake 2083 ml  Output 575 ml  Net 1508 ml   Filed Weights   12/01/20 1800 12/02/20 0734  Weight: 98.4 kg 98.4 kg   Weight change:   Intake/Output from previous day: 01/28 0701 - 01/29 0700 In: 1883 [I.V.:1000; Blood:883] Out: 575 [Urine:575] Intake/Output this shift: Total I/O In: 200 [I.V.:200] Out: 0  Filed Weights   12/01/20 1800 12/02/20 0734  Weight: 98.4 kg 98.4 kg    Examination: General exam: AAOx3, very hard of hearing, NAD, weak appearing. HEENT:Oral mucosa moist, Ear/Nose WNL grossly, dentition normal. Respiratory system: bilaterally clear,no wheezing or crackles,no use of accessory muscle Cardiovascular system: S1 & S2 +, No JVD,. Gastrointestinal system: Abdomen soft, NT,ND, BS+ Nervous System:Alert, awake, moving extremities and grossly nonfocal Extremities: No edema, distal peripheral pulses palpable.  Skin: No rashes,no icterus. MSK: Normal muscle bulk,tone, power   Data Reviewed: I have personally reviewed following labs and imaging studies CBC: Recent Labs  Lab 11/30/20 1302 11/30/20 2248 12/01/20 0430 12/01/20 1442 12/02/20 0407 12/02/20 0712  WBC 13.8*  --  10.4  --   --  8.2  HGB 8.3* 8.2* 8.1* 6.9* 9.6* 9.7*  HCT 25.7* 25.5* 25.2* 21.7* 29.1* 29.2*  MCV 103.2*  --  96.2  --   --  93.0  PLT 290  --  209  --   --  329   Basic Metabolic Panel: Recent Labs  Lab 11/30/20 1302 11/30/20 2248 12/01/20 0430 12/02/20 0712  NA 132*  --  136 138  K  3.1*  --  3.3* 3.3*  CL 100  --  107 107  CO2 19*  --  18* 22  GLUCOSE 162*  --  107* 113*  BUN 50*  --  42* 23  CREATININE 1.71*  --  1.09 1.07  CALCIUM 8.9  --  8.3* 8.3*  MG  --  1.8  --   --   PHOS  --  3.1  --   --    GFR: Estimated Creatinine Clearance: 64.8 mL/min (by C-G formula based on SCr of 1.07 mg/dL). Liver Function Tests: Recent Labs  Lab 11/30/20 1302  AST 23  ALT 23  ALKPHOS 35*  BILITOT 0.2*  PROT 6.1*  ALBUMIN 3.4*   No results for input(s): LIPASE, AMYLASE in the last 168 hours. No results for input(s): AMMONIA in the last 168 hours. Coagulation Profile: Recent Labs  Lab 11/30/20 2248  INR 1.7*   Cardiac Enzymes: No results for input(s): CKTOTAL, CKMB, CKMBINDEX, TROPONINI in the last 168 hours. BNP (last 3 results) No results for input(s): PROBNP in the last 8760 hours. HbA1C: No results for input(s): HGBA1C in the last 72 hours. CBG: No results for input(s): GLUCAP in the last 168 hours. Lipid Profile: No results for input(s): CHOL, HDL, LDLCALC, TRIG, CHOLHDL, LDLDIRECT in the last 72 hours. Thyroid Function Tests: No results for input(s): TSH, T4TOTAL, FREET4, T3FREE, THYROIDAB in the last 72 hours. Anemia Panel: No results for input(s): VITAMINB12, FOLATE, FERRITIN, TIBC, IRON, RETICCTPCT in the last 72 hours. Sepsis Labs: No results for input(s): PROCALCITON, LATICACIDVEN in the last 168 hours.  Recent Results (from the past 240 hour(s))  SARS Coronavirus 2 by RT PCR (hospital order, performed in [facility] hospital lab) Nasopharyngeal Nasopharyngeal Swab     Status: None   Collection Time: 11/30/20  4:24 PM   Specimen: Nasopharyngeal Swab  Result Value Ref Range Status   SARS Coronavirus 2 NEGATIVE NEGATIVE Final    Comment: (NOTE) SARS-CoV-2 target nucleic acids are NOT DETECTED.  The SARS-CoV-2 RNA is generally detectable in upper and lower respiratory specimens during the acute phase of infection. The lowest concentration of  SARS-CoV-2 viral copies this assay can detect is 250 copies / mL. A negative result does not preclude SARS-CoV-2 infection and should not be used as the sole basis for treatment or other patient management decisions.  A negative result may occur with improper specimen collection / handling, submission of specimen other than nasopharyngeal swab, presence of viral mutation(s) within the areas targeted by this assay, and inadequate number of viral copies (<250 copies / mL). A negative result must be combined with clinical observations, patient history, and epidemiological information.  Fact Sheet for Patients:   StrictlyIdeas.no  Fact Sheet for Healthcare Providers: BankingDealers.co.za  This test is not yet approved or  cleared by the [place] FDA and has been authorized for detection and/or diagnosis of SARS-CoV-2 by FDA under an Emergency Use Authorization (EUA).  This EUA will remain in effect (meaning this test can be used) for the duration of the COVID-19 declaration under Section 564(b)(1) of the Act, 21 U.S.C. section 360bbb-3(b)(1), unless the authorization is terminated or revoked sooner.  Performed at [facility], [place] [address], [place], [place] [address]   MRSA PCR Screening     Status: None   Collection Time: 12/01/20  6:47 PM   Specimen: Nasal Mucosa; Nasopharyngeal  Result Value Ref Range Status   MRSA by PCR NEGATIVE NEGATIVE Final    Comment:        The GeneXpert MRSA Assay (FDA approved for NASAL specimens only), is one component of a comprehensive MRSA colonization surveillance program. It is not intended to diagnose MRSA infection nor to guide or monitor treatment for MRSA infections. Performed at [facility], [place] [address], [place], [place] [address]      Radiology Studies: No results found.   LOS: 2 days   Antonieta Pert, MD [facility]  12/02/2020, 10:33 AM

## 2020-12-03 ENCOUNTER — Encounter (HOSPITAL_COMMUNITY): Payer: Self-pay | Admitting: Gastroenterology

## 2020-12-03 DIAGNOSIS — K922 Gastrointestinal hemorrhage, unspecified: Secondary | ICD-10-CM | POA: Diagnosis not present

## 2020-12-03 LAB — BASIC METABOLIC PANEL
Anion gap: 9 (ref 5–15)
BUN: 15 mg/dL (ref 8–23)
CO2: 22 mmol/L (ref 22–32)
Calcium: 8.5 mg/dL — ABNORMAL LOW (ref 8.9–10.3)
Chloride: 108 mmol/L (ref 98–111)
Creatinine, Ser: 1.05 mg/dL (ref 0.61–1.24)
GFR, Estimated: >60 mL/min (ref >60)
Glucose, Bld: 102 mg/dL — ABNORMAL HIGH (ref 70–99)
Potassium: 4 mmol/L (ref 3.5–5.1)
Sodium: 139 mmol/L (ref 135–145)

## 2020-12-03 LAB — HEMOGLOBIN AND HEMATOCRIT, BLOOD
HCT: 29.6 % — ABNORMAL LOW (ref 39.0–52.0)
Hemoglobin: 9.6 g/dL — ABNORMAL LOW (ref 13.0–17.0)

## 2020-12-03 LAB — CBC
HCT: 27.6 % — ABNORMAL LOW (ref 39.0–52.0)
Hemoglobin: 9.5 g/dL — ABNORMAL LOW (ref 13.0–17.0)
MCH: 32 pg (ref 26.0–34.0)
MCHC: 34.4 g/dL (ref 30.0–36.0)
MCV: 92.9 fL (ref 80.0–100.0)
Platelets: 233 10*3/uL (ref 150–400)
RBC: 2.97 MIL/uL — ABNORMAL LOW (ref 4.22–5.81)
RDW: 18.9 % — ABNORMAL HIGH (ref 11.5–15.5)
WBC: 8.2 10*3/uL (ref 4.0–10.5)
nRBC: 0 % (ref 0.0–0.2)

## 2020-12-03 MED ORDER — RIVAROXABAN 20 MG PO TABS
20.0000 mg | ORAL_TABLET | Freq: Every day | ORAL | Status: DC
  Administered 2020-12-03: 20 mg via ORAL
  Filled 2020-12-03: qty 1

## 2020-12-03 MED ORDER — PANTOPRAZOLE SODIUM 40 MG PO TBEC: 40.0000 mg | DELAYED_RELEASE_TABLET | Freq: Two times a day (BID) | ORAL | 1 refills | Status: DC

## 2020-12-03 NOTE — Progress Notes (Signed)
RN went over pt discharge summary with pt. NT removed IV. Pt is dressed and has belongings with him. Pt's son will transport patient home.

## 2020-12-03 NOTE — Discharge Summary (Signed)
Physician Discharge Summary  Jason [name] SNK:[id number] DOB: [birth year] DOA: 11/30/2020  PCP: Lujean Amel, MD  Admit date: 11/30/2020 Discharge date: 12/03/2020  Admitted From: Home Disposition: Home  Recommendations for Outpatient Follow-up:  1. Follow up with PCP in 1-2 weeks 2. Please obtain BMP/CBC in one week 3. Please follow up on the following pending results:  Home Health: No Equipment/Devices: N/A  Discharge Condition: Stable Code Status:   Code Status: Full Code Diet recommendation:  Diet Order            Diet regular Room service appropriate? Yes; Fluid consistency: Thin  Diet effective now           Diet general                  Brief/Interim Summary: 80 year old male with history of pulmonary embolism on Xarelto, hypertension, COPD, chronic alcohol abuse, fatty liver presented with black tarry stool, then bloody diarrhea x1 week without abdominal pain nausea or vomiting and feeling gradually more tired and short of breath with exertion.  Has had chronic back pain.Seen in the ED with Hemoccult positive stool, hemoglobin 8.3 baseline was more than 13 g last year also with AKI creatinine 1.7.  GI was consulted and patient was admitted. GI consulted, H&H monitored-required 3 units PRBC transfusion Patient had EGD done placed on clear liquid diet and diet was advanced slowly.  At this time is medically stable for discharge, hemoglobin is improved.cont on ppi bid x 60 days then daily , okay to resume xarelto per dr schooler. I discussed with patient's family for follow-up recommendation. Discharge Diagnoses:   GI bleed with melena: s/p total 3 units PRBC. See by GI. S/p EGD-showed LA grade C reflux esophagitis with nonbleeding, Z-line 40 cm from the incisura, acute gastritis, normal duodenum medium-sized hiatal hernia.  Continue PPI/antireflux regimen-as instructed.  Acute blood loss anemia due to #1.  Baseline hemoglobin 11-13 gm past year.  S/p 3 units prbc  transfusion. H.h is stable in mid 9 gm now.   Recent Labs  Lab 12/02/20 0407 12/02/20 0712 12/02/20 1732 12/03/20 0105 12/03/20 0913  HGB 9.6* 9.7* 9.7* 9.5* 9.6*  HCT 29.1* 29.2* 29.7* 27.6* 29.6*   AKI likely from prerenal volume depletion and GI bleed.  Resolved. Recent Labs  Lab 11/30/20 1302 12/01/20 0430 12/02/20 0712 12/03/20 0105  BUN 50* 42* 23 15  CREATININE 1.71* 1.09 1.07 1.05   History of COVID-19 infection last year-not active  History of PE on Xarelto: held Xarelto here 2/2 gi bleed.Resumed as per gi Patient is also on aspirin I have asked his family to look into that and he is going to contact patient's doctor/primary care who had started him on that to see how essential it is for him to take it or if he can stop it.  Hypokalemia we will replete again today.  Hypertension: BP stable. Cont to hold meds  COPD-not in nexacerbation  Chronic alcohol abuse/fatty liver-Patient reports he has cut down his ( liquor) alcohol intake significantly since January now only drinking 5% alcohol every other day.  Watch for any signs of withdrawal.  Morbid obesity BMI 31.  Will benefit with PCP follow-up and weight loss.   Consults:  GI  Subjective: Alert awake oriented x3 tolerating diet.  No complaints. Discharge Exam: Vitals:   12/03/20 0359 12/03/20 0720  BP: 106/62 (!) 128/99  Pulse: 75 65  Resp: (!) 21 16  Temp: 97.8 F (36.6 C) 98.5 F (36.9 C)  SpO2: 96% 96%   General: Pt is alert, awake, not in acute distress Cardiovascular: RRR, S1/S2 +, no rubs, no gallops Respiratory: CTA bilaterally, no wheezing, no rhonchi Abdominal: Soft, NT, ND, bowel sounds + Extremities: no edema, no cyanosis  Discharge Instructions  Discharge Instructions    Diet general   Complete by: As directed    Discharge instructions   Complete by: As directed    Please call call MD or return to ER for similar or worsening recurring problem that brought you to hospital or  if any fever,nausea/vomiting,abdominal pain, uncontrolled pain, chest pain,  shortness of breath or any other alarming symptoms.  Please follow-up your doctor as instructed in a week time and call the office for appointment.  Please avoid alcohol, smoking, or any other illicit substance and maintain healthy habits including taking your regular medications as prescribed.  You were cared for by a hospitalist during your hospital stay. If you have any questions about your discharge medications or the care you received while you were in the hospital after you are discharged, you can call the unit and ask to speak with the hospitalist on call if the hospitalist that took care of you is not available.  Once you are discharged, your primary care physician will handle any further medical issues. Please note that NO REFILLS for any discharge medications will be authorized once you are discharged, as it is imperative that you return to your primary care physician (or establish a relationship with a primary care physician if you do not have one) for your aftercare needs so that they can reassess your need for medications and monitor your lab values   Increase activity slowly   Complete by: As directed      Allergies as of 12/03/2020   No Known Allergies     Medication List    TAKE these medications   acetaminophen 650 MG CR tablet Commonly known as: TYLENOL Take 650 mg by mouth 2 (two) times daily.   aspirin 325 MG EC tablet Take 325 mg by mouth daily.   Fluticasone-Salmeterol 250-50 MCG/DOSE Aepb Commonly known as: ADVAIR Inhale 1 puff into the lungs 2 (two) times daily.   JOINT FORMULA PO Take 1 tablet by mouth daily.   lisinopril-hydrochlorothiazide 20-25 MG tablet Commonly known as: ZESTORETIC Take 1 tablet by mouth daily.   multivitamin with minerals tablet Take 1 tablet by mouth daily.   pantoprazole 40 MG tablet Commonly known as: Protonix Take 1 tablet (40 mg total) by mouth 2  (two) times daily. After 2 months go back to once a day regimen   potassium chloride SA 20 MEQ tablet Commonly known as: KLOR-CON Take 2 tablets (40 mEq total) by mouth daily.   pravastatin 80 MG tablet Commonly known as: PRAVACHOL Take 80 mg by mouth every evening.   Prostate Health Caps Take 1 capsule by mouth daily.   tiotropium 18 MCG inhalation capsule Commonly known as: SPIRIVA Place 18 mcg into inhaler and inhale daily.   traZODone 100 MG tablet Commonly known as: DESYREL Take 100 mg by mouth at bedtime.   Vitamin D3 50 MCG (2000 UT) Tabs Take 2,000 Units by mouth daily.   Xarelto 20 MG Tabs tablet Generic drug: rivaroxaban Take 20 mg by mouth daily.       Follow-up Information    Koirala, Dibas, MD Follow up in 1 week(s).   Specialty: Family Medicine Contact information: [place] [address] [phone]  No Known Allergies  The results of significant diagnostics from this hospitalization (including imaging, microbiology, ancillary and laboratory) are listed below for reference.    Microbiology: Recent Results (from the past 240 hour(s))  SARS Coronavirus 2 by RT PCR (hospital order, performed in [facility] hospital lab) Nasopharyngeal Nasopharyngeal Swab     Status: None   Collection Time: 11/30/20  4:24 PM   Specimen: Nasopharyngeal Swab  Result Value Ref Range Status   SARS Coronavirus 2 NEGATIVE NEGATIVE Final    Comment: (NOTE) SARS-CoV-2 target nucleic acids are NOT DETECTED.  The SARS-CoV-2 RNA is generally detectable in upper and lower respiratory specimens during the acute phase of infection. The lowest concentration of SARS-CoV-2 viral copies this assay can detect is 250 copies / mL. A negative result does not preclude SARS-CoV-2 infection and should not be used as the sole basis for treatment or other patient management decisions.  A negative result may occur with improper specimen  collection / handling, submission of specimen other than nasopharyngeal swab, presence of viral mutation(s) within the areas targeted by this assay, and inadequate number of viral copies (<250 copies / mL). A negative result must be combined with clinical observations, patient history, and epidemiological information.  Fact Sheet for Patients:   StrictlyIdeas.no  Fact Sheet for Healthcare Providers: BankingDealers.co.za  This test is not yet approved or  cleared by the [place] FDA and has been authorized for detection and/or diagnosis of SARS-CoV-2 by FDA under an Emergency Use Authorization (EUA).  This EUA will remain in effect (meaning this test can be used) for the duration of the COVID-19 declaration under Section 564(b)(1) of the Act, 21 U.S.C. section 360bbb-3(b)(1), unless the authorization is terminated or revoked sooner.  Performed at [facility], [place] [address] [place], [place] [address]   MRSA PCR Screening     Status: None   Collection Time: 12/01/20  6:47 PM   Specimen: Nasal Mucosa; Nasopharyngeal  Result Value Ref Range Status   MRSA by PCR NEGATIVE NEGATIVE Final    Comment:        The GeneXpert MRSA Assay (FDA approved for NASAL specimens only), is one component of a comprehensive MRSA colonization surveillance program. It is not intended to diagnose MRSA infection nor to guide or monitor treatment for MRSA infections. Performed at [facility], [place] [address] [place], [place] [address]     Procedures/Studies: No results found.  Labs: BNP (last 3 results) No results for input(s): BNP in the last 8760 hours. Basic Metabolic Panel: Recent Labs  Lab 11/30/20 1302 11/30/20 2248 12/01/20 0430 12/02/20 0712 12/03/20 0105  NA 132*  --  136 138 139  K 3.1*  --  3.3* 3.3* 4.0  CL 100  --  107 107 108  CO2 19*  --  18* 22 22  GLUCOSE 162*  --  107* 113* 102*  BUN 50*  --  42* 23 15   CREATININE 1.71*  --  1.09 1.07 1.05  CALCIUM 8.9  --  8.3* 8.3* 8.5*  MG  --  1.8  --   --   --   PHOS  --  3.1  --   --   --    Liver Function Tests: Recent Labs  Lab 11/30/20 1302  AST 23  ALT 23  ALKPHOS 35*  BILITOT 0.2*  PROT 6.1*  ALBUMIN 3.4*   No results for input(s): LIPASE, AMYLASE in the last 168 hours. No results for  input(s): AMMONIA in the last 168 hours. CBC: Recent Labs  Lab 11/30/20 1302 11/30/20 2248 12/01/20 0430 12/01/20 1442 12/02/20 0407 12/02/20 0712 12/02/20 1732 12/03/20 0105 12/03/20 0913  WBC 13.8*  --  10.4  --   --  8.2  --  8.2  --   HGB 8.3*   < > 8.1*   < > 9.6* 9.7* 9.7* 9.5* 9.6*  HCT 25.7*   < > 25.2*   < > 29.1* 29.2* 29.7* 27.6* 29.6*  MCV 103.2*  --  96.2  --   --  93.0  --  92.9  --   PLT 290  --  209  --   --  219  --  233  --    < > = values in this interval not displayed.   Cardiac Enzymes: No results for input(s): CKTOTAL, CKMB, CKMBINDEX, TROPONINI in the last 168 hours. BNP: Invalid input(s): POCBNP CBG: No results for input(s): GLUCAP in the last 168 hours. D-Dimer No results for input(s): DDIMER in the last 72 hours. Hgb A1c No results for input(s): HGBA1C in the last 72 hours. Lipid Profile No results for input(s): CHOL, HDL, LDLCALC, TRIG, CHOLHDL, LDLDIRECT in the last 72 hours. Thyroid function studies No results for input(s): TSH, T4TOTAL, T3FREE, THYROIDAB in the last 72 hours.  Invalid input(s): FREET3 Anemia work up No results for input(s): VITAMINB12, FOLATE, FERRITIN, TIBC, IRON, RETICCTPCT in the last 72 hours. Urinalysis    Component Value Date/Time   COLORURINE YELLOW 05/31/2019 2218   APPEARANCEUR CLEAR 05/31/2019 2218   LABSPEC >1.030 (H) 05/31/2019 2218   PHURINE 5.5 05/31/2019 2218   GLUCOSEU NEGATIVE 05/31/2019 2218   HGBUR SMALL (A) 05/31/2019 2218   BILIRUBINUR MODERATE (A) 05/31/2019 2218   KETONESUR 40 (A) 05/31/2019 2218   PROTEINUR 100 (A) 05/31/2019 2218   NITRITE NEGATIVE  05/31/2019 2218   LEUKOCYTESUR NEGATIVE 05/31/2019 2218   Sepsis Labs Invalid input(s): PROCALCITONIN,  WBC,  LACTICIDVEN Microbiology Recent Results (from the past 240 hour(s))  SARS Coronavirus 2 by RT PCR (hospital order, performed in [place] hospital lab) Nasopharyngeal Nasopharyngeal Swab     Status: None   Collection Time: 11/30/20  4:24 PM   Specimen: Nasopharyngeal Swab  Result Value Ref Range Status   SARS Coronavirus 2 NEGATIVE NEGATIVE Final    Comment: (NOTE) SARS-CoV-2 target nucleic acids are NOT DETECTED.  The SARS-CoV-2 RNA is generally detectable in upper and lower respiratory specimens during the acute phase of infection. The lowest concentration of SARS-CoV-2 viral copies this assay can detect is 250 copies / mL. A negative result does not preclude SARS-CoV-2 infection and should not be used as the sole basis for treatment or other patient management decisions.  A negative result may occur with improper specimen collection / handling, submission of specimen other than nasopharyngeal swab, presence of viral mutation(s) within the areas targeted by this assay, and inadequate number of viral copies (<250 copies / mL). A negative result must be combined with clinical observations, patient history, and epidemiological information.  Fact Sheet for Patients:   StrictlyIdeas.no  Fact Sheet for Healthcare Providers: BankingDealers.co.za  This test is not yet approved or  cleared by the [place] FDA and has been authorized for detection and/or diagnosis of SARS-CoV-2 by FDA under an Emergency Use Authorization (EUA).  This EUA will remain in effect (meaning this test can be used) for the duration of the COVID-19 declaration under Section 564(b)(1) of the Act, 21 U.S.C. section 360bbb-3(b)(1),  unless the authorization is terminated or revoked sooner.  Performed at [facility], [place] [address],  [place], [place] [address]   MRSA PCR Screening     Status: None   Collection Time: 12/01/20  6:47 PM   Specimen: Nasal Mucosa; Nasopharyngeal  Result Value Ref Range Status   MRSA by PCR NEGATIVE NEGATIVE Final    Comment:        The GeneXpert MRSA Assay (FDA approved for NASAL specimens only), is one component of a comprehensive MRSA colonization surveillance program. It is not intended to diagnose MRSA infection nor to guide or monitor treatment for MRSA infections. Performed at [facility], [place] [address], [place], [place] [address]      Time coordinating discharge: 35 minutes  SIGNED: Antonieta Pert, MD  [facility] 12/03/2020, 11:43 AM  If 7PM-7AM, please contact night-coverage [email or web address]

## 2020-12-03 NOTE — Plan of Care (Signed)

## 2020-12-11 DIAGNOSIS — E7800 Pure hypercholesterolemia, unspecified: Secondary | ICD-10-CM | POA: Diagnosis not present

## 2020-12-11 DIAGNOSIS — Z7901 Long term (current) use of anticoagulants: Secondary | ICD-10-CM | POA: Diagnosis not present

## 2020-12-11 DIAGNOSIS — Z79899 Other long term (current) drug therapy: Secondary | ICD-10-CM | POA: Diagnosis not present

## 2020-12-11 DIAGNOSIS — J439 Emphysema, unspecified: Secondary | ICD-10-CM | POA: Diagnosis not present

## 2020-12-11 DIAGNOSIS — D6859 Other primary thrombophilia: Secondary | ICD-10-CM | POA: Diagnosis not present

## 2020-12-11 DIAGNOSIS — K2901 Acute gastritis with bleeding: Secondary | ICD-10-CM | POA: Diagnosis not present

## 2020-12-11 DIAGNOSIS — I10 Essential (primary) hypertension: Secondary | ICD-10-CM | POA: Diagnosis not present

## 2021-01-01 DIAGNOSIS — I10 Essential (primary) hypertension: Secondary | ICD-10-CM | POA: Diagnosis not present

## 2021-01-01 DIAGNOSIS — J449 Chronic obstructive pulmonary disease, unspecified: Secondary | ICD-10-CM | POA: Diagnosis not present

## 2021-01-01 DIAGNOSIS — E7800 Pure hypercholesterolemia, unspecified: Secondary | ICD-10-CM | POA: Diagnosis not present

## 2021-01-01 DIAGNOSIS — Z79899 Other long term (current) drug therapy: Secondary | ICD-10-CM | POA: Diagnosis not present

## 2021-01-01 DIAGNOSIS — N401 Enlarged prostate with lower urinary tract symptoms: Secondary | ICD-10-CM | POA: Diagnosis not present

## 2021-01-01 DIAGNOSIS — Z7901 Long term (current) use of anticoagulants: Secondary | ICD-10-CM | POA: Diagnosis not present

## 2021-01-01 DIAGNOSIS — Z125 Encounter for screening for malignant neoplasm of prostate: Secondary | ICD-10-CM | POA: Diagnosis not present

## 2021-01-01 DIAGNOSIS — Z0001 Encounter for general adult medical examination with abnormal findings: Secondary | ICD-10-CM | POA: Diagnosis not present

## 2021-01-01 DIAGNOSIS — R911 Solitary pulmonary nodule: Secondary | ICD-10-CM | POA: Diagnosis not present

## 2021-01-01 DIAGNOSIS — J439 Emphysema, unspecified: Secondary | ICD-10-CM | POA: Diagnosis not present

## 2021-01-01 DIAGNOSIS — D6859 Other primary thrombophilia: Secondary | ICD-10-CM | POA: Diagnosis not present

## 2021-01-01 DIAGNOSIS — K769 Liver disease, unspecified: Secondary | ICD-10-CM | POA: Diagnosis not present

## 2021-01-04 ENCOUNTER — Other Ambulatory Visit: Payer: Self-pay | Admitting: Family Medicine

## 2021-01-04 DIAGNOSIS — K769 Liver disease, unspecified: Secondary | ICD-10-CM

## 2021-01-15 ENCOUNTER — Other Ambulatory Visit: Payer: Self-pay | Admitting: Gastroenterology

## 2021-01-15 DIAGNOSIS — Z1211 Encounter for screening for malignant neoplasm of colon: Secondary | ICD-10-CM

## 2021-01-15 DIAGNOSIS — Z86010 Personal history of colonic polyps: Secondary | ICD-10-CM | POA: Diagnosis not present

## 2021-01-15 DIAGNOSIS — K2901 Acute gastritis with bleeding: Secondary | ICD-10-CM | POA: Diagnosis not present

## 2021-01-17 ENCOUNTER — Ambulatory Visit
Admission: RE | Admit: 2021-01-17 | Discharge: 2021-01-17 | Disposition: A | Discharge status: Home / Self Care | Payer: Medicare HMO | Source: Ambulatory Visit | Attending: Family Medicine | Admitting: Family Medicine

## 2021-01-17 DIAGNOSIS — K7689 Other specified diseases of liver: Secondary | ICD-10-CM | POA: Diagnosis not present

## 2021-01-17 DIAGNOSIS — K769 Liver disease, unspecified: Secondary | ICD-10-CM

## 2021-02-05 DIAGNOSIS — N401 Enlarged prostate with lower urinary tract symptoms: Secondary | ICD-10-CM | POA: Diagnosis not present

## 2021-02-05 DIAGNOSIS — R972 Elevated prostate specific antigen [PSA]: Secondary | ICD-10-CM | POA: Diagnosis not present

## 2021-02-05 DIAGNOSIS — R351 Nocturia: Secondary | ICD-10-CM | POA: Diagnosis not present

## 2021-02-05 DIAGNOSIS — K624 Stenosis of anus and rectum: Secondary | ICD-10-CM | POA: Diagnosis not present

## 2021-02-07 ENCOUNTER — Ambulatory Visit
Admission: RE | Admit: 2021-02-07 | Discharge: 2021-02-07 | Disposition: A | Discharge status: Home / Self Care | Payer: Medicare HMO | Source: Ambulatory Visit | Attending: Gastroenterology | Admitting: Gastroenterology

## 2021-02-07 DIAGNOSIS — I700 Atherosclerosis of aorta: Secondary | ICD-10-CM | POA: Diagnosis not present

## 2021-02-07 DIAGNOSIS — J984 Other disorders of lung: Secondary | ICD-10-CM | POA: Diagnosis not present

## 2021-02-07 DIAGNOSIS — Z1211 Encounter for screening for malignant neoplasm of colon: Secondary | ICD-10-CM

## 2021-02-07 DIAGNOSIS — K5730 Diverticulosis of large intestine without perforation or abscess without bleeding: Secondary | ICD-10-CM | POA: Diagnosis not present

## 2021-02-07 DIAGNOSIS — K8020 Calculus of gallbladder without cholecystitis without obstruction: Secondary | ICD-10-CM | POA: Diagnosis not present

## 2021-02-27 DIAGNOSIS — K2970 Gastritis, unspecified, without bleeding: Secondary | ICD-10-CM | POA: Diagnosis not present

## 2021-02-27 DIAGNOSIS — K624 Stenosis of anus and rectum: Secondary | ICD-10-CM | POA: Diagnosis not present

## 2021-02-27 DIAGNOSIS — I4949 Other premature depolarization: Secondary | ICD-10-CM | POA: Diagnosis not present

## 2021-02-27 DIAGNOSIS — I499 Cardiac arrhythmia, unspecified: Secondary | ICD-10-CM | POA: Diagnosis not present

## 2021-02-27 DIAGNOSIS — N401 Enlarged prostate with lower urinary tract symptoms: Secondary | ICD-10-CM | POA: Diagnosis not present

## 2021-02-27 DIAGNOSIS — I10 Essential (primary) hypertension: Secondary | ICD-10-CM | POA: Diagnosis not present

## 2021-05-02 DIAGNOSIS — R972 Elevated prostate specific antigen [PSA]: Secondary | ICD-10-CM | POA: Diagnosis not present

## 2021-05-09 DIAGNOSIS — R351 Nocturia: Secondary | ICD-10-CM | POA: Diagnosis not present

## 2021-05-09 DIAGNOSIS — R972 Elevated prostate specific antigen [PSA]: Secondary | ICD-10-CM | POA: Diagnosis not present

## 2021-05-09 DIAGNOSIS — N401 Enlarged prostate with lower urinary tract symptoms: Secondary | ICD-10-CM | POA: Diagnosis not present

## 2021-05-11 ENCOUNTER — Ambulatory Visit: Payer: Medicare HMO | Admitting: Internal Medicine

## 2021-05-11 ENCOUNTER — Other Ambulatory Visit: Payer: Self-pay | Admitting: Urology

## 2021-05-11 DIAGNOSIS — R972 Elevated prostate specific antigen [PSA]: Secondary | ICD-10-CM

## 2021-05-16 ENCOUNTER — Ambulatory Visit (INDEPENDENT_AMBULATORY_CARE_PROVIDER_SITE_OTHER)
Admission: RE | Admit: 2021-05-16 | Discharge: 2021-05-16 | Disposition: A | Discharge status: Home / Self Care | Payer: Medicare HMO | Source: Ambulatory Visit | Attending: Internal Medicine | Admitting: Internal Medicine

## 2021-05-16 ENCOUNTER — Other Ambulatory Visit: Payer: Self-pay

## 2021-05-16 DIAGNOSIS — J479 Bronchiectasis, uncomplicated: Secondary | ICD-10-CM | POA: Diagnosis not present

## 2021-05-16 DIAGNOSIS — J849 Interstitial pulmonary disease, unspecified: Secondary | ICD-10-CM | POA: Diagnosis not present

## 2021-05-16 DIAGNOSIS — J439 Emphysema, unspecified: Secondary | ICD-10-CM | POA: Diagnosis not present

## 2021-05-16 DIAGNOSIS — I700 Atherosclerosis of aorta: Secondary | ICD-10-CM | POA: Diagnosis not present

## 2021-05-22 ENCOUNTER — Ambulatory Visit: Payer: Medicare HMO | Admitting: Internal Medicine

## 2021-05-22 ENCOUNTER — Encounter: Payer: Self-pay | Admitting: Internal Medicine

## 2021-05-22 ENCOUNTER — Other Ambulatory Visit: Payer: Self-pay

## 2021-05-22 VITALS — BP 96/60 | HR 61 | Ht 70.0 in | Wt 166.4 lb

## 2021-05-22 DIAGNOSIS — R001 Bradycardia, unspecified: Secondary | ICD-10-CM | POA: Diagnosis not present

## 2021-05-22 DIAGNOSIS — I4949 Other premature depolarization: Secondary | ICD-10-CM

## 2021-05-22 NOTE — Progress Notes (Signed)
OFFICE CONSULT NOTE  Chief Complaint:  Bradycardia, ectopic beats  Primary Care Physician: Jason Amel, MD  HPI:  Jason [name] is a 80 y.o. male who is being seen today for the evaluation of bradycardia and ectopic beats at the request of Koirala, Dibas, MD. This is a pleasant 80 year old male who is extremely hard of hearing, having a history of COPD, BPH, hyperlipidemia, hypertension, history of pulmonary embolism and irregular heartbeat in the past.  He was referred for ectopic beats noted on his EKG as well as bradycardia.  Jason [name] had noted that at 1 point his apple watch indicated heart rate was down in the 40s.  He remained asymptomatic for this.  In fact he denies any chest pain or shortness of breath.  He says he walks up to 4 miles a day without symptoms.  He also does a paddle boat fishing.  He denies any syncope or presyncope, dizziness or other symptoms associated with bradycardia.  He noted on his apple watch that his heart rate does increase with exercise.  PMHx:  Past Medical History:  Diagnosis Date   Anticoagulated    BPH (benign prostatic hyperplasia)    Chronic respiratory failure (HCC)    Colon polyps    COPD (chronic obstructive pulmonary disease) (HCC)    Ectopic beats    GERD (gastroesophageal reflux disease)    Hypercholesteremia    Hypercoagulable state ([place])    Hyperlipidemia    Hypertension    Insomnia    Irregular heart beat    Lesion of vertebra    Liver lesion    Pulmonary embolism ([place])    Pulmonary nodule     Past Surgical History:  Procedure Laterality Date   ESOPHAGOGASTRODUODENOSCOPY (EGD) WITH PROPOFOL N/A 12/02/2020   Procedure: ESOPHAGOGASTRODUODENOSCOPY (EGD) WITH PROPOFOL;  Surgeon: Wilford Corner, MD;  Location: [place];  Service: Endoscopy;  Laterality: N/A;   NO PAST SURGERIES      FAMHx:  Family History  Problem Relation Age of Onset   CVA Mother    Deep vein thrombosis Mother    CVA Father     SOCHx:    reports that he quit smoking about 10 years ago. His smoking use included cigarettes. He has a 90.00 pack-year smoking history. He has never used smokeless tobacco. He reports previous alcohol use. He reports that he does not use drugs.  ALLERGIES:  No Known Allergies  ROS: Pertinent items noted in HPI and remainder of comprehensive ROS otherwise negative.  HOME MEDS: Current Outpatient Medications on File Prior to Visit  Medication Sig Dispense Refill   acetaminophen (TYLENOL) 650 MG CR tablet Take 650 mg by mouth 2 (two) times daily.     Cholecalciferol (VITAMIN D3) 50 MCG (2000 UT) TABS Take 2,000 Units by mouth daily.     Fluticasone-Salmeterol (ADVAIR) 250-50 MCG/DOSE AEPB Inhale 1 puff into the lungs 2 (two) times daily.     lisinopril-hydrochlorothiazide (ZESTORETIC) 20-25 MG tablet Take 1 tablet by mouth daily.     Misc Natural Products (PROSTATE HEALTH) CAPS Take 1 capsule by mouth daily.     Multiple Vitamins-Minerals (MULTIVITAMIN WITH MINERALS) tablet Take 1 tablet by mouth daily.     Nutritional Supplements (JOINT FORMULA PO) Take 1 tablet by mouth daily.     potassium chloride SA (K-DUR) 20 MEQ tablet Take 2 tablets (40 mEq total) by mouth daily. 7 tablet 0   pravastatin (PRAVACHOL) 80 MG tablet Take 80 mg by mouth every evening.  traZODone (DESYREL) 100 MG tablet Take 100 mg by mouth at bedtime.     XARELTO 20 MG TABS tablet Take 20 mg by mouth daily.     aspirin 325 MG EC tablet Take 325 mg by mouth daily. (Patient not taking: Reported on 05/22/2021)     pantoprazole (PROTONIX) 40 MG tablet Take 1 tablet (40 mg total) by mouth 2 (two) times daily. After 2 months go back to once a day regimen 60 tablet 1   tiotropium (SPIRIVA) 18 MCG inhalation capsule Place 18 mcg into inhaler and inhale daily. (Patient not taking: Reported on 05/22/2021)     No current facility-administered medications on file prior to visit.    LABS/IMAGING: No results found for this or any previous  visit (from the past 48 hour(s)). No results found.  LIPID PANEL:    Component Value Date/Time   TRIG 48 06/21/2019 0300    WEIGHTS: Wt Readings from Last 3 Encounters:  05/22/21 166 lb 6.4 oz (75.5 kg)  12/02/20 216 lb 14.9 oz (98.4 kg)  06/29/20 212 lb (96.2 kg)    VITALS: BP 96/60   Pulse 61   Ht 5\' 10"  (1.778 m)   Wt 166 lb 6.4 oz (75.5 kg)   SpO2 96%   BMI 23.88 kg/m   EXAM: General appearance: alert and no distress Neck: no carotid bruit, no JVD, and thyroid not enlarged, symmetric, no tenderness/mass/nodules Lungs: clear to auscultation bilaterally Heart: regular rate and rhythm Abdomen: soft, non-tender; bowel sounds normal; no masses,  no organomegaly Extremities: extremities normal, atraumatic, no cyanosis or edema Pulses: 2+ and symmetric Skin: Skin color, texture, turgor normal. No rashes or lesions Neurologic: Mental status: Alert, oriented, thought content appropriate, hard of hearing Psych: Pleasant  EKG: Sinus bradycardia with first-degree AV block at 51- personally reviewed  ASSESSMENT: Asymptomatic bradycardia PVCs and PACs COPD Hypertension Dyslipidemia  PLAN: 1.   Jason [name] has multiple cardiovascular risk factors but denies any symptoms of angina or worsening dyspnea on exertion and exercises regularly.  Although he has been noted to have extrasystoles, he is asymptomatic with this.  He was found to have some bradycardia on his apple watch but also has not had symptoms.  EKG shows no evidence of A. fib today.  We discussed options including monitoring or perhaps treadmill stress testing to evaluate for chronotropic competence however again his watch indicates heart rate does go up with exercise.  He did not feel that this was necessary.  I would advise him to continue to monitor for possible symptoms otherwise I do not feel additional testing beyond a basic treadmill test would be warranted at this point. Would recommend avoiding AV nodal blocking  medicines in the future.  Plan follow-up with me as needed.  Thanks again for the kind referral.  Pixie Casino, MD, FACC, [place] Director of the [facility] Diplomate of the American Board of Clinical Lipidology Attending Cardiologist  Direct Dial: [phone]  Fax: [phone]  Website:  [email or web address] Milan 05/22/2021, 1:58 PM

## 2021-05-22 NOTE — Patient Instructions (Addendum)

## 2021-06-02 ENCOUNTER — Inpatient Hospital Stay: Admission: RE | Admit: 2021-06-02 | Payer: Medicare HMO | Source: Ambulatory Visit

## 2021-08-01 DIAGNOSIS — R972 Elevated prostate specific antigen [PSA]: Secondary | ICD-10-CM | POA: Diagnosis not present

## 2021-08-08 DIAGNOSIS — R351 Nocturia: Secondary | ICD-10-CM | POA: Diagnosis not present

## 2021-08-08 DIAGNOSIS — N401 Enlarged prostate with lower urinary tract symptoms: Secondary | ICD-10-CM | POA: Diagnosis not present

## 2021-08-08 DIAGNOSIS — R972 Elevated prostate specific antigen [PSA]: Secondary | ICD-10-CM | POA: Diagnosis not present

## 2021-08-14 ENCOUNTER — Ambulatory Visit
Admission: RE | Admit: 2021-08-14 | Discharge: 2021-08-14 | Disposition: A | Discharge status: Home / Self Care | Payer: Medicare HMO | Source: Ambulatory Visit | Attending: Urology | Admitting: Urology

## 2021-08-14 DIAGNOSIS — R972 Elevated prostate specific antigen [PSA]: Secondary | ICD-10-CM | POA: Diagnosis not present

## 2021-08-14 DIAGNOSIS — N402 Nodular prostate without lower urinary tract symptoms: Secondary | ICD-10-CM | POA: Diagnosis not present

## 2021-08-14 DIAGNOSIS — K5730 Diverticulosis of large intestine without perforation or abscess without bleeding: Secondary | ICD-10-CM | POA: Diagnosis not present

## 2021-08-14 DIAGNOSIS — N3289 Other specified disorders of bladder: Secondary | ICD-10-CM | POA: Diagnosis not present

## 2021-08-14 MED ORDER — GADOBENATE DIMEGLUMINE 529 MG/ML IV SOLN
15.0000 mL | Freq: Once | INTRAVENOUS | Status: AC | PRN
  Administered 2021-08-14: 15 mL via INTRAVENOUS

## 2021-08-23 DIAGNOSIS — J449 Chronic obstructive pulmonary disease, unspecified: Secondary | ICD-10-CM | POA: Diagnosis not present

## 2021-08-23 DIAGNOSIS — N401 Enlarged prostate with lower urinary tract symptoms: Secondary | ICD-10-CM | POA: Diagnosis not present

## 2021-08-23 DIAGNOSIS — I10 Essential (primary) hypertension: Secondary | ICD-10-CM | POA: Diagnosis not present

## 2021-08-23 DIAGNOSIS — E7800 Pure hypercholesterolemia, unspecified: Secondary | ICD-10-CM | POA: Diagnosis not present

## 2021-08-23 DIAGNOSIS — E785 Hyperlipidemia, unspecified: Secondary | ICD-10-CM | POA: Diagnosis not present

## 2021-08-23 DIAGNOSIS — J439 Emphysema, unspecified: Secondary | ICD-10-CM | POA: Diagnosis not present

## 2021-08-23 DIAGNOSIS — G4700 Insomnia, unspecified: Secondary | ICD-10-CM | POA: Diagnosis not present

## 2021-09-05 DIAGNOSIS — Z01818 Encounter for other preprocedural examination: Secondary | ICD-10-CM | POA: Diagnosis not present

## 2021-09-05 DIAGNOSIS — Z86711 Personal history of pulmonary embolism: Secondary | ICD-10-CM | POA: Diagnosis not present

## 2021-09-05 DIAGNOSIS — R972 Elevated prostate specific antigen [PSA]: Secondary | ICD-10-CM | POA: Diagnosis not present

## 2021-09-05 DIAGNOSIS — D6859 Other primary thrombophilia: Secondary | ICD-10-CM | POA: Diagnosis not present

## 2021-09-14 DIAGNOSIS — R972 Elevated prostate specific antigen [PSA]: Secondary | ICD-10-CM | POA: Diagnosis not present

## 2021-09-14 DIAGNOSIS — R351 Nocturia: Secondary | ICD-10-CM | POA: Diagnosis not present

## 2021-09-14 DIAGNOSIS — N401 Enlarged prostate with lower urinary tract symptoms: Secondary | ICD-10-CM | POA: Diagnosis not present

## 2021-09-23 IMAGING — CT CT CHEST HIGH RESOLUTION W/O CM
2 of 7 series · 14 of 36 positions shown, 17 images · non-contrast
Comparison: Chest CT 03/27/2020.

CLINICAL DATA: 80-year-old male with history of post COVID
fibrosis. Follow-up evaluation.

EXAM:
CT CHEST WITHOUT CONTRAST
TECHNIQUE: Multidetector CT imaging of the chest was performed following the
standard protocol without intravenous contrast. High resolution
imaging of the lungs, as well as inspiratory and expiratory imaging,
was performed.

[Series 4: high resolution · axial · 0.69mm/px · z∈[-346,-68]mm · 11 of 333 slices shown, 14 images]
[im 28/333  mediastinal]
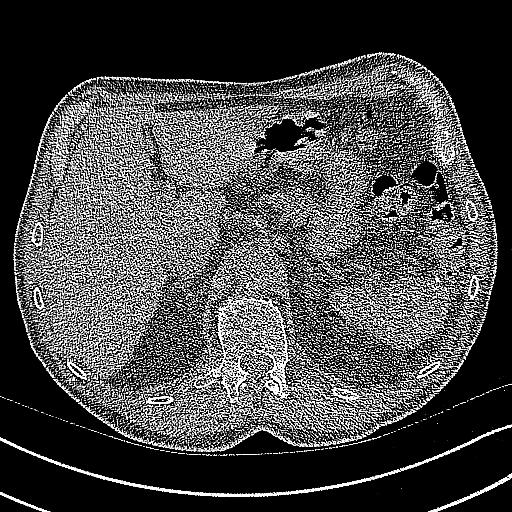
[im 28/333  lung]
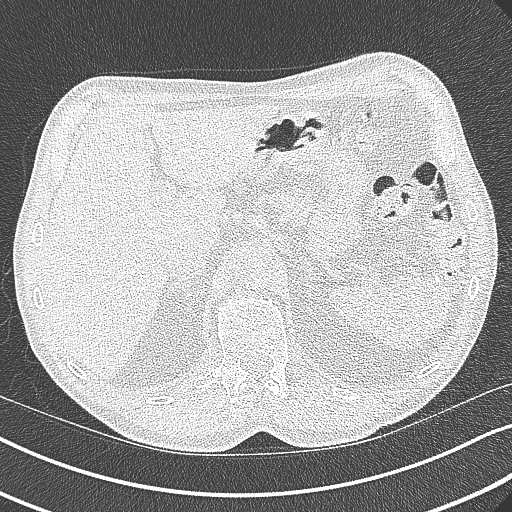
[im 56/333  lung]
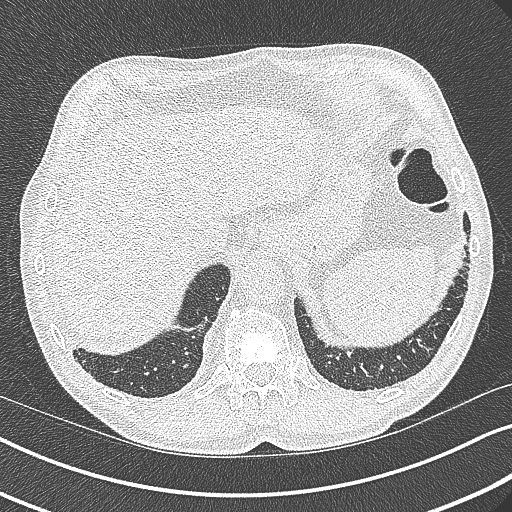
[im 84/333  lung]
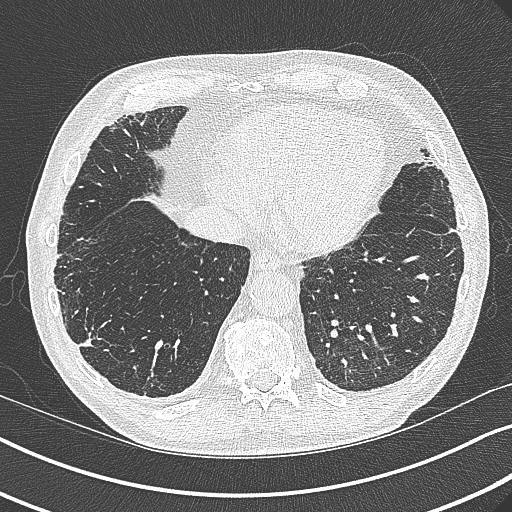
[im 111/333  lung]
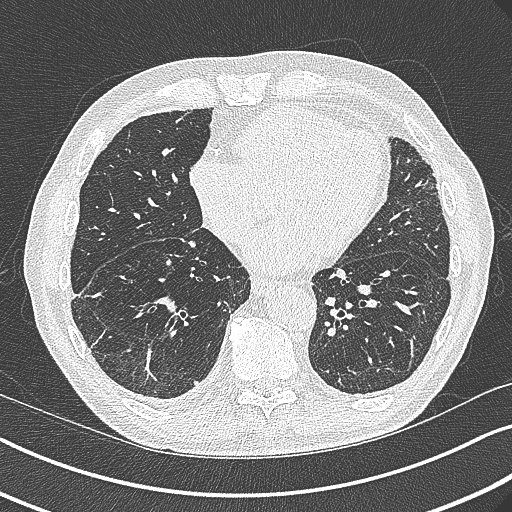
[im 139/333  mediastinal]
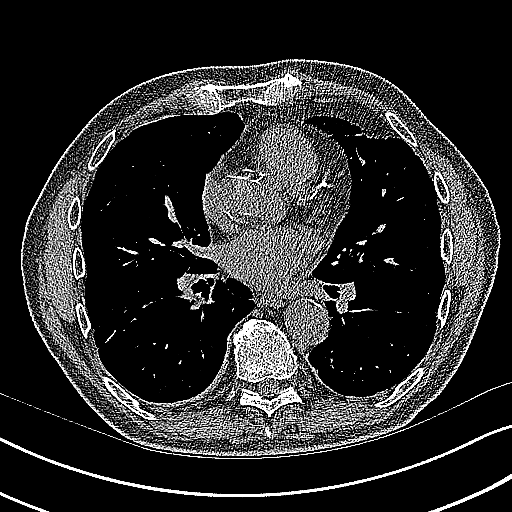
[im 139/333  lung]
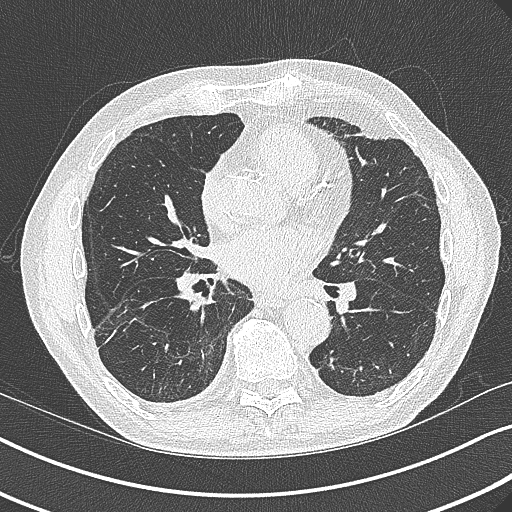
[im 167/333  lung]
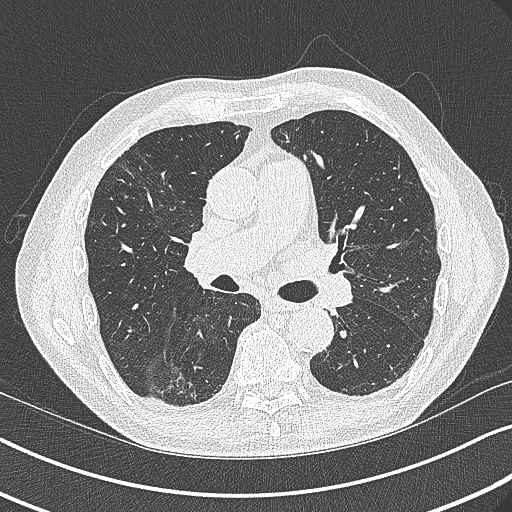
[im 194/333  lung]
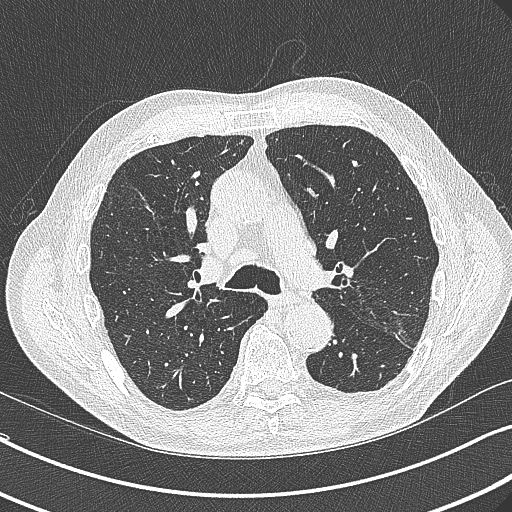
[im 222/333  lung]
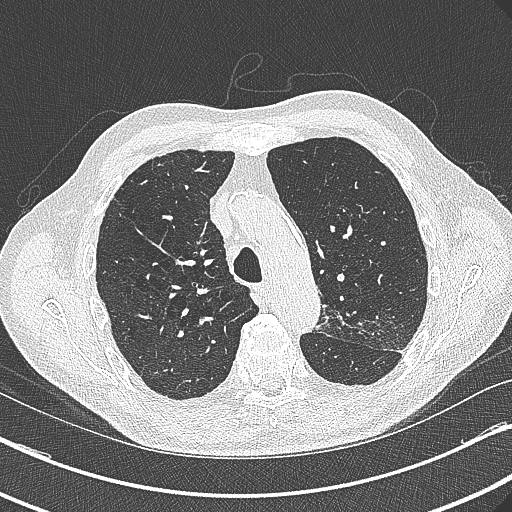
[im 250/333  mediastinal]
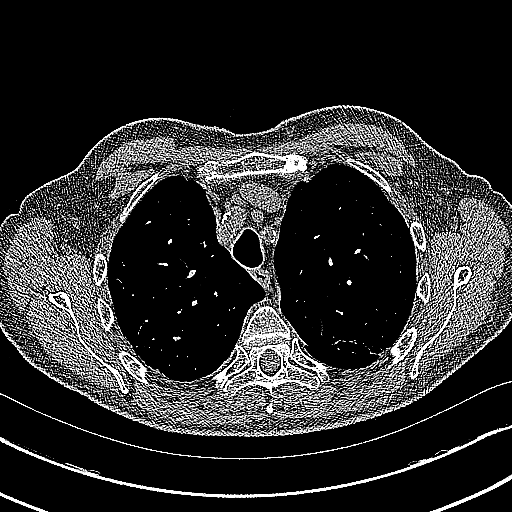
[im 250/333  lung]
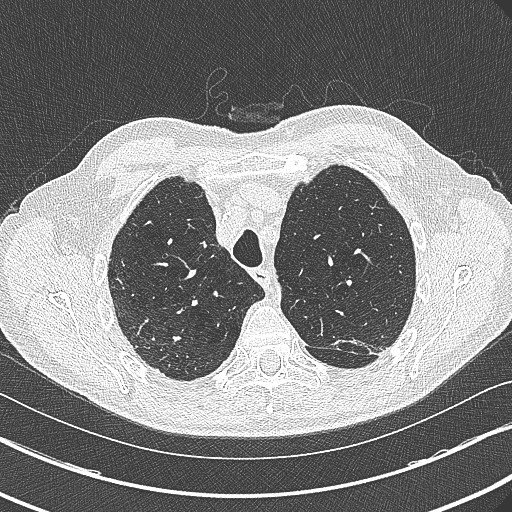
[im 277/333  lung]
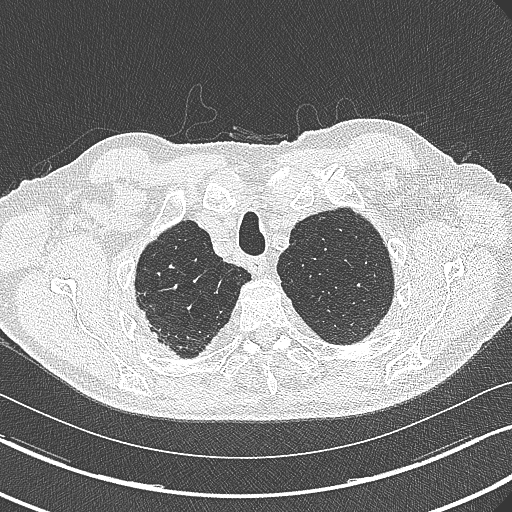
[im 305/333  lung]
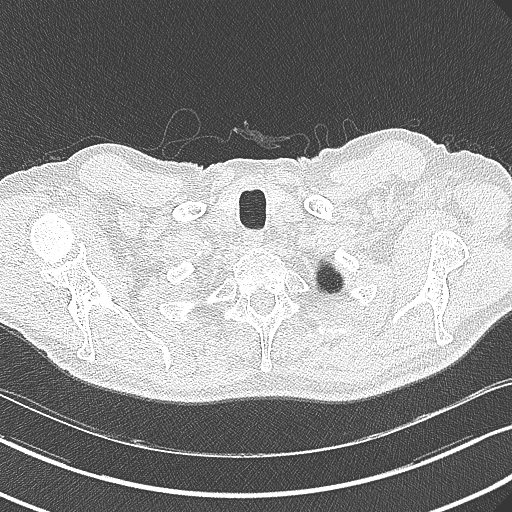

[Series 6: coronal · coronal · 0.68mm/px · 3 of 132 slices shown]
[im 27/132  lung]
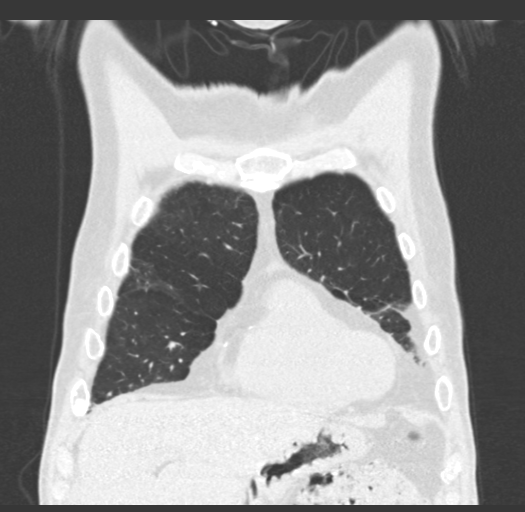
[im 53/132  lung]
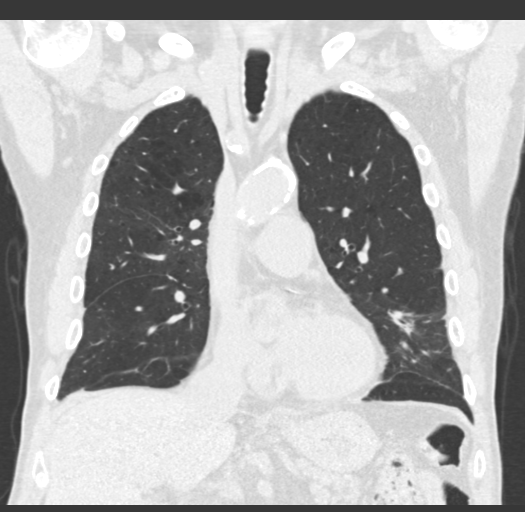
[im 79/132  lung]
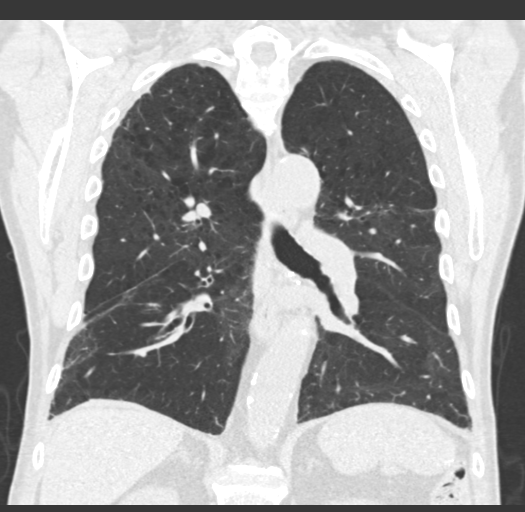

[14 of 36 positions shown; findings below may reference images not displayed]

FINDINGS: Cardiovascular: Heart size is normal. There is no significant
pericardial fluid, thickening or pericardial calcification. There is
aortic atherosclerosis, as well as atherosclerosis of the great
vessels of the mediastinum and the coronary arteries, including
calcified atherosclerotic plaque in the left main, left anterior
descending, left circumflex and right coronary arteries. Mild
calcifications of the aortic valve. Mild dilatation of the pulmonic
trunk (3.5 cm in diameter).

Mediastinum/Nodes: No pathologically enlarged mediastinal or hilar
lymph nodes. Please note that accurate exclusion of hilar adenopathy
is limited on noncontrast CT scans. Esophagus is unremarkable in
appearance. No axillary lymphadenopathy.

Lungs/Pleura: High-resolution images again demonstrate patchy areas
of ground-glass attenuation, septal thickening, thickening of the
peribronchovascular interstitium and some regional architectural
distortion with scattered areas of mild cylindrical bronchiectasis
and peripheral bronchiolectasis. These findings have no discernible
craniocaudal gradient. Some areas demonstrate sparing of the
subpleural interstitium. No honeycombing. Inspiratory and expiratory
imaging demonstrates some very mild air trapping indicative of mild
small airways disease. Diffuse bronchial wall thickening with mild
centrilobular and paraseptal emphysema is also noted. Multiple
scattered tiny pulmonary nodules measuring 5 mm or less in size are
noted and stable compared to prior examinations, considered benign.
Calcified granuloma in the left lower lobe. No other larger more
suspicious appearing pulmonary nodules or masses are noted.

Upper Abdomen: Calcified granulomas throughout the spleen. Aortic
atherosclerosis.

Musculoskeletal: Multiple sclerotic lesions are again noted in the
visualized skeleton, largest of which is at T12 where there is a
mixed lytic and sclerotic lesion measuring approximately 2.7 cm in
diameter, similar to prior studies, previously demonstrated to be
non hypermetabolic on prior PET-CT 09/27/2019. No new bony lesions
are otherwise noted.
IMPRESSION: 1. The fibrotic changes in the lungs are stable compared to prior
examinations, once again most compatible with an alternative
diagnosis (not usual interstitial pneumonia) per ATS guidelines,
presumably post infectious fibrosis related prior COVID infection.
2. Stable pulmonary nodules measuring 5 mm or less in size,
considered benign.
3. Mild air trapping indicative of small airways disease.
4. Mild diffuse bronchial wall thickening with mild centrilobular
and paraseptal emphysema; imaging findings suggestive of underlying
COPD.
5. Mild dilatation of the pulmonic trunk (3.5 cm in diameter),
concerning for pulmonary arterial hypertension.
6. Sclerotic lesions throughout the visualized osseous structures,
similar to prior studies, suspicious for metastatic disease, but
previously demonstrated to be non hypermetabolic on prior PET-CT
09/27/2019. No new osseous lesions are noted.
7. Aortic atherosclerosis, in addition to left main and 3 vessel
coronary artery disease.
8. There are calcifications of the aortic valve. Echocardiographic
correlation for evaluation of potential valvular dysfunction may be
warranted if clinically indicated.

Aortic Atherosclerosis (7X9ZA-9GJ.J) and Emphysema ([id number].E).

## 2021-10-01 DIAGNOSIS — G4700 Insomnia, unspecified: Secondary | ICD-10-CM | POA: Diagnosis not present

## 2021-10-01 DIAGNOSIS — E785 Hyperlipidemia, unspecified: Secondary | ICD-10-CM | POA: Diagnosis not present

## 2021-10-01 DIAGNOSIS — N401 Enlarged prostate with lower urinary tract symptoms: Secondary | ICD-10-CM | POA: Diagnosis not present

## 2021-10-01 DIAGNOSIS — I10 Essential (primary) hypertension: Secondary | ICD-10-CM | POA: Diagnosis not present

## 2021-10-01 DIAGNOSIS — J449 Chronic obstructive pulmonary disease, unspecified: Secondary | ICD-10-CM | POA: Diagnosis not present

## 2021-10-01 DIAGNOSIS — E7800 Pure hypercholesterolemia, unspecified: Secondary | ICD-10-CM | POA: Diagnosis not present

## 2021-10-01 DIAGNOSIS — J439 Emphysema, unspecified: Secondary | ICD-10-CM | POA: Diagnosis not present

## 2021-12-07 DIAGNOSIS — E785 Hyperlipidemia, unspecified: Secondary | ICD-10-CM | POA: Diagnosis not present

## 2021-12-07 DIAGNOSIS — N401 Enlarged prostate with lower urinary tract symptoms: Secondary | ICD-10-CM | POA: Diagnosis not present

## 2021-12-07 DIAGNOSIS — I10 Essential (primary) hypertension: Secondary | ICD-10-CM | POA: Diagnosis not present

## 2021-12-07 DIAGNOSIS — J439 Emphysema, unspecified: Secondary | ICD-10-CM | POA: Diagnosis not present

## 2021-12-14 DIAGNOSIS — R972 Elevated prostate specific antigen [PSA]: Secondary | ICD-10-CM | POA: Diagnosis not present

## 2021-12-25 DIAGNOSIS — R972 Elevated prostate specific antigen [PSA]: Secondary | ICD-10-CM | POA: Diagnosis not present

## 2021-12-25 DIAGNOSIS — N401 Enlarged prostate with lower urinary tract symptoms: Secondary | ICD-10-CM | POA: Diagnosis not present

## 2021-12-25 DIAGNOSIS — R9349 Abnormal radiologic findings on diagnostic imaging of other urinary organs: Secondary | ICD-10-CM | POA: Diagnosis not present

## 2021-12-25 DIAGNOSIS — R351 Nocturia: Secondary | ICD-10-CM | POA: Diagnosis not present

## 2021-12-31 DIAGNOSIS — E7800 Pure hypercholesterolemia, unspecified: Secondary | ICD-10-CM | POA: Diagnosis not present

## 2021-12-31 DIAGNOSIS — Z79899 Other long term (current) drug therapy: Secondary | ICD-10-CM | POA: Diagnosis not present

## 2022-01-02 DIAGNOSIS — I10 Essential (primary) hypertension: Secondary | ICD-10-CM | POA: Diagnosis not present

## 2022-01-02 DIAGNOSIS — Z0001 Encounter for general adult medical examination with abnormal findings: Secondary | ICD-10-CM | POA: Diagnosis not present

## 2022-01-02 DIAGNOSIS — I4949 Other premature depolarization: Secondary | ICD-10-CM | POA: Diagnosis not present

## 2022-01-02 DIAGNOSIS — J9611 Chronic respiratory failure with hypoxia: Secondary | ICD-10-CM | POA: Diagnosis not present

## 2022-01-02 DIAGNOSIS — D6859 Other primary thrombophilia: Secondary | ICD-10-CM | POA: Diagnosis not present

## 2022-01-02 DIAGNOSIS — E7800 Pure hypercholesterolemia, unspecified: Secondary | ICD-10-CM | POA: Diagnosis not present

## 2022-01-02 DIAGNOSIS — Z86711 Personal history of pulmonary embolism: Secondary | ICD-10-CM | POA: Diagnosis not present

## 2022-01-02 DIAGNOSIS — J439 Emphysema, unspecified: Secondary | ICD-10-CM | POA: Diagnosis not present

## 2022-01-02 DIAGNOSIS — G4700 Insomnia, unspecified: Secondary | ICD-10-CM | POA: Diagnosis not present

## 2022-04-03 DIAGNOSIS — M545 Low back pain, unspecified: Secondary | ICD-10-CM | POA: Diagnosis not present

## 2022-04-03 DIAGNOSIS — F1010 Alcohol abuse, uncomplicated: Secondary | ICD-10-CM | POA: Diagnosis not present

## 2022-04-03 DIAGNOSIS — Z79899 Other long term (current) drug therapy: Secondary | ICD-10-CM | POA: Diagnosis not present

## 2022-04-03 DIAGNOSIS — F419 Anxiety disorder, unspecified: Secondary | ICD-10-CM | POA: Diagnosis not present

## 2022-04-05 ENCOUNTER — Inpatient Hospital Stay (HOSPITAL_COMMUNITY)
Admission: EM | Admit: 2022-04-05 | Discharge: 2022-04-09 | DRG: 442 | Disposition: A | Discharge status: Home Health | Payer: Medicare HMO | Attending: Internal Medicine | Admitting: Internal Medicine

## 2022-04-05 ENCOUNTER — Other Ambulatory Visit: Payer: Self-pay

## 2022-04-05 ENCOUNTER — Emergency Department (HOSPITAL_COMMUNITY): Payer: Medicare HMO

## 2022-04-05 ENCOUNTER — Encounter (HOSPITAL_COMMUNITY): Payer: Self-pay

## 2022-04-05 DIAGNOSIS — I700 Atherosclerosis of aorta: Secondary | ICD-10-CM | POA: Diagnosis present

## 2022-04-05 DIAGNOSIS — F1010 Alcohol abuse, uncomplicated: Secondary | ICD-10-CM | POA: Diagnosis not present

## 2022-04-05 DIAGNOSIS — Z7141 Alcohol abuse counseling and surveillance of alcoholic: Secondary | ICD-10-CM

## 2022-04-05 DIAGNOSIS — K760 Fatty (change of) liver, not elsewhere classified: Secondary | ICD-10-CM | POA: Diagnosis present

## 2022-04-05 DIAGNOSIS — Z87891 Personal history of nicotine dependence: Secondary | ICD-10-CM

## 2022-04-05 DIAGNOSIS — Z79891 Long term (current) use of opiate analgesic: Secondary | ICD-10-CM | POA: Diagnosis not present

## 2022-04-05 DIAGNOSIS — B179 Acute viral hepatitis, unspecified: Secondary | ICD-10-CM | POA: Diagnosis present

## 2022-04-05 DIAGNOSIS — I10 Essential (primary) hypertension: Secondary | ICD-10-CM | POA: Diagnosis not present

## 2022-04-05 DIAGNOSIS — I2694 Multiple subsegmental pulmonary emboli without acute cor pulmonale: Secondary | ICD-10-CM | POA: Diagnosis not present

## 2022-04-05 DIAGNOSIS — R000 Tachycardia, unspecified: Secondary | ICD-10-CM | POA: Diagnosis not present

## 2022-04-05 DIAGNOSIS — K219 Gastro-esophageal reflux disease without esophagitis: Secondary | ICD-10-CM | POA: Diagnosis present

## 2022-04-05 DIAGNOSIS — H9190 Unspecified hearing loss, unspecified ear: Secondary | ICD-10-CM | POA: Diagnosis present

## 2022-04-05 DIAGNOSIS — R791 Abnormal coagulation profile: Secondary | ICD-10-CM | POA: Diagnosis present

## 2022-04-05 DIAGNOSIS — G4700 Insomnia, unspecified: Secondary | ICD-10-CM | POA: Diagnosis present

## 2022-04-05 DIAGNOSIS — Z7982 Long term (current) use of aspirin: Secondary | ICD-10-CM | POA: Diagnosis not present

## 2022-04-05 DIAGNOSIS — J449 Chronic obstructive pulmonary disease, unspecified: Secondary | ICD-10-CM | POA: Diagnosis not present

## 2022-04-05 DIAGNOSIS — I959 Hypotension, unspecified: Secondary | ICD-10-CM | POA: Diagnosis present

## 2022-04-05 DIAGNOSIS — Z7901 Long term (current) use of anticoagulants: Secondary | ICD-10-CM

## 2022-04-05 DIAGNOSIS — N400 Enlarged prostate without lower urinary tract symptoms: Secondary | ICD-10-CM | POA: Diagnosis present

## 2022-04-05 DIAGNOSIS — Z7951 Long term (current) use of inhaled steroids: Secondary | ICD-10-CM

## 2022-04-05 DIAGNOSIS — N179 Acute kidney failure, unspecified: Secondary | ICD-10-CM | POA: Diagnosis present

## 2022-04-05 DIAGNOSIS — D6959 Other secondary thrombocytopenia: Secondary | ICD-10-CM | POA: Diagnosis not present

## 2022-04-05 DIAGNOSIS — Z86711 Personal history of pulmonary embolism: Secondary | ICD-10-CM

## 2022-04-05 DIAGNOSIS — R911 Solitary pulmonary nodule: Secondary | ICD-10-CM | POA: Diagnosis present

## 2022-04-05 DIAGNOSIS — D689 Coagulation defect, unspecified: Secondary | ICD-10-CM | POA: Diagnosis not present

## 2022-04-05 DIAGNOSIS — E7800 Pure hypercholesterolemia, unspecified: Secondary | ICD-10-CM | POA: Diagnosis present

## 2022-04-05 DIAGNOSIS — Z789 Other specified health status: Secondary | ICD-10-CM | POA: Diagnosis not present

## 2022-04-05 DIAGNOSIS — Z7289 Other problems related to lifestyle: Secondary | ICD-10-CM | POA: Diagnosis not present

## 2022-04-05 DIAGNOSIS — Z79899 Other long term (current) drug therapy: Secondary | ICD-10-CM

## 2022-04-05 DIAGNOSIS — K7200 Acute and subacute hepatic failure without coma: Principal | ICD-10-CM | POA: Diagnosis present

## 2022-04-05 DIAGNOSIS — R7401 Elevation of levels of liver transaminase levels: Secondary | ICD-10-CM | POA: Diagnosis present

## 2022-04-05 DIAGNOSIS — R945 Abnormal results of liver function studies: Secondary | ICD-10-CM | POA: Diagnosis not present

## 2022-04-05 DIAGNOSIS — K5730 Diverticulosis of large intestine without perforation or abscess without bleeding: Secondary | ICD-10-CM | POA: Diagnosis not present

## 2022-04-05 DIAGNOSIS — I2699 Other pulmonary embolism without acute cor pulmonale: Secondary | ICD-10-CM | POA: Diagnosis present

## 2022-04-05 DIAGNOSIS — E871 Hypo-osmolality and hyponatremia: Secondary | ICD-10-CM | POA: Diagnosis not present

## 2022-04-05 LAB — HEPATITIS PANEL, ACUTE
HCV Ab: NONREACTIVE
Hep A IgM: NONREACTIVE
Hep B C IgM: NONREACTIVE
Hepatitis B Surface Ag: NONREACTIVE

## 2022-04-05 LAB — CBC WITH DIFFERENTIAL/PLATELET
Abs Immature Granulocytes: 0.05 K/uL (ref 0.00–0.07)
Basophils Absolute: 0 K/uL (ref 0.0–0.1)
Basophils Relative: 1 %
Eosinophils Absolute: 0.2 K/uL (ref 0.0–0.5)
Eosinophils Relative: 3 %
HCT: 43.9 % (ref 39.0–52.0)
Hemoglobin: 14.2 g/dL (ref 13.0–17.0)
Immature Granulocytes: 1 %
Lymphocytes Relative: 8 %
Lymphs Abs: 0.5 K/uL — ABNORMAL LOW (ref 0.7–4.0)
MCH: 32.8 pg (ref 26.0–34.0)
MCHC: 32.3 g/dL (ref 30.0–36.0)
MCV: 101.4 fL — ABNORMAL HIGH (ref 80.0–100.0)
Monocytes Absolute: 0.2 K/uL (ref 0.1–1.0)
Monocytes Relative: 4 %
Neutro Abs: 4.7 K/uL (ref 1.7–7.7)
Neutrophils Relative %: 83 %
Platelets: UNDETERMINED K/uL (ref 150–400)
RBC: 4.33 MIL/uL (ref 4.22–5.81)
RDW: 18.1 % — ABNORMAL HIGH (ref 11.5–15.5)
WBC: 5.7 K/uL (ref 4.0–10.5)
nRBC: 0 % (ref 0.0–0.2)

## 2022-04-05 LAB — COMPREHENSIVE METABOLIC PANEL
ALT: 4779 U/L — ABNORMAL HIGH (ref 0–44)
AST: 4675 U/L — ABNORMAL HIGH (ref 15–41)
Albumin: 3 g/dL — ABNORMAL LOW (ref 3.5–5.0)
Alkaline Phosphatase: 78 U/L (ref 38–126)
Anion gap: 11 (ref 5–15)
BUN: 35 mg/dL — ABNORMAL HIGH (ref 8–23)
CO2: 21 mmol/L — ABNORMAL LOW (ref 22–32)
Calcium: 8.3 mg/dL — ABNORMAL LOW (ref 8.9–10.3)
Chloride: 102 mmol/L (ref 98–111)
Creatinine, Ser: 1.42 mg/dL — ABNORMAL HIGH (ref 0.61–1.24)
GFR, Estimated: 49 mL/min — ABNORMAL LOW (ref >60)
Glucose, Bld: 114 mg/dL — ABNORMAL HIGH (ref 70–99)
Potassium: 4 mmol/L (ref 3.5–5.1)
Sodium: 134 mmol/L — ABNORMAL LOW (ref 135–145)
Total Bilirubin: 1.6 mg/dL — ABNORMAL HIGH (ref 0.3–1.2)
Total Protein: 5.6 g/dL — ABNORMAL LOW (ref 6.5–8.1)

## 2022-04-05 LAB — TYPE AND SCREEN
ABO/RH(D): A POS
Antibody Screen: NEGATIVE

## 2022-04-05 LAB — COMPREHENSIVE METABOLIC PANEL WITH GFR
ALT: 6582 U/L — ABNORMAL HIGH (ref 0–44)
AST: 7671 U/L — ABNORMAL HIGH (ref 15–41)
Albumin: 3.5 g/dL (ref 3.5–5.0)
Alkaline Phosphatase: 86 U/L (ref 38–126)
Anion gap: 13 (ref 5–15)
BUN: 37 mg/dL — ABNORMAL HIGH (ref 8–23)
CO2: 20 mmol/L — ABNORMAL LOW (ref 22–32)
Calcium: 8.8 mg/dL — ABNORMAL LOW (ref 8.9–10.3)
Chloride: 101 mmol/L (ref 98–111)
Creatinine, Ser: 1.74 mg/dL — ABNORMAL HIGH (ref 0.61–1.24)
GFR, Estimated: 39 mL/min — ABNORMAL LOW
Glucose, Bld: 122 mg/dL — ABNORMAL HIGH (ref 70–99)
Potassium: 4.2 mmol/L (ref 3.5–5.1)
Sodium: 134 mmol/L — ABNORMAL LOW (ref 135–145)
Total Bilirubin: 1.9 mg/dL — ABNORMAL HIGH (ref 0.3–1.2)
Total Protein: 6.6 g/dL (ref 6.5–8.1)

## 2022-04-05 LAB — URINALYSIS, ROUTINE W REFLEX MICROSCOPIC
Bilirubin Urine: NEGATIVE
Glucose, UA: NEGATIVE mg/dL
Hgb urine dipstick: NEGATIVE
Ketones, ur: 5 mg/dL — AB
Leukocytes,Ua: NEGATIVE
Nitrite: NEGATIVE
Protein, ur: NEGATIVE mg/dL
Specific Gravity, Urine: >1.046 — ABNORMAL HIGH (ref 1.005–1.030)
pH: 5 (ref 5.0–8.0)

## 2022-04-05 LAB — ACETAMINOPHEN LEVEL: Acetaminophen (Tylenol), Serum: 29 ug/mL (ref 10–30)

## 2022-04-05 LAB — PROTIME-INR
INR: 6.1 (ref 0.8–1.2)
INR: 5.2 (ref 0.8–1.2)
Prothrombin Time: 53.8 seconds — ABNORMAL HIGH (ref 11.4–15.2)
Prothrombin Time: 47.2 seconds — ABNORMAL HIGH (ref 11.4–15.2)

## 2022-04-05 LAB — ETHANOL: Alcohol, Ethyl (B): <10 mg/dL (ref <10)

## 2022-04-05 LAB — AMMONIA: Ammonia: 33 umol/L (ref 9–35)

## 2022-04-05 MED ORDER — LACTULOSE 10 GM/15ML PO SOLN: 20.0000 g | Freq: Two times a day (BID) | ORAL | Status: DC | PRN

## 2022-04-05 MED ORDER — LACTATED RINGERS IV SOLN: INTRAVENOUS | Status: AC

## 2022-04-05 MED ORDER — DEXTROSE 5 % IV SOLN
10.0000 mg | Freq: Once | INTRAVENOUS | Status: AC
  Administered 2022-04-05: 10 mg via INTRAVENOUS
  Filled 2022-04-05: qty 1

## 2022-04-05 MED ORDER — IOHEXOL 300 MG/ML  SOLN
100.0000 mL | Freq: Once | INTRAMUSCULAR | Status: AC | PRN
  Administered 2022-04-05: 100 mL via INTRAVENOUS

## 2022-04-05 MED ORDER — PROCHLORPERAZINE EDISYLATE 10 MG/2ML IJ SOLN: 5.0000 mg | Freq: Four times a day (QID) | INTRAMUSCULAR | Status: DC | PRN

## 2022-04-05 MED ORDER — OXYCODONE HCL 5 MG PO TABS: 5.0000 mg | ORAL_TABLET | Freq: Four times a day (QID) | ORAL | Status: DC | PRN

## 2022-04-05 MED ORDER — SODIUM CHLORIDE 0.9 % IV BOLUS
500.0000 mL | Freq: Once | INTRAVENOUS | Status: AC
  Administered 2022-04-05: 500 mL via INTRAVENOUS

## 2022-04-05 MED ORDER — PHYTONADIONE 5 MG PO TABS
10.0000 mg | ORAL_TABLET | Freq: Every day | ORAL | Status: AC
  Administered 2022-04-05 – 2022-04-07 (×3): 10 mg via ORAL
  Filled 2022-04-05 (×3): qty 2

## 2022-04-05 NOTE — ED Provider Notes (Signed)
[facility] EMERGENCY DEPARTMENT Provider Note   CSN: [id number] Arrival date & time: 04/05/22  1508     History  Chief Complaint  Patient presents with   Abnormal Lab    Jason [name] is a 81 y.o. male.   Abnormal Lab  Patient with medical history of COPD, prior PE on Xarelto, alcohol use disorder, hypertension, hyperlipidemia presents today due to abnormal LFTs and thrombocytopenia.  Patient was seen at primary's office earlier today and LFTs were found to be greater than 3000 and platelets 40.  Sent to ED for further evaluation.  Has been "feeling off" for about 4 weeks, worsened in the last 3 days per son.  Patient had quit alcohol about a year ago after a GI bleed, unsure when he restarted drinking but he has been drinking "a lot" recently.  The patient denies any black and tarry stool or blood in the stool.  He denies any abdominal pain, fevers, vomiting.  He mostly complains of neck pain and back pain.  This is constant and worse with movement.  Home Medications Prior to Admission medications   Medication Sig Start Date End Date Taking? Authorizing Provider  acetaminophen (TYLENOL) 650 MG CR tablet Take 650 mg by mouth 2 (two) times daily.    [provider]  aspirin 325 MG EC tablet Take 325 mg by mouth daily. Patient not taking: Reported on 05/22/2021    [provider]  Cholecalciferol (VITAMIN D3) 50 MCG (2000 UT) TABS Take 2,000 Units by mouth daily.    [provider]  Fluticasone-Salmeterol (ADVAIR) 250-50 MCG/DOSE AEPB Inhale 1 puff into the lungs 2 (two) times daily.    [provider]  lisinopril-hydrochlorothiazide (ZESTORETIC) 20-25 MG tablet Take 1 tablet by mouth daily. 04/08/19   [provider]  Misc Natural Products ([place]) CAPS Take 1 capsule by mouth daily.    [provider]  Multiple Vitamins-Minerals (MULTIVITAMIN WITH MINERALS) tablet Take 1 tablet by mouth daily.    [provider]  Nutritional Supplements (JOINT FORMULA PO) Take 1 tablet by mouth daily.    [provider]  pantoprazole (PROTONIX) 40 MG tablet Take 1 tablet (40 mg total) by mouth 2 (two) times daily. After 2 months go back to once a day regimen 12/03/20 02/01/21  Antonieta Pert, MD  potassium chloride SA (K-DUR) 20 MEQ tablet Take 2 tablets (40 mEq total) by mouth daily. 04/14/19   Kayleen Memos, DO  pravastatin (PRAVACHOL) 80 MG tablet Take 80 mg by mouth every evening. 04/07/19   [provider]  tiotropium (SPIRIVA) 18 MCG inhalation capsule Place 18 mcg into inhaler and inhale daily. Patient not taking: Reported on 05/22/2021    [provider]  traZODone (DESYREL) 100 MG tablet Take 100 mg by mouth at bedtime. 03/24/19   [provider]  XARELTO 20 MG TABS tablet Take 20 mg by mouth daily.    [provider]      Allergies    Patient has no known allergies.    Review of Systems   Review of Systems  Physical Exam Updated Vital Signs BP 116/65   Pulse 64   Temp (!) 97.5 F (36.4 C) (Oral)   Resp (!) 24   Ht '5\' 10"'$  (1.778 m)   Wt 75.3 kg   SpO2 96%   BMI 23.82 kg/m  Physical Exam Vitals and nursing note reviewed. Exam conducted with a chaperone present.  Constitutional:  Appearance: Normal appearance. He is ill-appearing.     Comments: Very hard of hearing  HENT:     Head: Normocephalic and atraumatic.  Eyes:     General: No scleral icterus.       Right eye: No discharge.        Left eye: No discharge.     Extraocular Movements: Extraocular movements intact.     Pupils: Pupils are equal, round, and reactive to light.  Neck:     Comments: Diffuse cervical spine tenderness but no nuchal rigidity and complete ROM Cardiovascular:     Rate and Rhythm: Regular rhythm. Tachycardia present.     Pulses: Normal pulses.     Heart sounds: Normal heart sounds. No murmur heard.   No friction rub. No gallop.  Pulmonary:     Effort:  Pulmonary effort is normal. No respiratory distress.     Breath sounds: Normal breath sounds.  Abdominal:     General: Abdomen is flat. Bowel sounds are normal. There is distension.     Palpations: Abdomen is soft.     Tenderness: There is no abdominal tenderness.     Comments: Abdomen is soft and nontender  Musculoskeletal:     Cervical back: Normal range of motion. Tenderness present.  Skin:    General: Skin is warm and dry.     Coloration: Skin is not jaundiced.     Findings: Bruising present.  Neurological:     Mental Status: He is alert and oriented to person, place, and time. Mental status is at baseline.     Coordination: Coordination normal.     Comments: Oriented x3    ED Results / Procedures / Treatments   Labs (all labs ordered are listed, but only abnormal results are displayed) Labs Reviewed  COMPREHENSIVE METABOLIC PANEL - Abnormal; Notable for the following components:      Result Value   Sodium 134 (*)    CO2 20 (*)    Glucose, Bld 122 (*)    BUN 37 (*)    Creatinine, Ser 1.74 (*)    Calcium 8.8 (*)    AST 7,671 (*)    ALT 6,582 (*)    Total Bilirubin 1.9 (*)    GFR, Estimated 39 (*)    All other components within normal limits  CBC WITH DIFFERENTIAL/PLATELET - Abnormal; Notable for the following components:   MCV 101.4 (*)    RDW 18.1 (*)    Lymphs Abs 0.5 (*)    All other components within normal limits  PROTIME-INR - Abnormal; Notable for the following components:   Prothrombin Time 53.8 (*)    INR 6.1 (*)    All other components within normal limits  HEPATITIS PANEL, ACUTE  ETHANOL  AMMONIA  ACETAMINOPHEN LEVEL  URINALYSIS, ROUTINE W REFLEX MICROSCOPIC  CBC WITH DIFFERENTIAL/PLATELET  COMPREHENSIVE METABOLIC PANEL  COMPREHENSIVE METABOLIC PANEL  MAGNESIUM  PHOSPHORUS  PROTIME-INR  PROTIME-INR  TYPE AND SCREEN    EKG EKG Interpretation  Date/Time:  Friday April 05 2022 15:18:27 EDT Ventricular Rate:  114 PR Interval:  172 QRS  Duration: 76 QT Interval:  312 QTC Calculation: 430 R Axis:   34 Text Interpretation: Sinus tachycardia Cannot rule out Anterior infarct , age undetermined Abnormal ECG When compared with ECG of 30-Nov-2020 13:02, PREVIOUS ECG IS PRESENT when compar3ed to prior, less wandering baseline and faster rate. No STEMI Confirmed by Antony Blackbird [id number]) on 04/05/2022 3:29:22 PM  Radiology CT Abdomen Pelvis W Contrast  Result Date:  04/05/2022 CLINICAL DATA:  Acute abdominal pain. EXAM: CT ABDOMEN AND PELVIS WITH CONTRAST TECHNIQUE: Multidetector CT imaging of the abdomen and pelvis was performed using the standard protocol following bolus administration of intravenous contrast. RADIATION DOSE REDUCTION: This exam was performed according to the departmental dose-optimization program which includes automated exposure control, adjustment of the mA and/or kV according to patient size and/or use of iterative reconstruction technique. CONTRAST:  160m OMNIPAQUE IOHEXOL 300 MG/ML  SOLN COMPARISON:  CT virtual colonoscopy 02/07/2021. FINDINGS: Lower chest: There is stable atelectasis or scarring in the lung bases. Stable 3 mm nodule in the right lower lobe image 5/37. There is a calcified granuloma in the left lung base. Hepatobiliary: There is diffuse fatty infiltration of the liver. There is focal fat in the region of the gallbladder fossa, unchanged. Small gallstones are likely present. There is no biliary ductal dilatation. Pancreas: Unremarkable. No pancreatic ductal dilatation or surrounding inflammatory changes. Spleen: There are calcified granulomas in the spleen. There is a new peripheral small wedge-shaped hypodensity in the spleen which may represent splenic infarct. This is new from prior. Adrenals/Urinary Tract: Rounded hypodensity in the right kidney is too small to characterize, likely a cyst. Otherwise, the kidneys, adrenal glands and bladder are within normal limits. Stomach/Bowel: Stomach is within normal  limits. Appendix appears normal. No evidence of bowel wall thickening, distention, or inflammatory changes. There is diffuse colonic diverticulosis without evidence for acute diverticulitis. Vascular/Lymphatic: Aortic atherosclerosis. No enlarged abdominal or pelvic lymph nodes. Reproductive: Prostate is unremarkable. Other: Small fat containing left inguinal and umbilical hernias. No ascites. Musculoskeletal: Numerous sclerotic osseous lesions are again seen most significant at T12, similar to prior. There are degenerative changes of the spine. Chronic compression deformity of L1 is unchanged. IMPRESSION: 1. No acute process in the abdomen or pelvis. 2. 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 3. Colonic diverticulosis. 4. Stable sclerotic lesions throughout the osseous structures. 5. Fatty infiltration of the liver. 6.  Aortic Atherosclerosis (ICD10-I70.0). Electronically Signed   By: ARonney AstersM.D.   On: 04/05/2022 17:25    Procedures .Critical Care Performed by: SSherrill Raring PA-C Authorized by: SSherrill Raring PA-C   Critical care provider statement:    Critical care time (minutes):  30   Critical care start time:  04/05/2022 7:11 PM   Critical care end time:  04/05/2022 7:41 AM   Critical care was necessary to treat or prevent imminent or life-threatening deterioration of the following conditions:  Hepatic failure   Critical care was time spent personally by me on the following activities:  Development of treatment plan with patient or surrogate, discussions with consultants, evaluation of patient's response to treatment, examination of patient, ordering and review of laboratory studies, ordering and review of radiographic studies, ordering and performing treatments and interventions, pulse oximetry, re-evaluation of patient's condition and review of old charts   Care discussed with: admitting provider      Medications Ordered in ED Medications  phytonadione (VITAMIN K) 10 mg in dextrose 5 % 50 mL  IVPB (10 mg Intravenous New Bag/Given 04/05/22 1844)  prochlorperazine (COMPAZINE) injection 5 mg (has no administration in time range)  oxyCODONE (Oxy IR/ROXICODONE) immediate release tablet 5 mg (has no administration in time range)  lactulose (CHRONULAC) 10 GM/15ML solution 20 g (has no administration in time range)  sodium chloride 0.9 % bolus 500 mL (0 mLs Intravenous Stopped 04/05/22 1844)  iohexol (OMNIPAQUE) 300 MG/ML solution 100 mL (100 mLs Intravenous Contrast Given 04/05/22 1705)  ED Course/ Medical Decision Making/ A&P                           Medical Decision Making Amount and/or Complexity of Data Reviewed Labs: ordered. Radiology: ordered.  Risk Prescription drug management. Decision regarding hospitalization.   This patient presents to the ED for concern of weakness in the context of LFT elevation and thrombocytopenia, this involves an extensive number of treatment options, and is a complaint that carries with it a high risk of complications and morbidity.  The differential diagnosis includes shock liver, acute liver failure, GIB, gross electrolyte derangement, AKI.   Patient's presentation is complicated by their history of anticoagulation status on Xarelto.  Also history of alcohol use disorder.   Additional history obtained:   Independent historian: son  Reviewed external records including hospitalization in January 2022 patient had a GI bleed and had endoscopy performed at [facility].    Lab Tests:  I ordered, viewed, and personally interpreted labs.  The pertinent results include: No leukocytosis or anemia.  INR is notable for significant elevation of 6.1.  Patient AST and ALT are also significantly elevated, AST 7671, ALT 6582.  Patient has a new AKI of 1.74.  BUN is elevated at 37, slight hyponatremia at 134.  Hepatitis panel is negative, ethanol low.  Acetaminophen and ammonia level low.  UA not yet collected.    Imaging Studies ordered:  I directly  visualized the CT abdomen pelvis with contrast which shows no acute findings that would account for current presentation.  Also no signs of portal vein thrombosis on the scan.  I agree with the radiologist interpretation    ECG/Cardiac monitoring:   Per my interpretation, EKG shows sinus tachycardia  The patient was maintained on a cardiac monitor.  Visualized monitor strip which showed sinus rhythm heart rate 64 per my interpretation.    Medicines ordered and prescription drug management:  I ordered medication including: 500 mL fluid bolus, vitamin K  I have reviewed the patients home medicines and have made adjustments as needed    Consultations Obtained:  I requested consultation with the gastroenterology, [facility] hepatology, hospitalist service.  Discussed lab and imaging findings as well as pertinent plan - they recommend:   spoke with Dr. Deno Etienne with gastroenterology.  She suspects is likely an ischemic colitis, advise holding Xarelto and 10 IV vitamin K.  States will need to repeat INR tomorrow as well as labs trending.  Discussed admission to hospitalist service for supportive care, vitamin K and INR versus possible transfer to hepatology.  Patient is not encephalopathic, he is also advanced age and alcohol use is a comorbidity but if he became encephalopathic there is concern he may need transplant.  For that reason I spoke with [facility] hepatology.  I spoke with Dr. Merrilee Jansky who is a transplant hepatologist, he agrees the differential this is most likely ischemic colitis.  He would recommend obtaining an echocardiogram, does not feel patient would be a transplant candidate and thinks appropriate to keep here at [facility] and continue working up.  Agrees with supportive care, vitamin K.  Appreciate their consultations.  I will consult the hospitalist service.  Reevaluation:  After the interventions noted above, I reevaluated the patient and found no changes in mental status   Problems  addressed / ED Course: Patient will need admission for suspected ischemic hepatitis.   Social Determinants of Health:    Disposition:   After consideration of  the diagnostic results and the patients response to treatment, I feel that the patent would benefit from admission.   Discussed HPI, physical exam and plan of care for this patient with attending Marda Stalker. The attending physician evaluated this patient as part of a shared visit and agrees with plan of care.          Final Clinical Impression(s) / ED Diagnoses Final diagnoses:  Ischemic hepatitis  AKI (acute kidney injury) [facility])    Rx / [facility] Orders ED Discharge Orders     None         Sherrill Raring, [place] 04/05/22 1932    Tegeler, Gwenyth Allegra, MD 04/08/22 [phone]

## 2022-04-05 NOTE — ED Provider Triage Note (Signed)
Emergency Medicine Provider Triage Evaluation Note  Buster Schueller , a 81 y.o. male  was evaluated in triage.  Pt complains of abnormal labs. Pt is hard of hearing.  Was seen by PCP recently and was noted to have thrombocytopenia with platelet in the 40s and transaminitis.  Pt does drink alcohol.  He endorse feeling weak and occasional back and neck pain.  Denies abd pain or sob.  On xarelto due to hx of PE  Review of Systems  Positive: As above Negative: As above  Physical Exam  BP 94/73 (BP Location: Left Arm)   Pulse (!) 116   Temp (!) 97.5 F (36.4 C) (Oral)   Resp 20   Ht '5\' 10"'$  (1.778 m)   Wt 75.3 kg   SpO2 94%   BMI 23.82 kg/m  Gen:   Awake, no distress   Resp:  Normal effort  MSK:   Moves extremities without difficulty  Other:  Hard of hearing.  Bruises noted to arms.    Medical Decision Making  Medically screening exam initiated at 3:13 PM.  Appropriate orders placed.  Jaimon Husmann was informed that the remainder of the evaluation will be completed by another provider, this initial triage assessment does not replace that evaluation, and the importance of remaining in the ED until their evaluation is complete.  Is hypotensive and tachycardic but does not appears to be in any acute distress.  Needs to be evaluated promptly.    Domenic Moras, PA-C 04/05/22 1524

## 2022-04-05 NOTE — H&P (Signed)
History and Physical  Jason [name] XTG:[id number] DOB: 1941-05-12 DOA: 04/05/2022  Referring physician: Ezequiel [name]  PCP: Jason Amel, MD  Outpatient Specialists: [name]. Patient coming from: Home via PCP.  Chief Complaint: Generalized weakness.  Abnormal labs.  HPI: Jason [name] is a 81 y.o. male with medical history significant for pulmonary embolism on Xarelto, essential hypertension, hyperlipidemia, GERD, alcoholism, who presented to [name] at the recommendation of his PCP due to abnormal labs and generalized weakness.  Patient initially went to his PCP due to generalized weakness lasting for the past few weeks.  Labs were drawn and resulted with new severe thrombocytopenia with platelet count in the 40s, and severely elevated LFTs greater than 3000.  Patient was advised to go to the ED for further evaluation and management.  No hematemesis, hematochezia or melena.  Denies overt bleeding.  Last dose of Xarelto was taken this morning.  Upon presentation to the ED, the patient is hypotensive with SBP's in the 80s, improved after 500 cc normal saline IV fluid bolus.  Labs in the ED notable for severely elevated liver chemistries, AST and ALT greater than 6000, INR 6.1.  CT abdomen and pelvis with contrast was unrevealing.  No acute process in the abdomen or pelvis.  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  Colonic diverticulosis.  Fatty infiltration of the liver.  Aortic atherosclerosis.   EDP discussed the case with Jason [name], Jason [name], who recommended admission for supportive care, vitamin K, and to trend INR.  The patient is alert and oriented x4.  No evidence of hepatic encephalopathy.  EDP also discussed the case with hepatology at [name] who recommended supportive care for now since the patient has no encephalopathy.  In the ED the patient received 1 dose of IV vitamin K 10 mg x 1 and NS 500 cc bolus.  EDP requested admission by hospitalist service.  [facility]  ED Course: Tmax 97.5.  BP 116/65,  pulse 64, respiratory rate 24, saturation 96% on room air.  Lab studies significant for serum sodium 134, serum bicarb 20, glucose 122, BUN 37, creatinine 1.74, GFR 39, with baseline creatinine of 1.05 and GFR greater than 60.  AST 7671, ALT 6582, ammonia 33, T. bili 1.9.  Platelet count, platelet clumps noted on smear unable to estimate, recommended repeat to verify.  Review of Systems: Review of systems as noted in the HPI. All other systems reviewed and are negative.   Past Medical History:  Diagnosis Date   Anticoagulated    BPH (benign prostatic hyperplasia)    Chronic respiratory failure (HCC)    Colon polyps    COPD (chronic obstructive pulmonary disease) (HCC)    Ectopic beats    GERD (gastroesophageal reflux disease)    Hypercholesteremia    Hypercoagulable state ([place])    Hyperlipidemia    Hypertension    Insomnia    Irregular heart beat    Lesion of vertebra    Liver lesion    Pulmonary embolism ([place])    Pulmonary nodule    Past Surgical History:  Procedure Laterality Date   ESOPHAGOGASTRODUODENOSCOPY (EGD) WITH PROPOFOL N/A 12/02/2020   Procedure: ESOPHAGOGASTRODUODENOSCOPY (EGD) WITH PROPOFOL;  Surgeon: Wilford Corner, MD;  Location: [place];  Service: Endoscopy;  Laterality: N/A;   NO PAST SURGERIES      Social History:  reports that he quit smoking about 11 years ago. His smoking use included cigarettes. He has a 90.00 pack-year smoking history. He has never used smokeless tobacco. He  reports current alcohol use. He reports that he does not use drugs.   No Known Allergies  Family History  Problem Relation Age of Onset   CVA Mother    Deep vein thrombosis Mother    CVA Father       Prior to Admission medications   Medication Sig Start Date End Date Taking? Authorizing Provider  acetaminophen (TYLENOL) 650 MG CR tablet Take 650 mg by mouth 2 (two) times daily.    [provider]  aspirin 325 MG EC tablet Take 325 mg by mouth daily. Patient  not taking: Reported on 05/22/2021    [provider]  Cholecalciferol (VITAMIN D3) 50 MCG (2000 UT) TABS Take 2,000 Units by mouth daily.    [provider]  Fluticasone-Salmeterol (ADVAIR) 250-50 MCG/DOSE AEPB Inhale 1 puff into the lungs 2 (two) times daily.    [provider]  lisinopril-hydrochlorothiazide (ZESTORETIC) 20-25 MG tablet Take 1 tablet by mouth daily. 04/08/19   [provider]  Misc Natural Products ([place]) CAPS Take 1 capsule by mouth daily.    [provider]  Multiple Vitamins-Minerals (MULTIVITAMIN WITH MINERALS) tablet Take 1 tablet by mouth daily.    [provider]  Nutritional Supplements (JOINT FORMULA PO) Take 1 tablet by mouth daily.    [provider]  pantoprazole (PROTONIX) 40 MG tablet Take 1 tablet (40 mg total) by mouth 2 (two) times daily. After 2 months go back to once a day regimen 12/03/20 02/01/21  Antonieta Pert, MD  potassium chloride SA (K-DUR) 20 MEQ tablet Take 2 tablets (40 mEq total) by mouth daily. 04/14/19   Kayleen Memos, DO  pravastatin (PRAVACHOL) 80 MG tablet Take 80 mg by mouth every evening. 04/07/19   [provider]  tiotropium (SPIRIVA) 18 MCG inhalation capsule Place 18 mcg into inhaler and inhale daily. Patient not taking: Reported on 05/22/2021    [provider]  traZODone (DESYREL) 100 MG tablet Take 100 mg by mouth at bedtime. 03/24/19   [provider]  XARELTO 20 MG TABS tablet Take 20 mg by mouth daily.    [provider]    Physical Exam: BP 116/65   Pulse 64   Temp (!) 97.5 F (36.4 C) (Oral)   Resp (!) 24   Ht '5\' 10"'$  (1.778 m)   Wt 75.3 kg   SpO2 96%   BMI 23.82 kg/m   General: 81 y.o. year-old male well developed well nourished in no acute distress.  Alert and oriented x3. Cardiovascular: Regular rate and rhythm with no rubs or gallops.  No thyromegaly or JVD noted.  No lower extremity edema. 2/4 pulses in all 4  extremities. Respiratory: Clear to auscultation with no wheezes or rales. Good inspiratory effort. Abdomen: Soft nontender nondistended with normal bowel sounds x4 quadrants. Muskuloskeletal: No cyanosis, clubbing or edema noted bilaterally Neuro: CN II-XII intact, strength, sensation, reflexes Skin: Bruising noted in upper extremities bilaterally. Psychiatry: Judgement and insight appear normal. Mood is appropriate for condition and setting          Labs on Admission:  Basic Metabolic Panel: Recent Labs  Lab 04/05/22 1523  NA 134*  K 4.2  CL 101  CO2 20*  GLUCOSE 122*  BUN 37*  CREATININE 1.74*  CALCIUM 8.8*   Liver Function Tests: Recent Labs  Lab 04/05/22 1523  AST 7,671*  ALT 6,582*  ALKPHOS 86  BILITOT 1.9*  PROT 6.6  ALBUMIN 3.5   No results for input(s):  LIPASE, AMYLASE in the last 168 hours. Recent Labs  Lab 04/05/22 1628  AMMONIA 33   CBC: Recent Labs  Lab 04/05/22 1523  WBC 5.7  NEUTROABS 4.7  HGB 14.2  HCT 43.9  MCV 101.4*  PLT PLATELET CLUMPS NOTED ON SMEAR, UNABLE TO ESTIMATE   Cardiac Enzymes: No results for input(s): CKTOTAL, CKMB, CKMBINDEX, TROPONINI in the last 168 hours.  BNP (last 3 results) No results for input(s): BNP in the last 8760 hours.  ProBNP (last 3 results) No results for input(s): PROBNP in the last 8760 hours.  CBG: No results for input(s): GLUCAP in the last 168 hours.  Radiological Exams on Admission: CT Abdomen Pelvis W Contrast  Result Date: 04/05/2022 CLINICAL DATA:  Acute abdominal pain. EXAM: CT ABDOMEN AND PELVIS WITH CONTRAST TECHNIQUE: Multidetector CT imaging of the abdomen and pelvis was performed using the standard protocol following bolus administration of intravenous contrast. RADIATION DOSE REDUCTION: This exam was performed according to the departmental dose-optimization program which includes automated exposure control, adjustment of the mA and/or kV according to patient size and/or use of iterative  reconstruction technique. CONTRAST:  121m OMNIPAQUE IOHEXOL 300 MG/ML  SOLN COMPARISON:  CT virtual colonoscopy 02/07/2021. FINDINGS: Lower chest: There is stable atelectasis or scarring in the lung bases. Stable 3 mm nodule in the right lower lobe image 5/37. There is a calcified granuloma in the left lung base. Hepatobiliary: There is diffuse fatty infiltration of the liver. There is focal fat in the region of the gallbladder fossa, unchanged. Small gallstones are likely present. There is no biliary ductal dilatation. Pancreas: Unremarkable. No pancreatic ductal dilatation or surrounding inflammatory changes. Spleen: There are calcified granulomas in the spleen. There is a new peripheral small wedge-shaped hypodensity in the spleen which may represent splenic infarct. This is new from prior. Adrenals/Urinary Tract: Rounded hypodensity in the right kidney is too small to characterize, likely a cyst. Otherwise, the kidneys, adrenal glands and bladder are within normal limits. Stomach/Bowel: Stomach is within normal limits. Appendix appears normal. No evidence of bowel wall thickening, distention, or inflammatory changes. There is diffuse colonic diverticulosis without evidence for acute diverticulitis. Vascular/Lymphatic: Aortic atherosclerosis. No enlarged abdominal or pelvic lymph nodes. Reproductive: Prostate is unremarkable. Other: Small fat containing left inguinal and umbilical hernias. No ascites. Musculoskeletal: Numerous sclerotic osseous lesions are again seen most significant at T12, similar to prior. There are degenerative changes of the spine. Chronic compression deformity of L1 is unchanged. IMPRESSION: 1. No acute process in the abdomen or pelvis. 2. 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 3. Colonic diverticulosis. 4. Stable sclerotic lesions throughout the osseous structures. 5. Fatty infiltration of the liver. 6.  Aortic Atherosclerosis (ICD10-I70.0). Electronically Signed   By: ARonney AstersM.D.    On: 04/05/2022 17:25    EKG: I independently viewed the EKG done and my findings are as followed: Sinus tachycardia rate of 114.  Nonspecific ST-T changes.  QTc 434.  Assessment/Plan Present on Admission:  Acute hepatitis  Principal Problem:   Acute hepatitis  Acute hepatitis, unclear etiology. Presented with generalized weakness, AST 7671, ALT 6582, T. bili 1.9. INR 6.1 Ammonia WNL 33-no encephalopathy Acute hepatitis virus panel negative. CT abdomen pelvis with contrast was unrevealing for any acute intra-abdominal pelvic findings. Received NS 500 cc x 1, 10 mg of IV vitamin K. [name] consulted by EDP.  Defer further work-up to [name]. Continue supportive care  Coagulopathy in the setting of acute hepatitis. INR 6.1 Received 1 dose of  vitamin K IV 10 mg x 1 Trend INR Repeat vitamin K p.o. 10 mg daily x3 days and closely monitor for overt bleeding Hold off antiplatelets and anticoagulation, last dose of Xarelto was this morning.  AKI, likely prerenal Baseline creatinine appears to be 1.0 with GFR greater than 60 Presented with creatinine 1.74 with GFR of 39 Gentle IV fluid hydration LR 50 cc/h x 2 days Monitor urine output Avoid nephrotoxic agents, dehydration and hypotension  History of pulmonary embolism on Xarelto Hold off anticoagulation, home Xarelto, in the setting of coagulopathy Closely monitor on telemetry  Hypovolemic hyponatremia Serum sodium 134 Gentle IV fluid hydration Repeat chemistry panel in the morning.  Hypertension, currently hypotensive on presentation Hold off home oral antihypertensives Received normal saline bolus 500 cc x 1 with improvement of his hypotension. Maintain MAP greater than 65 to avoid worsening of AKI  GERD Hold off home PPI as it can exacerbate hepatic failure.  Hyperlipidemia Hold off home statin due to severely elevated liver chemistries  Alcohol use disorder Reports last alcohol intake, liquor was 5 days ago. No  evidence of alcohol withdrawal at the time of the visit.    Critical care time: 65 minutes.    DVT prophylaxis: SCDs  Code Status: Full code  Family Communication: His son at bedside  Disposition Plan: Admitted to progressive unit  Consults called: Jason [name] consulted by EDP.  Admission status: Inpatient status   Status is: Inpatient The patient requires at least 2 midnights for further evaluation and treatment of present condition.   Kayleen Memos MD [facility] Pager [phone]  If 7PM-7AM, please contact night-coverage [email or web address] Password [facility]  04/05/2022, 7:28 PM

## 2022-04-05 NOTE — ED Triage Notes (Addendum)
Patient see by PCP and called due to platelets in the 40's and LFTs >3000.  [facility] called and said they were sending labs.  Unsure if alcohol related or something else going on with the liver. Patient having generalized weakness, back pain and neck pain.  Patient is tachy and hypotensive in triage. Initial pressure in triage was in the 80's.  Repeat was 94/73

## 2022-04-06 DIAGNOSIS — K7200 Acute and subacute hepatic failure without coma: Secondary | ICD-10-CM

## 2022-04-06 DIAGNOSIS — B179 Acute viral hepatitis, unspecified: Secondary | ICD-10-CM | POA: Diagnosis not present

## 2022-04-06 DIAGNOSIS — N179 Acute kidney failure, unspecified: Secondary | ICD-10-CM | POA: Diagnosis not present

## 2022-04-06 DIAGNOSIS — I2694 Multiple subsegmental pulmonary emboli without acute cor pulmonale: Secondary | ICD-10-CM

## 2022-04-06 DIAGNOSIS — F1010 Alcohol abuse, uncomplicated: Secondary | ICD-10-CM | POA: Diagnosis not present

## 2022-04-06 LAB — CBC WITH DIFFERENTIAL/PLATELET
Abs Immature Granulocytes: 0.06 10*3/uL (ref 0.00–0.07)
Basophils Absolute: 0 10*3/uL (ref 0.0–0.1)
Basophils Relative: 0 %
Eosinophils Absolute: 0.1 10*3/uL (ref 0.0–0.5)
Eosinophils Relative: 3 %
HCT: 39.2 % (ref 39.0–52.0)
Hemoglobin: 13.4 g/dL (ref 13.0–17.0)
Immature Granulocytes: 1 %
Lymphocytes Relative: 4 %
Lymphs Abs: 0.3 10*3/uL — ABNORMAL LOW (ref 0.7–4.0)
MCH: 33.3 pg (ref 26.0–34.0)
MCHC: 34.2 g/dL (ref 30.0–36.0)
MCV: 97.5 fL (ref 80.0–100.0)
Monocytes Absolute: 0.2 10*3/uL (ref 0.1–1.0)
Monocytes Relative: 4 %
Neutro Abs: 5 10*3/uL (ref 1.7–7.7)
Neutrophils Relative %: 88 %
Platelets: 69 10*3/uL — ABNORMAL LOW (ref 150–400)
RBC: 4.02 MIL/uL — ABNORMAL LOW (ref 4.22–5.81)
RDW: 18.1 % — ABNORMAL HIGH (ref 11.5–15.5)
WBC: 5.6 10*3/uL (ref 4.0–10.5)
nRBC: 0 % (ref 0.0–0.2)

## 2022-04-06 LAB — COMPREHENSIVE METABOLIC PANEL
ALT: 4496 U/L — ABNORMAL HIGH (ref 0–44)
AST: 3535 U/L — ABNORMAL HIGH (ref 15–41)
Albumin: 3.1 g/dL — ABNORMAL LOW (ref 3.5–5.0)
Alkaline Phosphatase: 89 U/L (ref 38–126)
Anion gap: 7 (ref 5–15)
BUN: 31 mg/dL — ABNORMAL HIGH (ref 8–23)
CO2: 22 mmol/L (ref 22–32)
Calcium: 8.3 mg/dL — ABNORMAL LOW (ref 8.9–10.3)
Chloride: 104 mmol/L (ref 98–111)
Creatinine, Ser: 1.21 mg/dL (ref 0.61–1.24)
GFR, Estimated: 60 mL/min — ABNORMAL LOW (ref >60)
Glucose, Bld: 112 mg/dL — ABNORMAL HIGH (ref 70–99)
Potassium: 4 mmol/L (ref 3.5–5.1)
Sodium: 133 mmol/L — ABNORMAL LOW (ref 135–145)
Total Bilirubin: 1.5 mg/dL — ABNORMAL HIGH (ref 0.3–1.2)
Total Protein: 5.8 g/dL — ABNORMAL LOW (ref 6.5–8.1)

## 2022-04-06 LAB — PHOSPHORUS: Phosphorus: 2.4 mg/dL — ABNORMAL LOW (ref 2.5–4.6)

## 2022-04-06 LAB — MAGNESIUM: Magnesium: 1.8 mg/dL (ref 1.7–2.4)

## 2022-04-06 LAB — PROTIME-INR
INR: 4.1 (ref 0.8–1.2)
Prothrombin Time: 39.8 seconds — ABNORMAL HIGH (ref 11.4–15.2)

## 2022-04-06 MED ORDER — ONE-A-DAY MENS 50+ PO TABS
1.0000 | ORAL_TABLET | Freq: Every day | ORAL | Status: DC
Start: 2022-04-07 — End: 2022-04-06

## 2022-04-06 MED ORDER — TAMSULOSIN HCL 0.4 MG PO CAPS
0.4000 mg | ORAL_CAPSULE | Freq: Every day | ORAL | Status: DC
  Administered 2022-04-07 – 2022-04-09 (×3): 0.4 mg via ORAL
  Filled 2022-04-06 (×3): qty 1

## 2022-04-06 MED ORDER — FOLIC ACID 1 MG PO TABS
1.0000 mg | ORAL_TABLET | Freq: Every day | ORAL | Status: DC
  Administered 2022-04-06 – 2022-04-09 (×4): 1 mg via ORAL
  Filled 2022-04-06 (×4): qty 1

## 2022-04-06 MED ORDER — THIAMINE HCL 100 MG PO TABS
100.0000 mg | ORAL_TABLET | Freq: Every day | ORAL | Status: DC
  Administered 2022-04-08 – 2022-04-09 (×2): 100 mg via ORAL
  Filled 2022-04-06 (×2): qty 1

## 2022-04-06 MED ORDER — THIAMINE HCL 100 MG/ML IJ SOLN
100.0000 mg | Freq: Every day | INTRAMUSCULAR | Status: DC
  Administered 2022-04-07: 100 mg via INTRAVENOUS
  Filled 2022-04-06: qty 2

## 2022-04-06 MED ORDER — ADULT MULTIVITAMIN W/MINERALS CH
1.0000 | ORAL_TABLET | Freq: Every day | ORAL | Status: DC
  Administered 2022-04-06 – 2022-04-09 (×4): 1 via ORAL
  Filled 2022-04-06 (×4): qty 1

## 2022-04-06 MED ORDER — SODIUM CHLORIDE 0.9 % IV SOLN
500.0000 mg | Freq: Once | INTRAVENOUS | Status: AC
Start: 2022-04-06 — End: 2022-04-06
  Administered 2022-04-06: 500 mg via INTRAVENOUS
  Filled 2022-04-06: qty 5

## 2022-04-06 MED ORDER — K PHOS MONO-SOD PHOS DI & MONO 155-852-130 MG PO TABS
500.0000 mg | ORAL_TABLET | Freq: Three times a day (TID) | ORAL | Status: AC
Start: 2022-04-06 — End: 2022-04-08
  Administered 2022-04-06 – 2022-04-08 (×6): 500 mg via ORAL
  Filled 2022-04-06 (×6): qty 2

## 2022-04-06 MED ORDER — LORAZEPAM 2 MG/ML IJ SOLN: 1.0000 mg | INTRAMUSCULAR | Status: AC | PRN

## 2022-04-06 MED ORDER — CHOLECALCIFEROL 10 MCG (400 UNIT) PO TABS
800.0000 [IU] | ORAL_TABLET | Freq: Every morning | ORAL | Status: DC
  Administered 2022-04-07 – 2022-04-09 (×3): 800 [IU] via ORAL
  Filled 2022-04-06 (×3): qty 2

## 2022-04-06 MED ORDER — TRAZODONE HCL 50 MG PO TABS
100.0000 mg | ORAL_TABLET | Freq: Every evening | ORAL | Status: DC | PRN
Start: 2022-04-06 — End: 2022-04-09
  Administered 2022-04-06 – 2022-04-09 (×3): 100 mg via ORAL
  Filled 2022-04-06 (×3): qty 2

## 2022-04-06 MED ORDER — MOMETASONE FURO-FORMOTEROL FUM 200-5 MCG/ACT IN AERO
2.0000 | INHALATION_SPRAY | Freq: Two times a day (BID) | RESPIRATORY_TRACT | Status: DC
  Administered 2022-04-06 – 2022-04-09 (×6): 2 via RESPIRATORY_TRACT
  Filled 2022-04-06: qty 8.8

## 2022-04-06 MED ORDER — LORAZEPAM 1 MG PO TABS: 1.0000 mg | ORAL_TABLET | ORAL | Status: AC | PRN

## 2022-04-06 NOTE — Consult Note (Signed)
[facility] Gastroenterology Consult  Referring Provider: No ref. provider found Primary Care Physician:  Lujean Amel, MD Primary Gastroenterologist: Dr. Paulita Fujita  Reason for Consultation: Abnormal LFTs  HPI: Jason [name] is a 81 y.o. male had outpatient labs with his PCP on 04/03/2022 which showed T. bili 2.3, AST 3196, ALT 1953, ALP 61 with BUN 35, creatinine 1.36 and GFR 52, hemoglobin 13.4, MCV 98.4,platelet 44 and was advised to proceed to ER.  Patient is hard of hearing, most of the history is obtained from the patient as well as his son present at bedside. Patient has history of significant alcohol use and his son found to large empty bottles of whiskey and vodka in the sink. It seems patient has been drinking for the past 2 months, amount unknown and has not been feeling well for the past several weeks.  When he came to the ER, T. bili was 1.9, AST 7671, ALT 6582 and ALP 86 with an elevated PT of 53.8 and INR of 6.1. There has been no history of blood in stool, black stools, confusion, nausea, vomiting, abdominal pain, acid reflux, heartburn, unintentional weight loss, early satiety, pain or trouble with swallowing.  Prior GI work-up: Colonoscopy 12/2013/: few tubular adenomas removed Virtual colonoscopy 02/07/2021: Diffuse colonic diverticulosis, no polyps noted EGD 12/02/2020: Reflux esophagitis, acute gastritis without bleeding, medium size hiatal hernia  Past Medical History:  Diagnosis Date   Anticoagulated    BPH (benign prostatic hyperplasia)    Chronic respiratory failure (HCC)    Colon polyps    COPD (chronic obstructive pulmonary disease) (HCC)    Ectopic beats    GERD (gastroesophageal reflux disease)    Hypercholesteremia    Hypercoagulable state ([place])    Hyperlipidemia    Hypertension    Insomnia    Irregular heart beat    Lesion of vertebra    Liver lesion    Pulmonary embolism ([place])    Pulmonary nodule     Past Surgical History:  Procedure Laterality Date    ESOPHAGOGASTRODUODENOSCOPY (EGD) WITH PROPOFOL N/A 12/02/2020   Procedure: ESOPHAGOGASTRODUODENOSCOPY (EGD) WITH PROPOFOL;  Surgeon: Wilford Corner, MD;  Location: [facility];  Service: Endoscopy;  Laterality: N/A;   NO PAST SURGERIES      Prior to Admission medications   Medication Sig Start Date End Date Taking? Authorizing Provider  acetaminophen (TYLENOL) 650 MG CR tablet Take 650 mg by mouth 3 (three) times daily.   Yes [provider]  Cholecalciferol (VITAMIN D3) 10 MCG (400 UNIT) CAPS Take 800 Units by mouth in the morning.   Yes [provider]  Fluticasone-Salmeterol (ADVAIR) 250-50 MCG/DOSE AEPB Inhale 1 puff into the lungs 2 (two) times daily.   Yes [provider]  lisinopril-hydrochlorothiazide (ZESTORETIC) 20-25 MG tablet Take 1 tablet by mouth daily. 04/08/19  Yes [provider]  LORazepam (ATIVAN) 0.5 MG tablet Take 0.5 mg by mouth See admin instructions. Take 0.5 mg by mouth two times a day for 4 days, 0.5 mg once a day for 4 days, then stop 04/03/22 04/12/22 Yes [provider]  Multiple Vitamins-Minerals (ONE-A-DAY MENS 50+) TABS Take 1 tablet by mouth daily with breakfast.   Yes [provider]  pantoprazole (PROTONIX) 40 MG tablet Take 1 tablet (40 mg total) by mouth 2 (two) times daily. After 2 months go back to once a day regimen Patient taking differently: Take 40 mg by mouth daily before breakfast. After 2 months go back to once a day regimen 12/03/20 04/05/22 Yes Kc,  Maren Beach, MD  POTASSIUM PO Take 1 tablet by mouth in the morning.   Yes [provider]  pravastatin (PRAVACHOL) 80 MG tablet Take 80 mg by mouth at bedtime. 04/07/19  Yes [provider]  tamsulosin (FLOMAX) 0.4 MG CAPS capsule Take 0.4 mg by mouth daily after breakfast.   Yes [provider]  traZODone (DESYREL) 100 MG tablet Take 100 mg by mouth at bedtime. 03/24/19  Yes [provider]  XARELTO 20 MG TABS tablet Take 20 mg  by mouth daily.   Yes [provider]  potassium chloride SA (K-DUR) 20 MEQ tablet Take 2 tablets (40 mEq total) by mouth daily. Patient not taking: Reported on 04/05/2022 04/14/19   Kayleen Memos, DO    Current Facility-Administered Medications  Medication Dose Route Frequency Provider Last Rate Last Admin   lactated ringers infusion   Intravenous Continuous Kayleen Memos, DO 50 mL/hr at 04/05/22 2133 New Bag at 04/05/22 2133   lactulose (CHRONULAC) 10 GM/15ML solution 20 g  20 g Oral BID PRN Irene Pap N, DO       oxyCODONE (Oxy IR/ROXICODONE) immediate release tablet 5 mg  5 mg Oral Q6H PRN Irene Pap N, DO       phytonadione (VITAMIN K) tablet 10 mg  10 mg Oral Daily Irene Pap N, DO   10 mg at 04/05/22 2133   prochlorperazine (COMPAZINE) injection 5 mg  5 mg Intravenous Q6H PRN Irene Pap N, DO        Allergies as of 04/05/2022   (No Known Allergies)    Family History  Problem Relation Age of Onset   CVA Mother    Deep vein thrombosis Mother    CVA Father     Social History   Socioeconomic History   Marital status: Single    Spouse name: Not on file   Number of children: Not on file   Years of education: Not on file   Highest education level: Not on file  Occupational History   Not on file  Tobacco Use   Smoking status: Former    Packs/day: 2.00    Years: 45.00    Pack years: 90.00    Types: Cigarettes    Quit date: 06/29/2010    Years since quitting: 11.7   Smokeless tobacco: Never  Substance and Sexual Activity   Alcohol use: Yes   Drug use: Never   Sexual activity: Not on file  Other Topics Concern   Not on file  Social History Narrative   Not on file   Social Determinants of Health   Financial Resource Strain: Not on file  Food Insecurity: Not on file  Transportation Needs: Not on file  Physical Activity: Not on file  Stress: Not on file  Social Connections: Not on file  Intimate Partner Violence: Not on file    Review of  Systems: GI: Described in detail in HPI.    Gen: Denies any fever, chills, rigors, night sweats, anorexia, fatigue, weakness, malaise, involuntary weight loss, and sleep disorder CV: Denies chest pain, angina, palpitations, syncope, orthopnea, PND, peripheral edema, and claudication. Resp: Denies dyspnea, cough, sputum, wheezing, coughing up blood. GU : Denies urinary burning, blood in urine, urinary frequency, urinary hesitancy, nocturnal urination, and urinary incontinence. MS: Denies joint pain or swelling.  Denies muscle weakness, cramps, atrophy.  Derm: Denies rash, itching, oral ulcerations, hives, unhealing ulcers.  Psych: Denies depression, anxiety, memory loss, suicidal ideation, hallucinations,  and confusion. Heme: Denies bruising,  bleeding, and enlarged lymph nodes. Neuro:  Denies any headaches, dizziness, paresthesias. Endo:  Denies any problems with DM, thyroid, adrenal function.  Physical Exam: Vital signs in last 24 hours: Temp:  [97.5 F (36.4 C)-98.3 F (36.8 C)] 98.3 F (36.8 C) (06/03 0313) Pulse Rate:  [62-116] 82 (06/03 0313) Resp:  [18-29] 20 (06/03 0313) BP: (94-133)/(61-80) 133/64 (06/03 0313) SpO2:  [91 %-97 %] 95 % (06/03 0313) Weight:  [75.3 kg-76 kg] 76 kg (06/03 0500) Last BM Date : 04/04/22  General:   Alert,  Well-developed, well-nourished, pleasant and cooperative in NAD Head:  Normocephalic and atraumatic. Eyes:  Sclera clear, no icterus.   Conjunctiva pink. Ears: Hard of hearing Nose:  No deformity, discharge,  or lesions. Mouth:  No deformity or lesions.  Oropharynx pink & moist. Neck:  Supple; no masses or thyromegaly. Lungs:  Clear throughout to auscultation.   No wheezes, crackles, or rhonchi. No acute distress. Heart:  Regular rate and rhythm; no murmurs, clicks, rubs,  or gallops. Extremities:  Without clubbing or edema. Neurologic:  Alert and  oriented x4;  grossly normal neurologically. No asterixis Skin:  Intact without significant  lesions or rashes. Psych:  Alert and cooperative. Normal mood and affect. Abdomen:  Soft, nontender and nondistended. No masses, hepatosplenomegaly or hernias noted. Normal bowel sounds, without guarding, and without rebound.         Lab Results: Recent Labs    04/05/22 1523 04/06/22 0122  WBC 5.7 5.6  HGB 14.2 13.4  HCT 43.9 39.2  PLT PLATELET CLUMPS NOTED ON SMEAR, UNABLE TO ESTIMATE 69*   BMET Recent Labs    04/05/22 1523 04/05/22 1943 04/06/22 0122  NA 134* 134* 133*  K 4.2 4.0 4.0  CL 101 102 104  CO2 20* 21* 22  GLUCOSE 122* 114* 112*  BUN 37* 35* 31*  CREATININE 1.74* 1.42* 1.21  CALCIUM 8.8* 8.3* 8.3*   LFT Recent Labs    04/06/22 0122  PROT 5.8*  ALBUMIN 3.1*  AST 3,535*  ALT 4,496*  ALKPHOS 89  BILITOT 1.5*   PT/INR Recent Labs    04/05/22 1943 04/06/22 0122  LABPROT 47.2* 39.8*  INR 5.2* 4.1*    Studies/Results: CT Abdomen Pelvis W Contrast  Result Date: 04/05/2022 CLINICAL DATA:  Acute abdominal pain. EXAM: CT ABDOMEN AND PELVIS WITH CONTRAST TECHNIQUE: Multidetector CT imaging of the abdomen and pelvis was performed using the standard protocol following bolus administration of intravenous contrast. RADIATION DOSE REDUCTION: This exam was performed according to the departmental dose-optimization program which includes automated exposure control, adjustment of the mA and/or kV according to patient size and/or use of iterative reconstruction technique. CONTRAST:  142m OMNIPAQUE IOHEXOL 300 MG/ML  SOLN COMPARISON:  CT virtual colonoscopy 02/07/2021. FINDINGS: Lower chest: There is stable atelectasis or scarring in the lung bases. Stable 3 mm nodule in the right lower lobe image 5/37. There is a calcified granuloma in the left lung base. Hepatobiliary: There is diffuse fatty infiltration of the liver. There is focal fat in the region of the gallbladder fossa, unchanged. Small gallstones are likely present. There is no biliary ductal dilatation. Pancreas:  Unremarkable. No pancreatic ductal dilatation or surrounding inflammatory changes. Spleen: There are calcified granulomas in the spleen. There is a new peripheral small wedge-shaped hypodensity in the spleen which may represent splenic infarct. This is new from prior. Adrenals/Urinary Tract: Rounded hypodensity in the right kidney is too small to characterize, likely a cyst. Otherwise, the kidneys, adrenal glands and bladder  are within normal limits. Stomach/Bowel: Stomach is within normal limits. Appendix appears normal. No evidence of bowel wall thickening, distention, or inflammatory changes. There is diffuse colonic diverticulosis without evidence for acute diverticulitis. Vascular/Lymphatic: Aortic atherosclerosis. No enlarged abdominal or pelvic lymph nodes. Reproductive: Prostate is unremarkable. Other: Small fat containing left inguinal and umbilical hernias. No ascites. Musculoskeletal: Numerous sclerotic osseous lesions are again seen most significant at T12, similar to prior. There are degenerative changes of the spine. Chronic compression deformity of L1 is unchanged. IMPRESSION: 1. No acute process in the abdomen or pelvis. 2. 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 3. Colonic diverticulosis. 4. Stable sclerotic lesions throughout the osseous structures. 5. Fatty infiltration of the liver. 6.  Aortic Atherosclerosis (ICD10-I70.0). Electronically Signed   By: Ronney Asters M.D.   On: 04/05/2022 17:25    Impression: Elevated AST, ALT with blood pressure of 94/73 on admission with a heart rate of 116-likely related to ischemic hepatitis AST 7671/4675/3535 ALT 6582/4779/4496 Serology for Hepatitis B/C/acute hepatitis A unremarkable  Coagulopathy without encephalopathy-patient not in acute liver failure INR 4.1 today No evidence of melena, hematochezia, minor nosebleeds reported by patient's son Ammonia 33, no asterixis  CAT scan with contrast shows diffuse fatty infiltration likely related to  history of significant alcohol use Thrombocytopenia, platelets 69, likely related to significant alcohol use  Renal impairment, BUN 31/creatinine 1.21/GFR 60 today-improving from admission  Plan: Downtrending LFTs(hepatocellular dysfunction, not cholestasis) likely related to ischemic hepatitis. Coagulopathy likely related to significant alcohol use and Xarelto use without associated bleeding No signs of liver failure, no hepatic encephalopathy, reassuring  Continue regular diet Has received vitamin K 10 mg IV x1 dose and will be getting vitamin K 10 mg p.o. daily for 3 days, Xarelto to be hold at least until PT/INR normalize Supportive management As LFTs continue to trend down, and INR improves, possible DC in the next few days. Recommend monitoring for symptoms of alcohol withdrawal.   LOS: 1 day   Ronnette Juniper, MD  04/06/2022, 7:30 AM

## 2022-04-06 NOTE — Progress Notes (Signed)
PROGRESS NOTE    Jason [name]  NAT:[id number] DOB: July 05, 1941 DOA: 04/05/2022 PCP: Lujean Amel, MD    Chief Complaint  Patient presents with   Abnormal Lab    Brief Narrative:   Jason [name] is a 81 y.o. male with medical history significant for pulmonary embolism on Xarelto, essential hypertension, hyperlipidemia, GERD, alcoholism, who presented to [facility] ED at the recommendation of his PCP due to abnormal labs and generalized weakness.  Patient initially went to his PCP due to generalized weakness lasting for the past few weeks.  Labs were drawn and resulted with new severe thrombocytopenia with platelet count in the 40s, and severely elevated LFTs greater than 3000.  Patient was advised to go to the ED for further evaluation and management.  No hematemesis, hematochezia or melena.  Denies overt bleeding.  Last dose of Xarelto was taken this morning.   Upon presentation to the ED, the patient is hypotensive with SBP's in the 80s, improved after 500 cc normal saline IV fluid bolus.  Labs in the ED notable for severely elevated liver chemistries, AST and ALT greater than 6000, INR 6.1.  CT abdomen and pelvis with contrast was unrevealing.  No acute process in the abdomen or pelvis.  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  Colonic diverticulosis.  Fatty infiltration of the liver.  Aortic atherosclerosis.    EDP discussed the case with [facility], Dr. Therisa Doyne, who recommended admission for supportive care, vitamin K, and to trend INR.  The patient is alert and oriented x4.  No evidence of hepatic encephalopathy.  EDP also discussed the case with hepatology at [facility] who recommended supportive care for now since the patient has no encephalopathy.  In the ED the patient received 1 dose of IV vitamin K 10 mg x 1 and NS 500 cc bolus.  EDP requested admission by hospitalist service.  Admitted by [facility].   ED Course: Tmax 97.5.  BP 116/65, pulse 64, respiratory rate 24, saturation 96% on room air.  Lab studies  significant for serum sodium 134, serum bicarb 20, glucose 122, BUN 37, creatinine 1.74, GFR 39, with baseline creatinine of 1.05 and GFR greater than 60.  AST 7671, ALT 6582, ammonia 33, T. bili 1.9.  Platelet count, platelet clumps noted on smear unable to estimate, recommended repeat to verify.   Assessment & Plan:   Principal Problem:   Acute hepatitis Active Problems:   Pulmonary emboli (HCC)   COPD (chronic obstructive pulmonary disease) (HCC)   Alcohol abuse  Transaminitis - due Alcohol abuse and ischemic hepatitis. -If hypotension initially, avoid low blood pressure, so far acceptable on IV fluids, if needed will add midodrine, will give albumin -Continue to trend CMP daily. -Avoid hepatotoxic medications. Ammonia WNL 33-no encephalopathy Acute hepatitis virus panel negative. CT abdomen pelvis with contrast was unrevealing for any acute intra-abdominal pelvic findings.  Coagulopathy -Due to Xarelto and alcohol use, no evidence of bleeding. -Continue with vitamin K -Continue to hold Xarelto until PT/INR normalizes per GI recommendation.  AKI, likely prerenal Baseline creatinine appears to be 1.0 with GFR greater than 60 Presented with creatinine 1.74 with GFR of 39 Improving with IV fluids, avoid hypotension   History of pulmonary embolism on Xarelto Hold off anticoagulation, home Xarelto, in the setting of coagulopathy   Hypovolemic hyponatremia Return hydration   Hypertension, currently hypotensive on presentation Hold off home oral antihypertensives   GERD Continue with PPI   Hyperlipidemia Hold off home statin due to severely elevated liver chemistries  Alcohol use disorder Reports last alcohol intake, liquor was 5 days ago. Continue with CIWA protocol  Hypophosphatemia -Repleted, continue to monitor    DVT prophylaxis: scd, xarelto on hold  Code Status: Full Family Communication: Son at bedside Disposition:   Status is: Inpatient     Consultants:  GI   Subjective:  No significant events overnight, he denies any complaints  Objective: Vitals:   04/06/22 0313 04/06/22 0500 04/06/22 0731 04/06/22 0800  BP: 133/64   129/69  Pulse: 82   (!) 101  Resp: 20   (!) 22  Temp: 98.3 F (36.8 C)  99 F (37.2 C)   TempSrc: Oral  Oral   SpO2: 95%   94%  Weight:  76 kg    Height:        Intake/Output Summary (Last 24 hours) at 04/06/2022 1501 Last data filed at 04/06/2022 1300 Gross per 24 hour  Intake 521.64 ml  Output 950 ml  Net -428.36 ml   Filed Weights   04/05/22 1515 04/06/22 0500  Weight: 75.3 kg 76 kg    Examination:  General exam: Appears calm and comfortable  Respiratory system: Clear to auscultation. Respiratory effort normal. Cardiovascular system: S1 & S2 heard, RRR. No JVD, murmurs, rubs, gallops or clicks. No pedal edema. Gastrointestinal system: Abdomen is nondistended, soft and nontender. No organomegaly or masses felt. Normal bowel sounds heard. Central nervous system: Alert and oriented. No focal neurological deficits. Extremities: Symmetric 5 x 5 power. Skin: No rashes, lesions or ulcers Psychiatry: Judgement and insight appear normal. Mood & affect appropriate.     Data Reviewed: I have personally reviewed following labs and imaging studies  CBC: Recent Labs  Lab 04/05/22 1523 04/06/22 0122  WBC 5.7 5.6  NEUTROABS 4.7 5.0  HGB 14.2 13.4  HCT 43.9 39.2  MCV 101.4* 97.5  PLT PLATELET CLUMPS NOTED ON SMEAR, UNABLE TO ESTIMATE 69*    Basic Metabolic Panel: Recent Labs  Lab 04/05/22 1523 04/05/22 1943 04/06/22 0122  NA 134* 134* 133*  K 4.2 4.0 4.0  CL 101 102 104  CO2 20* 21* 22  GLUCOSE 122* 114* 112*  BUN 37* 35* 31*  CREATININE 1.74* 1.42* 1.21  CALCIUM 8.8* 8.3* 8.3*  MG  --   --  1.8  PHOS  --   --  2.4*    GFR: Estimated Creatinine Clearance: 48.6 mL/min (by C-G formula based on SCr of 1.21 mg/dL).  Liver Function Tests: Recent Labs  Lab 04/05/22 1523  04/05/22 1943 04/06/22 0122  AST 7,671* 4,675* 3,535*  ALT 6,582* 4,779* 4,496*  ALKPHOS 86 78 89  BILITOT 1.9* 1.6* 1.5*  PROT 6.6 5.6* 5.8*  ALBUMIN 3.5 3.0* 3.1*    CBG: No results for input(s): GLUCAP in the last 168 hours.   No results found for this or any previous visit (from the past 240 hour(s)).       Radiology Studies: CT Abdomen Pelvis W Contrast  Result Date: 04/05/2022 CLINICAL DATA:  Acute abdominal pain. EXAM: CT ABDOMEN AND PELVIS WITH CONTRAST TECHNIQUE: Multidetector CT imaging of the abdomen and pelvis was performed using the standard protocol following bolus administration of intravenous contrast. RADIATION DOSE REDUCTION: This exam was performed according to the departmental dose-optimization program which includes automated exposure control, adjustment of the mA and/or kV according to patient size and/or use of iterative reconstruction technique. CONTRAST:  151m OMNIPAQUE IOHEXOL 300 MG/ML  SOLN COMPARISON:  CT virtual colonoscopy 02/07/2021. FINDINGS: Lower chest: There is stable atelectasis  or scarring in the lung bases. Stable 3 mm nodule in the right lower lobe image 5/37. There is a calcified granuloma in the left lung base. Hepatobiliary: There is diffuse fatty infiltration of the liver. There is focal fat in the region of the gallbladder fossa, unchanged. Small gallstones are likely present. There is no biliary ductal dilatation. Pancreas: Unremarkable. No pancreatic ductal dilatation or surrounding inflammatory changes. Spleen: There are calcified granulomas in the spleen. There is a new peripheral small wedge-shaped hypodensity in the spleen which may represent splenic infarct. This is new from prior. Adrenals/Urinary Tract: Rounded hypodensity in the right kidney is too small to characterize, likely a cyst. Otherwise, the kidneys, adrenal glands and bladder are within normal limits. Stomach/Bowel: Stomach is within normal limits. Appendix appears normal. No  evidence of bowel wall thickening, distention, or inflammatory changes. There is diffuse colonic diverticulosis without evidence for acute diverticulitis. Vascular/Lymphatic: Aortic atherosclerosis. No enlarged abdominal or pelvic lymph nodes. Reproductive: Prostate is unremarkable. Other: Small fat containing left inguinal and umbilical hernias. No ascites. Musculoskeletal: Numerous sclerotic osseous lesions are again seen most significant at T12, similar to prior. There are degenerative changes of the spine. Chronic compression deformity of L1 is unchanged. IMPRESSION: 1. No acute process in the abdomen or pelvis. 2. 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 3. Colonic diverticulosis. 4. Stable sclerotic lesions throughout the osseous structures. 5. Fatty infiltration of the liver. 6.  Aortic Atherosclerosis (ICD10-I70.0). Electronically Signed   By: Ronney Asters M.D.   On: 04/05/2022 17:25        Scheduled Meds:  folic acid  1 mg Oral Daily   multivitamin with minerals  1 tablet Oral Daily   phytonadione  10 mg Oral Daily   [START ON 04/07/2022] thiamine  100 mg Oral Daily   Or   [START ON 04/07/2022] thiamine  100 mg Intravenous Daily   Continuous Infusions:  lactated ringers 50 mL/hr at 04/06/22 0700     LOS: 1 day       Phillips Climes, MD [facility]   To contact the attending provider between 7A-7P or the covering provider during after hours 7P-7A, please log into the web site [email or web address] and access using universal [place] password for that web site. If you do not have the password, please call the hospital operator.  04/06/2022, 3:01 PM

## 2022-04-06 NOTE — Evaluation (Signed)
Physical Therapy Evaluation Patient Details Name: Jason [name] MRN: [id number] DOB: 1940-12-11 Today's Date: 04/06/2022  History of Present Illness  81 y/o male presented to ED on 04/05/22 for abnormal labs and generalized weakness. Admitted for severe thrombocytopenia and severely elevated LFTs. PMH: hx of PE on Xarelto, HTN, alcoholism  Clinical Impression  Patient admitted with the above. PTA, patient was living alone and independent up until a couple of weeks ago when son was having to assist due to weakness and intoxication. Patient currently presents with impaired balance, weakness, and decreased activity tolerance. Difficult to assess cognition as patient is severely [facility] but son reports cognition is at baseline. Patient required min-modA for ambulation in hallway with no AD. Patient had LOB x 5 during mobility but unaware. Patient will benefit from skilled PT services during acute stay to address listed deficits. Recommend SNF at this time to maximize functional independence and safety. If family able to provide supervision, patient could go home with OPPT.        Recommendations for follow up therapy are one component of a multi-disciplinary discharge planning process, led by the attending physician.  Recommendations may be updated based on patient status, additional functional criteria and insurance authorization.  Follow Up Recommendations Skilled nursing-short term rehab (<3 hours/day)    Assistance Recommended at Discharge Frequent or constant Supervision/Assistance  Patient can return home with the following  A little help with walking and/or transfers;A little help with bathing/dressing/bathroom;Assistance with cooking/housework;Direct supervision/assist for medications management;Direct supervision/assist for financial management;Assist for transportation    Equipment Recommendations None recommended by PT  Recommendations for Other Services       Functional Status Assessment  Patient has had a recent decline in their functional status and demonstrates the ability to make significant improvements in function in a reasonable and predictable amount of time.     Precautions / Restrictions Precautions Precautions: Fall Restrictions Weight Bearing Restrictions: No      Mobility  Bed Mobility Overal bed mobility: Needs Assistance Bed Mobility: Supine to Sit, Sit to Supine     Supine to sit: Min assist Sit to supine: Min assist   General bed mobility comments: minA for trunk elevation and bringing LEs back into bed    Transfers Overall transfer level: Needs assistance Equipment used: None Transfers: Sit to/from Stand Sit to Stand: Min assist           General transfer comment: minA to rise and steady. initially bracing LEs against bed    Ambulation/Gait Ambulation/Gait assistance: Min assist Gait Distance (Feet): 250 Feet Assistive device: None Gait Pattern/deviations: Step-through pattern, Decreased stride length, Narrow base of support Gait velocity: decreased     General Gait Details: MinA for balance throughout ambulation. LOB x 5 during mobility requiring up to modA to recover. Patient unaware. Mild L knee buckling noted during one LOB  Stairs            Wheelchair Mobility    Modified Rankin (Stroke Patients Only)       Balance Overall balance assessment: Needs assistance Sitting-balance support: No upper extremity supported, Feet supported Sitting balance-Leahy Scale: Good     Standing balance support: No upper extremity supported, During functional activity Standing balance-Leahy Scale: Poor Standing balance comment: minA for balance                             Pertinent Vitals/Pain Pain Assessment Pain Assessment: No/denies pain    Home Living  Family/patient expects to be discharged to:: Private residence Living Arrangements: Alone Available Help at Discharge: Family;Available  PRN/intermittently Type of Home: Apartment Home Access: Stairs to enter   Entrance Stairs-Number of Steps: curb   Home Layout: One level Home Equipment: None      Prior Function Prior Level of Function : Independent/Modified Independent             Mobility Comments: independent up until past 2 weeks where son has had to assist due to intoxication from alcohol ADLs Comments: sponges bathes with "dry shampoo" per son     Hand Dominance        Extremity/Trunk Assessment   Upper Extremity Assessment Upper Extremity Assessment: Generalized weakness    Lower Extremity Assessment Lower Extremity Assessment: Generalized weakness    Cervical / Trunk Assessment Cervical / Trunk Assessment: Normal  Communication   Communication: HOH (severely)  Cognition Arousal/Alertness: Awake/alert Behavior During Therapy: WFL for tasks assessed/performed Overall Cognitive Status: Difficult to assess                                 General Comments: son states his cognition is at baseline and that he's just hard of hearing        General Comments      Exercises     Assessment/Plan    PT Assessment Patient needs continued PT services  PT Problem List Decreased strength;Decreased balance;Decreased activity tolerance;Decreased mobility;Decreased coordination;Decreased safety awareness       PT Treatment Interventions Gait training;DME instruction;Functional mobility training;Therapeutic activities;Therapeutic exercise;Balance training;Patient/family education    PT Goals (Current goals can be found in the Care Plan section)  Acute Rehab PT Goals Patient Stated Goal: did not state. Son wants patient to be independent PT Goal Formulation: With patient/family Time For Goal Achievement: 04/20/22 Potential to Achieve Goals: Good    Frequency Min 3X/week     Co-evaluation               AM-PAC PT "6 Clicks" Mobility  Outcome Measure Help needed turning  from your back to your side while in a flat bed without using bedrails?: A Little Help needed moving from lying on your back to sitting on the side of a flat bed without using bedrails?: A Little Help needed moving to and from a bed to a chair (including a wheelchair)?: A Little Help needed standing up from a chair using your arms (e.g., wheelchair or bedside chair)?: A Little Help needed to walk in hospital room?: A Lot Help needed climbing 3-5 steps with a railing? : Total 6 Click Score: 15    End of Session Equipment Utilized During Treatment: Gait belt Activity Tolerance: Patient tolerated treatment well Patient left: in bed;with call bell/phone within reach;with family/visitor present Nurse Communication: Mobility status PT Visit Diagnosis: Muscle weakness (generalized) (M62.81);Unsteadiness on feet (R26.81);Difficulty in walking, not elsewhere classified (R26.2)    Time: 5784-6962 PT Time Calculation (min) (ACUTE ONLY): 26 min   Charges:   PT Evaluation $PT Eval Moderate Complexity: 1 Mod PT Treatments $Gait Training: 8-22 mins        Tiffiany Beadles A. Gilford Rile PT, DPT Acute Rehabilitation Services Pager [phone] Office [phone]   Linna Hoff 04/06/2022, 4:43 PM

## 2022-04-07 DIAGNOSIS — B179 Acute viral hepatitis, unspecified: Secondary | ICD-10-CM | POA: Diagnosis not present

## 2022-04-07 DIAGNOSIS — F1010 Alcohol abuse, uncomplicated: Secondary | ICD-10-CM | POA: Diagnosis not present

## 2022-04-07 DIAGNOSIS — N179 Acute kidney failure, unspecified: Secondary | ICD-10-CM | POA: Diagnosis not present

## 2022-04-07 DIAGNOSIS — K7200 Acute and subacute hepatic failure without coma: Secondary | ICD-10-CM | POA: Diagnosis not present

## 2022-04-07 LAB — COMPREHENSIVE METABOLIC PANEL
ALT: 2376 U/L — ABNORMAL HIGH (ref 0–44)
AST: 747 U/L — ABNORMAL HIGH (ref 15–41)
Albumin: 2.7 g/dL — ABNORMAL LOW (ref 3.5–5.0)
Alkaline Phosphatase: 119 U/L (ref 38–126)
Anion gap: 8 (ref 5–15)
BUN: 18 mg/dL (ref 8–23)
CO2: 24 mmol/L (ref 22–32)
Calcium: 8.5 mg/dL — ABNORMAL LOW (ref 8.9–10.3)
Chloride: 104 mmol/L (ref 98–111)
Creatinine, Ser: 0.88 mg/dL (ref 0.61–1.24)
GFR, Estimated: >60 mL/min (ref >60)
Glucose, Bld: 111 mg/dL — ABNORMAL HIGH (ref 70–99)
Potassium: 3.7 mmol/L (ref 3.5–5.1)
Sodium: 136 mmol/L (ref 135–145)
Total Bilirubin: 1.3 mg/dL — ABNORMAL HIGH (ref 0.3–1.2)
Total Protein: 5.5 g/dL — ABNORMAL LOW (ref 6.5–8.1)

## 2022-04-07 LAB — CBC
HCT: 35.6 % — ABNORMAL LOW (ref 39.0–52.0)
Hemoglobin: 12.5 g/dL — ABNORMAL LOW (ref 13.0–17.0)
MCH: 33.8 pg (ref 26.0–34.0)
MCHC: 35.1 g/dL (ref 30.0–36.0)
MCV: 96.2 fL (ref 80.0–100.0)
Platelets: 98 10*3/uL — ABNORMAL LOW (ref 150–400)
RBC: 3.7 MIL/uL — ABNORMAL LOW (ref 4.22–5.81)
RDW: 18.2 % — ABNORMAL HIGH (ref 11.5–15.5)
WBC: 3.6 10*3/uL — ABNORMAL LOW (ref 4.0–10.5)
nRBC: 0 % (ref 0.0–0.2)

## 2022-04-07 LAB — PROTIME-INR
INR: 2 — ABNORMAL HIGH (ref 0.8–1.2)
Prothrombin Time: 22.1 seconds — ABNORMAL HIGH (ref 11.4–15.2)

## 2022-04-07 LAB — MAGNESIUM: Magnesium: 1.7 mg/dL (ref 1.7–2.4)

## 2022-04-07 LAB — PHOSPHORUS: Phosphorus: 3 mg/dL (ref 2.5–4.6)

## 2022-04-07 MED ORDER — MAGNESIUM SULFATE 2 GM/50ML IV SOLN
2.0000 g | Freq: Once | INTRAVENOUS | Status: AC
  Administered 2022-04-07: 2 g via INTRAVENOUS
  Filled 2022-04-07: qty 50

## 2022-04-07 MED ORDER — ENSURE ENLIVE PO LIQD
237.0000 mL | Freq: Two times a day (BID) | ORAL | Status: DC
  Administered 2022-04-07 – 2022-04-09 (×4): 237 mL via ORAL

## 2022-04-07 MED ORDER — ENOXAPARIN SODIUM 40 MG/0.4ML IJ SOSY
40.0000 mg | PREFILLED_SYRINGE | INTRAMUSCULAR | Status: DC
  Administered 2022-04-07 – 2022-04-08 (×2): 40 mg via SUBCUTANEOUS
  Filled 2022-04-07 (×2): qty 0.4

## 2022-04-07 NOTE — TOC Initial Note (Signed)
Transition of Care [facility]) - Initial/Assessment Note    Patient Details  Name: Jason [name] MRN: [id number] Date of Birth: 04-10-1941  [facility] of [facility]) CM/SW Contact:    Ina Homes, [place] Phone Number: 04/07/2022, 12:13 PM  Clinical Narrative:                  SW met with pt, pt's ex wife, son Leroy Sea and daughter Mateo Flow at bedside. Leroy Sea and Mateo Flow had multiple questions regarding additional care and support for pt. Mateo Flow currently working with [facility] [employer] for ALF resources. SW explained options such as MCD (pending pt's income) to assist with LTC options, and ways to apply. SW addressed concerns about POA process. SW discussed PT recs for SNF, and possibility of insurance denial due to pt ambulating 250 ft with min A, but can still search for SNF and see. SW provided list of SNF's in pt area, Brad preferred [facility] and [facility]. SW explained if insurance denies SNF, can work to get [facility] set up. SW explained HH is only 2-4 x/wk for about 57mns to 1 hour pending plan of care. Personal Care services for more hours is private pay or set up through MCD. SW provided additional resources for ETOH use, pt's children report he usually just stops "cold [place]" he will likely not use the resources.   Expected Discharge Plan: Skilled Nursing Facility Barriers to Discharge: SNF Pending bed offer, Continued Medical Work up, IShip broker  Patient Goals and CMS Choice Patient states their goals for this hospitalization and ongoing recovery are:: return home CMS Medicare.gov Compare Post Acute Care list provided to:: Patient Represenative (must comment) (daughter and son) Choice offered to / list presented to : Adult Children  Expected Discharge Plan and Services Expected Discharge Plan: [place]ute Care Choice: [place]Living arrangements for the past 2 months: Single Family Home                                       Prior Living Arrangements/Services Living arrangements for the past 2 months: Single Family Home Lives with:: Self, Pets Patient language and need for interpreter reviewed:: Yes Do you feel safe going back to the place where you live?: Yes      Need for Family Participation in Patient Care: Yes (Comment)     Criminal Activity/Legal Involvement Pertinent to Current Situation/Hospitalization: No - Comment as needed  Activities of Daily Living Home Assistive Devices/Equipment: Hearing aid ADL Screening (condition at time of admission) Patient's cognitive ability adequate to safely complete daily activities?: Yes Is the patient deaf or have difficulty hearing?: Yes Does the patient have difficulty seeing, even when wearing glasses/contacts?: No Does the patient have difficulty concentrating, remembering, or making decisions?: No Patient able to express need for assistance with ADLs?: Yes Does the patient have difficulty dressing or bathing?: No Independently performs ADLs?: Yes (appropriate for developmental age) Does the patient have difficulty walking or climbing stairs?: No Weakness of Legs: None Weakness of Arms/Hands: None  Permission Sought/Granted Permission sought to share information with : Family Supports, FChartered certified accountantgranted to share information with : Yes, Verbal Permission Granted  Share Information with NAME: BLeory Plowman Permission granted to share info w AGENCY: SNF  Permission granted to share info w Relationship: son  Permission granted to share info w Contact Information:  [phone]  Emotional Assessment Appearance:: Appears stated age Attitude/Demeanor/Rapport: Unable to Assess Affect (typically observed): Appropriate Orientation: : Oriented to Self, Oriented to Place Alcohol / Substance Use: Alcohol Use Psych Involvement: No (comment)  Admission diagnosis:  Acute hepatitis [B17.9] Ischemic hepatitis [K72.00] AKI (acute kidney  injury) ([place]) [N17.9] Patient Active Problem List   Diagnosis Date Noted   Acute hepatitis 04/05/2022   GI bleed 11/30/2020   History of COVID-19 06/17/2019   CAD (coronary artery disease) 06/17/2019   Hyperlipidemia 06/17/2019   Bilateral pneumonia 06/16/2019   History of pulmonary embolus (PE) 04/13/2019   Acute and chronic respiratory failure with hypoxia (HCC)    Pulmonary emboli ([place]) 04/12/2019   COPD (chronic obstructive pulmonary disease) ([place]) 04/12/2019   Elevated troponin 04/12/2019   Pulmonary nodule 04/12/2019   Former smoker 04/12/2019   Alcohol abuse 04/12/2019   PCP:  Lujean Amel, MD Pharmacy:   [place], [place] - [address] [place] [address] Phone: [phone] Fax: [phone]     Social Determinants of Health (SDOH) Interventions    Readmission Risk Interventions     View : No data to display.

## 2022-04-07 NOTE — Progress Notes (Signed)
Subjective: Patient reports doing well, no signs of disorientation or encephalopathy.  Objective: Vital signs in last 24 hours: Temp:  [97.5 F (36.4 C)-98.9 F (37.2 C)] 98.6 F (37 C) (06/04 1144) Pulse Rate:  [54-103] 86 (06/04 1144) Resp:  [19-22] 19 (06/04 1144) BP: (100-126)/(41-86) 113/67 (06/04 1144) SpO2:  [90 %-97 %] 92 % (06/04 1144) Weight change:  Last BM Date : 04/04/22  PE: Hard of hearing but oriented GENERAL: No icterus, no pallor  ABDOMEN: Soft, nondistended, nontender, normoactive EXTREMITIES: No deformity  Lab Results: Results for orders placed or performed during the hospital encounter of 04/05/22 (from the past 48 hour(s))  Comprehensive metabolic panel     Status: Abnormal   Collection Time: 04/05/22  3:23 PM  Result Value Ref Range   Sodium 134 (L) 135 - 145 mmol/L   Potassium 4.2 3.5 - 5.1 mmol/L   Chloride 101 98 - 111 mmol/L   CO2 20 (L) 22 - 32 mmol/L   Glucose, Bld 122 (H) 70 - 99 mg/dL    Comment: Glucose reference range applies only to samples taken after fasting for at least 8 hours.   BUN 37 (H) 8 - 23 mg/dL   Creatinine, Ser 1.74 (H) 0.61 - 1.24 mg/dL   Calcium 8.8 (L) 8.9 - 10.3 mg/dL   Total Protein 6.6 6.5 - 8.1 g/dL   Albumin 3.5 3.5 - 5.0 g/dL   AST 7,671 (H) 15 - 41 U/L    Comment: RESULTS CONFIRMED BY MANUAL DILUTION   ALT 6,582 (H) 0 - 44 U/L    Comment: RESULTS CONFIRMED BY MANUAL DILUTION   Alkaline Phosphatase 86 38 - 126 U/L   Total Bilirubin 1.9 (H) 0.3 - 1.2 mg/dL   GFR, Estimated 39 (L) >60 mL/min    Comment: (NOTE) Calculated using the CKD-EPI Creatinine Equation (2021)    Anion gap 13 5 - 15    Comment: Performed at [facility] [address], [place], [place] [address]  CBC with Differential     Status: Abnormal   Collection Time: 04/05/22  3:23 PM  Result Value Ref Range   WBC 5.7 4.0 - 10.5 K/uL   RBC 4.33 4.22 - 5.81 MIL/uL   Hemoglobin 14.2 13.0 - 17.0 g/dL   HCT 43.9 39.0 - 52.0 %   MCV 101.4 (H)  80.0 - 100.0 fL   MCH 32.8 26.0 - 34.0 pg   MCHC 32.3 30.0 - 36.0 g/dL   RDW 18.1 (H) 11.5 - 15.5 %   Platelets PLATELET CLUMPS NOTED ON SMEAR, UNABLE TO ESTIMATE 150 - 400 K/uL    Comment: Immature Platelet Fraction may be clinically indicated, consider ordering this additional test [id number] REPEATED TO VERIFY PLATELET CLUMPS NOTED ON SMEAR, UNABLE TO ESTIMATE    nRBC 0.0 0.0 - 0.2 %   Neutrophils Relative % 83 %   Neutro Abs 4.7 1.7 - 7.7 K/uL   Lymphocytes Relative 8 %   Lymphs Abs 0.5 (L) 0.7 - 4.0 K/uL   Monocytes Relative 4 %   Monocytes Absolute 0.2 0.1 - 1.0 K/uL   Eosinophils Relative 3 %   Eosinophils Absolute 0.2 0.0 - 0.5 K/uL   Basophils Relative 1 %   Basophils Absolute 0.0 0.0 - 0.1 K/uL   Immature Granulocytes 1 %   Abs Immature Granulocytes 0.05 0.00 - 0.07 K/uL    Comment: Performed at [facility], [place] [address] [place], [place] [address]  Protime-INR  Status: Abnormal   Collection Time: 04/05/22  3:23 PM  Result Value Ref Range   Prothrombin Time 53.8 (H) 11.4 - 15.2 seconds   INR 6.1 (HH) 0.8 - 1.2    Comment: CRITICAL RESULT CALLED TO, READ BACK BY AND VERIFIED WITH: SPECIMEN CHECKED FOR CLOTS REPEATED TO Cleda Mccreedy RN [id number] 04/05/22 LIN AUNG (NOTE) INR goal varies based on device and disease states. Performed at [facility], [place] [address], [place], [place] [address]   Hepatitis panel, acute     Status: None   Collection Time: 04/05/22  3:23 PM  Result Value Ref Range   Hepatitis B Surface Ag NON REACTIVE NON REACTIVE   HCV Ab NON REACTIVE NON REACTIVE    Comment: (NOTE) Nonreactive HCV antibody screen is consistent with no HCV infections,  unless recent infection is suspected or other evidence exists to indicate HCV infection.     Hep A IgM NON REACTIVE NON REACTIVE   Hep B C IgM NON REACTIVE NON REACTIVE    Comment: Performed at [facility], [place] [address] [place], [place] [address]  Ethanol     Status: None    Collection Time: 04/05/22  3:23 PM  Result Value Ref Range   Alcohol, Ethyl (B) <10 <10 mg/dL    Comment: (NOTE) Lowest detectable limit for serum alcohol is 10 mg/dL.  For medical purposes only. Performed at [facility], [place] [address], [place], [place] [address]   Type and screen     Status: None   Collection Time: 04/05/22  3:23 PM  Result Value Ref Range   ABO/RH(D) A POS    Antibody Screen NEG    Sample Expiration      04/08/2022,2359 Performed at [facility], [place] [address] [place], [place] [address]   Ammonia     Status: None   Collection Time: 04/05/22  4:28 PM  Result Value Ref Range   Ammonia 33 9 - 35 umol/L    Comment: SLIGHT HEMOLYSIS Performed at [facility], [place] [address] [place], [place] [address]   Acetaminophen level     Status: None   Collection Time: 04/05/22  4:28 PM  Result Value Ref Range   Acetaminophen (Tylenol), Serum 29 10 - 30 ug/mL    Comment: (NOTE) Therapeutic concentrations vary significantly. A range of 10-30 ug/mL  may be an effective concentration for many patients. However, some  are best treated at concentrations outside of this range. Acetaminophen concentrations >150 ug/mL at 4 hours after ingestion  and >50 ug/mL at 12 hours after ingestion are often associated with  toxic reactions.  Performed at [facility], [place] [address] [place], [place] [address]   Urinalysis, Routine w reflex microscopic Urine, Clean Catch     Status: Abnormal   Collection Time: 04/05/22  7:37 PM  Result Value Ref Range   Color, Urine YELLOW YELLOW   APPearance CLEAR CLEAR   Specific Gravity, Urine >1.046 (H) 1.005 - 1.030   pH 5.0 5.0 - 8.0   Glucose, UA NEGATIVE NEGATIVE mg/dL   Hgb urine dipstick NEGATIVE NEGATIVE   Bilirubin Urine NEGATIVE NEGATIVE   Ketones, ur 5 (A) NEGATIVE mg/dL   Protein, ur NEGATIVE NEGATIVE mg/dL   Nitrite NEGATIVE NEGATIVE   Leukocytes,Ua NEGATIVE NEGATIVE    Comment: Performed at [facility] [address], [place], [place] [address]  Comprehensive metabolic panel     Status: Abnormal  Collection Time: 04/05/22  7:43 PM  Result Value Ref Range   Sodium 134 (L) 135 - 145 mmol/L   Potassium 4.0 3.5 - 5.1 mmol/L   Chloride 102 98 - 111 mmol/L   CO2 21 (L) 22 - 32 mmol/L   Glucose, Bld 114 (H) 70 - 99 mg/dL    Comment: Glucose reference range applies only to samples taken after fasting for at least 8 hours.   BUN 35 (H) 8 - 23 mg/dL   Creatinine, Ser 1.42 (H) 0.61 - 1.24 mg/dL   Calcium 8.3 (L) 8.9 - 10.3 mg/dL   Total Protein 5.6 (L) 6.5 - 8.1 g/dL   Albumin 3.0 (L) 3.5 - 5.0 g/dL   AST 4,675 (H) 15 - 41 U/L    Comment: RESULTS CONFIRMED BY MANUAL DILUTION   ALT 4,779 (H) 0 - 44 U/L    Comment: RESULTS CONFIRMED BY MANUAL DILUTION   Alkaline Phosphatase 78 38 - 126 U/L   Total Bilirubin 1.6 (H) 0.3 - 1.2 mg/dL   GFR, Estimated 49 (L) >60 mL/min    Comment: (NOTE) Calculated using the CKD-EPI Creatinine Equation (2021)    Anion gap 11 5 - 15    Comment: Performed at [facility] [address], [place], [place] [address]  Protime-INR     Status: Abnormal   Collection Time: 04/05/22  7:43 PM  Result Value Ref Range   Prothrombin Time 47.2 (H) 11.4 - 15.2 seconds   INR 5.2 (HH) 0.8 - 1.2    Comment: REPEATED TO VERIFY CRITICAL RESULT CALLED TO, READ BACK BY AND VERIFIED WITH: Henderson Cloud RN, 04/05/2022 2125 BTAYLOR  (NOTE) INR goal varies based on device and disease states. Performed at [facility], [place] [address] [place], [place] [address]   CBC with Differential/Platelet     Status: Abnormal   Collection Time: 04/06/22  1:22 AM  Result Value Ref Range   WBC 5.6 4.0 - 10.5 K/uL   RBC 4.02 (L) 4.22 - 5.81 MIL/uL   Hemoglobin 13.4 13.0 - 17.0 g/dL   HCT 39.2 39.0 - 52.0 %   MCV 97.5 80.0 - 100.0 fL   MCH 33.3 26.0 - 34.0 pg   MCHC 34.2 30.0 - 36.0 g/dL   RDW 18.1 (H) 11.5 - 15.5 %   Platelets 69 (L) 150 - 400 K/uL    Comment:  Immature Platelet Fraction may be clinically indicated, consider ordering this additional test [id number] REPEATED TO VERIFY PLATELET COUNT CONFIRMED BY SMEAR    nRBC 0.0 0.0 - 0.2 %   Neutrophils Relative % 88 %   Neutro Abs 5.0 1.7 - 7.7 K/uL   Lymphocytes Relative 4 %   Lymphs Abs 0.3 (L) 0.7 - 4.0 K/uL   Monocytes Relative 4 %   Monocytes Absolute 0.2 0.1 - 1.0 K/uL   Eosinophils Relative 3 %   Eosinophils Absolute 0.1 0.0 - 0.5 K/uL   Basophils Relative 0 %   Basophils Absolute 0.0 0.0 - 0.1 K/uL   Immature Granulocytes 1 %   Abs Immature Granulocytes 0.06 0.00 - 0.07 K/uL    Comment: Performed at [facility] [address], [place], [place] [address]  Comprehensive metabolic panel     Status: Abnormal   Collection Time: 04/06/22  1:22 AM  Result Value Ref Range   Sodium 133 (L) 135 - 145 mmol/L   Potassium 4.0 3.5 - 5.1 mmol/L   Chloride 104 98 - 111  mmol/L   CO2 22 22 - 32 mmol/L   Glucose, Bld 112 (H) 70 - 99 mg/dL    Comment: Glucose reference range applies only to samples taken after fasting for at least 8 hours.   BUN 31 (H) 8 - 23 mg/dL   Creatinine, Ser 1.21 0.61 - 1.24 mg/dL   Calcium 8.3 (L) 8.9 - 10.3 mg/dL   Total Protein 5.8 (L) 6.5 - 8.1 g/dL   Albumin 3.1 (L) 3.5 - 5.0 g/dL   AST 3,535 (H) 15 - 41 U/L    Comment: RESULTS CONFIRMED BY MANUAL DILUTION   ALT 4,496 (H) 0 - 44 U/L    Comment: RESULTS CONFIRMED BY MANUAL DILUTION   Alkaline Phosphatase 89 38 - 126 U/L   Total Bilirubin 1.5 (H) 0.3 - 1.2 mg/dL   GFR, Estimated 60 (L) >60 mL/min    Comment: (NOTE) Calculated using the CKD-EPI Creatinine Equation (2021)    Anion gap 7 5 - 15    Comment: Performed at [facility], [address], [place], [place] [address]  Magnesium     Status: None   Collection Time: 04/06/22  1:22 AM  Result Value Ref Range   Magnesium 1.8 1.7 - 2.4 mg/dL    Comment: Performed at [facility] [address], [place], [place] [address]  Phosphorus      Status: Abnormal   Collection Time: 04/06/22  1:22 AM  Result Value Ref Range   Phosphorus 2.4 (L) 2.5 - 4.6 mg/dL    Comment: Performed at [facility] [address] [place], [place] [address]  Protime-INR     Status: Abnormal   Collection Time: 04/06/22  1:22 AM  Result Value Ref Range   Prothrombin Time 39.8 (H) 11.4 - 15.2 seconds   INR 4.1 (HH) 0.8 - 1.2    Comment: REPEATED TO VERIFY CRITICAL RESULT CALLED TO, READ BACK BY AND VERIFIED WITH: Henderson Cloud RN 04/06/2022 0258 BTAYLOR (NOTE) INR goal varies based on device and disease states. Performed at [facility], [place] [address] [place], [place] [address]   Comprehensive metabolic panel     Status: Abnormal   Collection Time: 04/07/22  1:29 AM  Result Value Ref Range   Sodium 136 135 - 145 mmol/L   Potassium 3.7 3.5 - 5.1 mmol/L   Chloride 104 98 - 111 mmol/L   CO2 24 22 - 32 mmol/L   Glucose, Bld 111 (H) 70 - 99 mg/dL    Comment: Glucose reference range applies only to samples taken after fasting for at least 8 hours.   BUN 18 8 - 23 mg/dL   Creatinine, Ser 0.88 0.61 - 1.24 mg/dL   Calcium 8.5 (L) 8.9 - 10.3 mg/dL   Total Protein 5.5 (L) 6.5 - 8.1 g/dL   Albumin 2.7 (L) 3.5 - 5.0 g/dL   AST 747 (H) 15 - 41 U/L   ALT 2,376 (H) 0 - 44 U/L    Comment: RESULTS CONFIRMED BY MANUAL DILUTION   Alkaline Phosphatase 119 38 - 126 U/L   Total Bilirubin 1.3 (H) 0.3 - 1.2 mg/dL   GFR, Estimated >60 >60 mL/min    Comment: (NOTE) Calculated using the CKD-EPI Creatinine Equation (2021)    Anion gap 8 5 - 15    Comment: Performed at [facility], [place] [address] [place], [place] [address]  Protime-INR     Status: Abnormal   Collection Time: 04/07/22  1:29  AM  Result Value Ref Range   Prothrombin Time 22.1 (H) 11.4 - 15.2 seconds   INR 2.0 (H) 0.8 - 1.2    Comment: (NOTE) INR goal varies based on device and disease states. Performed at [facility], [place] [address] [place], [place] [address]   CBC      Status: Abnormal   Collection Time: 04/07/22  1:29 AM  Result Value Ref Range   WBC 3.6 (L) 4.0 - 10.5 K/uL   RBC 3.70 (L) 4.22 - 5.81 MIL/uL   Hemoglobin 12.5 (L) 13.0 - 17.0 g/dL   HCT 35.6 (L) 39.0 - 52.0 %   MCV 96.2 80.0 - 100.0 fL   MCH 33.8 26.0 - 34.0 pg   MCHC 35.1 30.0 - 36.0 g/dL   RDW 18.2 (H) 11.5 - 15.5 %   Platelets 98 (L) 150 - 400 K/uL    Comment: Immature Platelet Fraction may be clinically indicated, consider ordering this additional test [id number] CONSISTENT WITH PREVIOUS RESULT REPEATED TO VERIFY    nRBC 0.0 0.0 - 0.2 %    Comment: Performed at [facility], [place] [address], [place], [place] [address]  Phosphorus     Status: None   Collection Time: 04/07/22  1:29 AM  Result Value Ref Range   Phosphorus 3.0 2.5 - 4.6 mg/dL    Comment: Performed at [facility] [address], [place], [place] [address]  Magnesium     Status: None   Collection Time: 04/07/22  1:29 AM  Result Value Ref Range   Magnesium 1.7 1.7 - 2.4 mg/dL    Comment: Performed at [facility] [address], [place], [place] [address]    Studies/Results: CT Abdomen Pelvis W Contrast  Result Date: 04/05/2022 CLINICAL DATA:  Acute abdominal pain. EXAM: CT ABDOMEN AND PELVIS WITH CONTRAST TECHNIQUE: Multidetector CT imaging of the abdomen and pelvis was performed using the standard protocol following bolus administration of intravenous contrast. RADIATION DOSE REDUCTION: This exam was performed according to the departmental dose-optimization program which includes automated exposure control, adjustment of the mA and/or kV according to patient size and/or use of iterative reconstruction technique. CONTRAST:  175m OMNIPAQUE IOHEXOL 300 MG/ML  SOLN COMPARISON:  CT virtual colonoscopy 02/07/2021. FINDINGS: Lower chest: There is stable atelectasis or scarring in the lung bases. Stable 3 mm nodule in the right lower lobe image 5/37. There is a calcified granuloma in the left lung base.  Hepatobiliary: There is diffuse fatty infiltration of the liver. There is focal fat in the region of the gallbladder fossa, unchanged. Small gallstones are likely present. There is no biliary ductal dilatation. Pancreas: Unremarkable. No pancreatic ductal dilatation or surrounding inflammatory changes. Spleen: There are calcified granulomas in the spleen. There is a new peripheral small wedge-shaped hypodensity in the spleen which may represent splenic infarct. This is new from prior. Adrenals/Urinary Tract: Rounded hypodensity in the right kidney is too small to characterize, likely a cyst. Otherwise, the kidneys, adrenal glands and bladder are within normal limits. Stomach/Bowel: Stomach is within normal limits. Appendix appears normal. No evidence of bowel wall thickening, distention, or inflammatory changes. There is diffuse colonic diverticulosis without evidence for acute diverticulitis. Vascular/Lymphatic: Aortic atherosclerosis. No enlarged abdominal or pelvic lymph nodes. Reproductive: Prostate is unremarkable. Other: Small fat containing left inguinal and umbilical hernias. No ascites. Musculoskeletal: Numerous sclerotic osseous lesions are again seen most significant at T12, similar to prior. There are degenerative changes of the spine. Chronic  compression deformity of L1 is unchanged. IMPRESSION: 1. No acute process in the abdomen or pelvis. 2. 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 3. Colonic diverticulosis. 4. Stable sclerotic lesions throughout the osseous structures. 5. Fatty infiltration of the liver. 6.  Aortic Atherosclerosis (ICD10-I70.0). Electronically Signed   By: Ronney Asters M.D.   On: 04/05/2022 17:25    Medications: I have reviewed the patient's current medications.  Assessment: LFTs trending down, clinical picture compatible with ischemic hepatitis, AST 747 and ALT 2376 today, it may take several days or even weeks for LFTs to completely normalize  No encephalopathy Coagulopathy  has improved with vitamin K, INR 2 today   Plan: Discussed with patient and his family members at bedside to completely avoid alcohol. Hold Xarelto for 3 more days, resume there after. LFTs need to be retested along with PT/INR in 1 week. GI will sign off, okay to DC home from GI standpoint.  Ronnette Juniper, MD 04/07/2022, 1:32 PM

## 2022-04-07 NOTE — NC FL2 (Signed)
[place] MEDICAID FL2 LEVEL OF CARE SCREENING TOOL     IDENTIFICATION  Patient Name: Jason [name] Birthdate: 1941/01/27 Sex: male Admission Date (Current Location): 04/05/2022  [facility] and [place] Number:  Herbalist and Address:  [facility], [place] [address] [place], [place] [address]      Provider Number: [id number]  Attending Physician Name and Address:  Elgergawy, Silver Huguenin, MD  Relative Name and Phone Number:  Sharrod Achille- [phone]    Current Level of Care: Hospital Recommended Level of Care: [place] Prior Approval Number:    Date Approved/Denied:   PASRR Number: [id number]  Discharge Plan: SNF    Current Diagnoses: Patient Active Problem List   Diagnosis Date Noted   Acute hepatitis 04/05/2022   GI bleed 11/30/2020   History of COVID-19 06/17/2019   CAD (coronary artery disease) 06/17/2019   Hyperlipidemia 06/17/2019   Bilateral pneumonia 06/16/2019   History of pulmonary embolus (PE) 04/13/2019   Acute and chronic respiratory failure with hypoxia ([place])    Pulmonary emboli ([place]) 04/12/2019   COPD (chronic obstructive pulmonary disease) ([place]) 04/12/2019   Elevated troponin 04/12/2019   Pulmonary nodule 04/12/2019   Former smoker 04/12/2019   Alcohol abuse 04/12/2019    Orientation RESPIRATION BLADDER Height & Weight     Self, Place  Normal Continent Weight: 167 lb 8.8 oz (76 kg) Height:  '5\' 10"'$  (177.8 cm)  BEHAVIORAL SYMPTOMS/MOOD NEUROLOGICAL BOWEL NUTRITION STATUS      Continent Diet (see discharge summary)  AMBULATORY STATUS COMMUNICATION OF NEEDS Skin   Limited Assist Verbally Bruising                       Personal Care Assistance Level of Assistance  Bathing, Dressing Bathing Assistance: Limited assistance   Dressing Assistance: Limited assistance     Functional Limitations Info  Sight, Hearing Sight Info: Impaired Hearing Info: Impaired (very HOH)      [facility]  PT (By licensed PT), OT (By licensed OT)     PT Frequency: per facility OT Frequency: per facility            Contractures      Additional Factors Info  Code Status Code Status Info: FULL             Current Medications (04/07/2022):  This is the current hospital active medication list Current Facility-Administered Medications  Medication Dose Route Frequency Provider Last Rate Last Admin   cholecalciferol (VITAMIN D3) tablet 800 Units  800 Units Oral q AM Elgergawy, Silver Huguenin, MD   800 Units at 41/93/79 0240   folic acid (FOLVITE) tablet 1 mg  1 mg Oral Daily Elgergawy, Silver Huguenin, MD   1 mg at 04/07/22 9735   lactated ringers infusion   Intravenous Continuous Irene Pap N, DO 50 mL/hr at 04/06/22 1800 Infusion Verify at 04/06/22 1800   lactulose (CHRONULAC) 10 GM/15ML solution 20 g  20 g Oral BID PRN Irene Pap N, DO       LORazepam (ATIVAN) tablet 1-4 mg  1-4 mg Oral Q1H PRN Elgergawy, Silver Huguenin, MD       Or   LORazepam (ATIVAN) injection 1-4 mg  1-4 mg Intravenous Q1H PRN Elgergawy, Silver Huguenin, MD       mometasone-formoterol (DULERA) 200-5 MCG/ACT inhaler 2 puff  2 puff Inhalation BID Elgergawy, Silver Huguenin, MD   2 puff at 04/07/22 0817   multivitamin with minerals  tablet 1 tablet  1 tablet Oral Daily Elgergawy, Silver Huguenin, MD   1 tablet at 04/07/22 0950   oxyCODONE (Oxy IR/ROXICODONE) immediate release tablet 5 mg  5 mg Oral Q6H PRN Irene Pap N, DO       phosphorus (K PHOS NEUTRAL) tablet 500 mg  500 mg Oral TID Elgergawy, Silver Huguenin, MD   500 mg at 04/07/22 0950   prochlorperazine (COMPAZINE) injection 5 mg  5 mg Intravenous Q6H PRN Irene Pap N, DO       tamsulosin (FLOMAX) capsule 0.4 mg  0.4 mg Oral QPC breakfast Elgergawy, Silver Huguenin, MD   0.4 mg at 04/07/22 0950   thiamine tablet 100 mg  100 mg Oral Daily Elgergawy, Silver Huguenin, MD       Or   thiamine (B-1) injection 100 mg  100 mg Intravenous Daily Elgergawy, Silver Huguenin, MD   100 mg at 04/07/22 0950   traZODone  (DESYREL) tablet 100 mg  100 mg Oral QHS PRN Elgergawy, Silver Huguenin, MD   100 mg at 04/06/22 2305     Discharge Medications: Please see discharge summary for a list of discharge medications.  Relevant Imaging Results:  Relevant Lab Results:   Additional Information SSN: [id number]  [place], LCSWA

## 2022-04-07 NOTE — Progress Notes (Signed)
PROGRESS NOTE    Jason [name]  AST:[id number] DOB: [birth year] DOA: 04/05/2022 PCP: Lujean Amel, MD    Chief Complaint  Patient presents with   Abnormal Lab    Brief Narrative:   Jason [name] is a 81 y.o. male with medical history significant for pulmonary embolism on Xarelto, essential hypertension, hyperlipidemia, GERD, alcoholism, who presented to [facility] ED at the recommendation of his PCP due to abnormal labs and generalized weakness.  Patient initially went to his PCP due to generalized weakness lasting for the past few weeks.  Labs were drawn and resulted with new severe thrombocytopenia with platelet count in the 40s, and severely elevated LFTs greater than 3000.  Patient was advised to go to the ED for further evaluation and management.  No hematemesis, hematochezia or melena.  Denies overt bleeding.  Last dose of Xarelto was taken this morning.   Upon presentation to the ED, the patient is hypotensive with SBP's in the 80s, improved after 500 cc normal saline IV fluid bolus.  Labs in the ED notable for severely elevated liver chemistries, AST and ALT greater than 6000, INR 6.1.  CT abdomen and pelvis with contrast was unrevealing.  No acute process in the abdomen or pelvis.  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  Colonic diverticulosis.  Fatty infiltration of the liver.  Aortic atherosclerosis.    EDP discussed the case with [facility], Dr. Therisa Doyne, who recommended admission for supportive care, vitamin K, and to trend INR.  The patient is alert and oriented x4.  No evidence of hepatic encephalopathy.  EDP also discussed the case with hepatology at [facility] who recommended supportive care for now since the patient has no encephalopathy.  In the ED the patient received 1 dose of IV vitamin K 10 mg x 1 and NS 500 cc bolus.  EDP requested admission by hospitalist service.  Admitted by [facility].   ED Course: Tmax 97.5.  BP 116/65, pulse 64, respiratory rate 24, saturation 96% on room air.  Lab studies  significant for serum sodium 134, serum bicarb 20, glucose 122, BUN 37, creatinine 1.74, GFR 39, with baseline creatinine of 1.05 and GFR greater than 60.  AST 7671, ALT 6582, ammonia 33, T. bili 1.9.  Platelet count, platelet clumps noted on smear unable to estimate, recommended repeat to verify.   Assessment & Plan:   Principal Problem:   Acute hepatitis Active Problems:   Pulmonary emboli (HCC)   COPD (chronic obstructive pulmonary disease) (HCC)   Alcohol abuse  Transaminitis - due Alcohol abuse and ischemic hepatitis. -If hypotension initially, avoid low blood pressure, so far acceptable on IV fluids, if needed will add midodrine, will give albumin -Continue to trend CMP daily. -Avoid hepatotoxic medications. Ammonia WNL 33-no encephalopathy Acute hepatitis virus panel negative. CT abdomen pelvis with contrast was unrevealing for any acute intra-abdominal pelvic findings. -Liver enzymes are trending down nicely, they remain significantly elevated still, continue to trend closely, AST 747, ALT 2376.  Coagulopathy -Due to Xarelto and alcohol use, no evidence of bleeding. -Vitamin K x3 days -Continue to hold Xarelto until PT/INR normalizes per GI recommendation.  Resume Xarelto after 3 days.  AKI, likely prerenal Baseline creatinine appears to be 1.0 with GFR greater than 60 Presented with creatinine 1.74 with GFR of 39 Improving with IV fluids, avoid hypotension   History of pulmonary embolism on Xarelto Hold off anticoagulation, home Xarelto, in the setting of coagulopathy   Hypovolemic hyponatremia Return hydration   Hypertension, currently hypotensive on  presentation Hold off home oral antihypertensives   GERD Continue with PPI   Hyperlipidemia Hold off home statin due to severely elevated liver chemistries   Alcohol use disorder Reports last alcohol intake, liquor was 5 days ago. Continue with CIWA protocol  Hypophosphatemia -Repleted, continue to  monitor    DVT prophylaxis: scd, xarelto on hold due to coagulopathy Code Status: Full Family Communication: Daughter at bedside Disposition:   Status is: Inpatient    Consultants:  GI   Subjective:  No significant events overnight, denies any complaints  Objective: Vitals:   04/07/22 0400 04/07/22 0749 04/07/22 0818 04/07/22 1144  BP: (!) 111/55 (!) 100/41  113/67  Pulse: 67 (!) 54  86  Resp: (!) '22 19  19  '$ Temp:  (!) 97.5 F (36.4 C)  98.6 F (37 C)  TempSrc:  Axillary  Oral  SpO2: 94% 97% 96% 92%  Weight:      Height:        Intake/Output Summary (Last 24 hours) at 04/07/2022 1447 Last data filed at 04/07/2022 0700 Gross per 24 hour  Intake 1057.21 ml  Output 900 ml  Net 157.21 ml   Filed Weights   04/05/22 1515 04/06/22 0500  Weight: 75.3 kg 76 kg    Examination:  Awake Alert, Oriented X 3, No new F.N deficits, Normal affect Symmetrical Chest wall movement, Good air movement bilaterally, CTAB RRR,No Gallops,Rubs or new Murmurs, No Parasternal Heave +ve B.Sounds, Abd Soft, No tenderness, No rebound - guarding or rigidity. No Cyanosis, Clubbing or edema, No new Rash or bruise       Data Reviewed: I have personally reviewed following labs and imaging studies  CBC: Recent Labs  Lab 04/05/22 1523 04/06/22 0122 04/07/22 0129  WBC 5.7 5.6 3.6*  NEUTROABS 4.7 5.0  --   HGB 14.2 13.4 12.5*  HCT 43.9 39.2 35.6*  MCV 101.4* 97.5 96.2  PLT PLATELET CLUMPS NOTED ON SMEAR, UNABLE TO ESTIMATE 69* 98*    Basic Metabolic Panel: Recent Labs  Lab 04/05/22 1523 04/05/22 1943 04/06/22 0122 04/07/22 0129  NA 134* 134* 133* 136  K 4.2 4.0 4.0 3.7  CL 101 102 104 104  CO2 20* 21* 22 24  GLUCOSE 122* 114* 112* 111*  BUN 37* 35* 31* 18  CREATININE 1.74* 1.42* 1.21 0.88  CALCIUM 8.8* 8.3* 8.3* 8.5*  MG  --   --  1.8 1.7  PHOS  --   --  2.4* 3.0    GFR: Estimated Creatinine Clearance: 66.8 mL/min (by C-G formula based on SCr of 0.88 mg/dL).  Liver  Function Tests: Recent Labs  Lab 04/05/22 1523 04/05/22 1943 04/06/22 0122 04/07/22 0129  AST 7,671* 4,675* 3,535* 747*  ALT 6,582* 4,779* 4,496* 2,376*  ALKPHOS 86 78 89 119  BILITOT 1.9* 1.6* 1.5* 1.3*  PROT 6.6 5.6* 5.8* 5.5*  ALBUMIN 3.5 3.0* 3.1* 2.7*    CBG: No results for input(s): GLUCAP in the last 168 hours.   No results found for this or any previous visit (from the past 240 hour(s)).       Radiology Studies: CT Abdomen Pelvis W Contrast  Result Date: 04/05/2022 CLINICAL DATA:  Acute abdominal pain. EXAM: CT ABDOMEN AND PELVIS WITH CONTRAST TECHNIQUE: Multidetector CT imaging of the abdomen and pelvis was performed using the standard protocol following bolus administration of intravenous contrast. RADIATION DOSE REDUCTION: This exam was performed according to the departmental dose-optimization program which includes automated exposure control, adjustment of the mA and/or kV according  to patient size and/or use of iterative reconstruction technique. CONTRAST:  170m OMNIPAQUE IOHEXOL 300 MG/ML  SOLN COMPARISON:  CT virtual colonoscopy 02/07/2021. FINDINGS: Lower chest: There is stable atelectasis or scarring in the lung bases. Stable 3 mm nodule in the right lower lobe image 5/37. There is a calcified granuloma in the left lung base. Hepatobiliary: There is diffuse fatty infiltration of the liver. There is focal fat in the region of the gallbladder fossa, unchanged. Small gallstones are likely present. There is no biliary ductal dilatation. Pancreas: Unremarkable. No pancreatic ductal dilatation or surrounding inflammatory changes. Spleen: There are calcified granulomas in the spleen. There is a new peripheral small wedge-shaped hypodensity in the spleen which may represent splenic infarct. This is new from prior. Adrenals/Urinary Tract: Rounded hypodensity in the right kidney is too small to characterize, likely a cyst. Otherwise, the kidneys, adrenal glands and bladder are  within normal limits. Stomach/Bowel: Stomach is within normal limits. Appendix appears normal. No evidence of bowel wall thickening, distention, or inflammatory changes. There is diffuse colonic diverticulosis without evidence for acute diverticulitis. Vascular/Lymphatic: Aortic atherosclerosis. No enlarged abdominal or pelvic lymph nodes. Reproductive: Prostate is unremarkable. Other: Small fat containing left inguinal and umbilical hernias. No ascites. Musculoskeletal: Numerous sclerotic osseous lesions are again seen most significant at T12, similar to prior. There are degenerative changes of the spine. Chronic compression deformity of L1 is unchanged. IMPRESSION: 1. No acute process in the abdomen or pelvis. 2. 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 3. Colonic diverticulosis. 4. Stable sclerotic lesions throughout the osseous structures. 5. Fatty infiltration of the liver. 6.  Aortic Atherosclerosis (ICD10-I70.0). Electronically Signed   By: ARonney AstersM.D.   On: 04/05/2022 17:25        Scheduled Meds:  cholecalciferol  800 Units Oral q AM   feeding supplement  237 mL Oral BID BM   folic acid  1 mg Oral Daily   mometasone-formoterol  2 puff Inhalation BID   multivitamin with minerals  1 tablet Oral Daily   phosphorus  500 mg Oral TID   tamsulosin  0.4 mg Oral QPC breakfast   thiamine  100 mg Oral Daily   Or   thiamine  100 mg Intravenous Daily   Continuous Infusions:  lactated ringers 50 mL/hr at 04/06/22 1800     LOS: 2 days       DPhillips Climes MD [facility]   To contact the attending provider between 7A-7P or the covering provider during after hours 7P-7A, please log into the web site [email or web address] and access using universal [place] password for that web site. If you do not have the password, please call the hospital operator.  04/07/2022, 2:47 PM

## 2022-04-08 DIAGNOSIS — K7200 Acute and subacute hepatic failure without coma: Secondary | ICD-10-CM | POA: Diagnosis not present

## 2022-04-08 DIAGNOSIS — N179 Acute kidney failure, unspecified: Secondary | ICD-10-CM | POA: Diagnosis not present

## 2022-04-08 DIAGNOSIS — B179 Acute viral hepatitis, unspecified: Secondary | ICD-10-CM | POA: Diagnosis not present

## 2022-04-08 LAB — MAGNESIUM: Magnesium: 1.8 mg/dL (ref 1.7–2.4)

## 2022-04-08 LAB — COMPREHENSIVE METABOLIC PANEL
ALT: 1449 U/L — ABNORMAL HIGH (ref 0–44)
AST: 256 U/L — ABNORMAL HIGH (ref 15–41)
Albumin: 2.7 g/dL — ABNORMAL LOW (ref 3.5–5.0)
Alkaline Phosphatase: 125 U/L (ref 38–126)
Anion gap: 6 (ref 5–15)
BUN: 10 mg/dL (ref 8–23)
CO2: 23 mmol/L (ref 22–32)
Calcium: 8.3 mg/dL — ABNORMAL LOW (ref 8.9–10.3)
Chloride: 107 mmol/L (ref 98–111)
Creatinine, Ser: 0.82 mg/dL (ref 0.61–1.24)
GFR, Estimated: >60 mL/min (ref >60)
Glucose, Bld: 110 mg/dL — ABNORMAL HIGH (ref 70–99)
Potassium: 3.6 mmol/L (ref 3.5–5.1)
Sodium: 136 mmol/L (ref 135–145)
Total Bilirubin: 1 mg/dL (ref 0.3–1.2)
Total Protein: 5.4 g/dL — ABNORMAL LOW (ref 6.5–8.1)

## 2022-04-08 LAB — CBC
HCT: 34.8 % — ABNORMAL LOW (ref 39.0–52.0)
Hemoglobin: 11.9 g/dL — ABNORMAL LOW (ref 13.0–17.0)
MCH: 33.2 pg (ref 26.0–34.0)
MCHC: 34.2 g/dL (ref 30.0–36.0)
MCV: 97.2 fL (ref 80.0–100.0)
Platelets: 133 10*3/uL — ABNORMAL LOW (ref 150–400)
RBC: 3.58 MIL/uL — ABNORMAL LOW (ref 4.22–5.81)
RDW: 18.2 % — ABNORMAL HIGH (ref 11.5–15.5)
WBC: 3.3 10*3/uL — ABNORMAL LOW (ref 4.0–10.5)
nRBC: 0 % (ref 0.0–0.2)

## 2022-04-08 LAB — PROTIME-INR
INR: 1.3 — ABNORMAL HIGH (ref 0.8–1.2)
Prothrombin Time: 15.9 seconds — ABNORMAL HIGH (ref 11.4–15.2)

## 2022-04-08 LAB — PHOSPHORUS: Phosphorus: 3.4 mg/dL (ref 2.5–4.6)

## 2022-04-08 MED ORDER — LACTATED RINGERS IV SOLN: INTRAVENOUS | Status: DC

## 2022-04-08 NOTE — TOC Progression Note (Signed)
Transition of Care [facility]) - Progression Note    Patient Details  Name: Jason [name] MRN: [id number] Date of Birth: [birth year]  Transition of Care [facility]) CM/SW [place], RN Phone Number: 04/08/2022, 2:25 PM  Clinical Narrative:    Spoke to patient and son, Jason [name], regarding transition needs.Therapy now recommends home health. Patient defers to [facility] to find highly rated agency. Cory with [facility] accepted referral. Son will find transportation for early discharge tomorrow. Patient has walker at home. Address, Phone number and PCP verified.    Expected Discharge Plan: [place] Barriers to Discharge: Continued Medical Work up  Expected Discharge Plan and Services Expected Discharge Plan: [place] Choice: [place] arrangements for the past 2 months: Single Family Home                           HH Arranged: RN, PT, OT, Social Work, Nurse's Aide [place] Agency: [place] Date [place]: 04/08/22 Time [place]: [id number] Representative spoke with at [place]: [place] ([place]) Interventions    Readmission Risk Interventions     View : No data to display.

## 2022-04-08 NOTE — Progress Notes (Signed)
Physical Therapy Treatment Patient Details Name: Jason [name] MRN: [id number] DOB: 1941/03/02 Today's Date: 04/08/2022   History of Present Illness 81 y/o male presented to ED on 04/05/22 for abnormal labs and generalized weakness. Admitted for severe thrombocytopenia and severely elevated LFTs. PMH: hx of PE on Xarelto, HTN, alcoholism    PT Comments    Patient doing much better today. Ambulated without imbalance x 250 ft with no device. Discharge plan updated to home with HHPT and no DME needs. MD, RN, LCSW, and Case Manager all notified.    Recommendations for follow up therapy are one component of a multi-disciplinary discharge planning process, led by the attending physician.  Recommendations may be updated based on patient status, additional functional criteria and insurance authorization.  Follow Up Recommendations  Home health PT     Assistance Recommended at Discharge Set up Supervision/Assistance  Patient can return home with the following A little help with bathing/dressing/bathroom;Assistance with cooking/housework   Equipment Recommendations  None recommended by PT    Recommendations for Other Services       Precautions / Restrictions Precautions Precautions: Fall Restrictions Weight Bearing Restrictions: No     Mobility  Bed Mobility Overal bed mobility: Needs Assistance Bed Mobility: Supine to Sit, Sit to Supine     Supine to sit: Independent Sit to supine: Independent        Transfers Overall transfer level: Needs assistance Equipment used: None Transfers: Sit to/from Stand Sit to Stand: Supervision           General transfer comment: for safety due to imbalance 6/4; no imbalance today    Ambulation/Gait Ambulation/Gait assistance: Min guard, Supervision Gait Distance (Feet): 250 Feet Assistive device: None Gait Pattern/deviations: Step-through pattern, Decreased stride length   Gait velocity interpretation: >2.62 ft/sec, indicative of  community ambulatory   General Gait Details: no imbalance; closeguarding initially due to h/o imbalance 6/4   Stairs             Wheelchair Mobility    Modified Rankin (Stroke Patients Only)       Balance Overall balance assessment: Needs assistance Sitting-balance support: No upper extremity supported, Feet supported Sitting balance-Leahy Scale: Good     Standing balance support: No upper extremity supported, During functional activity Standing balance-Leahy Scale: Fair                              Cognition Arousal/Alertness: Awake/alert Behavior During Therapy: WFL for tasks assessed/performed Overall Cognitive Status: Within Functional Limits for tasks assessed                                          Exercises      General Comments General comments (skin integrity, edema, etc.): pt reports he walked on unit with his son a few times since PT saw him last      Pertinent Vitals/Pain Pain Assessment Pain Assessment: No/denies pain    Home Living                          Prior Function            PT Goals (current goals can now be found in the care plan section) Acute Rehab PT Goals Patient Stated Goal: did not state. Son wants patient to be independent PT Goal Formulation:  With patient Time For Goal Achievement: 04/20/22 Potential to Achieve Goals: Good Progress towards PT goals: Progressing toward goals    Frequency    Min 3X/week      PT Plan Discharge plan needs to be updated    Co-evaluation              AM-PAC PT "6 Clicks" Mobility   Outcome Measure  Help needed turning from your back to your side while in a flat bed without using bedrails?: None Help needed moving from lying on your back to sitting on the side of a flat bed without using bedrails?: None Help needed moving to and from a bed to a chair (including a wheelchair)?: A Little Help needed standing up from a chair using your  arms (e.g., wheelchair or bedside chair)?: A Little Help needed to walk in hospital room?: A Little Help needed climbing 3-5 steps with a railing? : A Little 6 Click Score: 20    End of Session Equipment Utilized During Treatment: Gait belt Activity Tolerance: Patient tolerated treatment well Patient left: in bed;with call bell/phone within reach;with family/visitor present;with bed alarm set Nurse Communication: Mobility status;Other (comment) (updated discharge plan) PT Visit Diagnosis: Muscle weakness (generalized) (M62.81);Unsteadiness on feet (R26.81);Difficulty in walking, not elsewhere classified (R26.2)     Time: 2947-6546 PT Time Calculation (min) (ACUTE ONLY): 11 min  Charges:  $Gait Training: 8-22 mins                      Arby Barrette, PT Acute Rehabilitation Services  Office [phone]    Rexanne Mano 04/08/2022, 12:13 PM

## 2022-04-08 NOTE — Progress Notes (Signed)
PROGRESS NOTE    Jason [name]  CZY:[id number] DOB: Jul 15, 1941 DOA: 04/05/2022 PCP: Lujean Amel, MD    Chief Complaint  Patient presents with   Abnormal Lab    Brief Narrative:   Jason [name] is a 81 y.o. male with medical history significant for pulmonary embolism on Xarelto, essential hypertension, hyperlipidemia, GERD, alcoholism, who presented to [facility] ED at the recommendation of his PCP due to abnormal labs and generalized weakness.  Patient initially went to his PCP due to generalized weakness lasting for the past few weeks.  Labs were drawn and resulted with new severe thrombocytopenia with platelet count in the 40s, and severely elevated LFTs greater than 3000.  Patient was advised to go to the ED for further evaluation and management.  No hematemesis, hematochezia or melena.  Denies overt bleeding.  Last dose of Xarelto was taken this morning.   Upon presentation to the ED, the patient is hypotensive with SBP's in the 80s, improved after 500 cc normal saline IV fluid bolus.  Labs in the ED notable for severely elevated liver chemistries, AST and ALT greater than 6000, INR 6.1.  CT abdomen and pelvis with contrast was unrevealing.  No acute process in the abdomen or pelvis.  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  Colonic diverticulosis.  Fatty infiltration of the liver.  Aortic atherosclerosis.    EDP discussed the case with [facility], Dr. Therisa Doyne, who recommended admission for supportive care, vitamin K, and to trend INR.  The patient is alert and oriented x4.  No evidence of hepatic encephalopathy.  EDP also discussed the case with hepatology at [facility] who recommended supportive care for now since the patient has no encephalopathy.  In the ED the patient received 1 dose of IV vitamin K 10 mg x 1 and NS 500 cc bolus.  EDP requested admission by hospitalist service.  Admitted by [facility].   ED Course: Tmax 97.5.  BP 116/65, pulse 64, respiratory rate 24, saturation 96% on room air.  Lab studies  significant for serum sodium 134, serum bicarb 20, glucose 122, BUN 37, creatinine 1.74, GFR 39, with baseline creatinine of 1.05 and GFR greater than 60.  AST 7671, ALT 6582, ammonia 33, T. bili 1.9.  Platelet count, platelet clumps noted on smear unable to estimate, recommended repeat to verify.   Assessment & Plan:   Principal Problem:   Acute hepatitis Active Problems:   Pulmonary emboli (HCC)   COPD (chronic obstructive pulmonary disease) (HCC)   Alcohol abuse  Transaminitis - due Alcohol abuse and ischemic hepatitis. -If hypotension initially, avoid low blood pressure, so far acceptable on IV fluids, if needed will add midodrine, will give albumin -Continue to trend CMP daily. -Avoid hepatotoxic medications. Ammonia WNL 33-no encephalopathy Acute hepatitis virus panel negative. CT abdomen pelvis with contrast was unrevealing for any acute intra-abdominal pelvic findings. -Liver enzymes are trending down nicely, AST this morning 256, ALT at 1449, total bili normalized at 1, and alk phos within normal limit as well.   Coagulopathy -Due to Xarelto and alcohol use, no evidence of bleeding. -Vitamin K x3 days -INR improved to 1.3 today.  Resume Xarelto after 2 days.  AKI, likely prerenal Baseline creatinine appears to be 1.0 with GFR greater than 60 Presented with creatinine 1.74 with GFR of 39 Improving with IV fluids, avoid hypotension   History of pulmonary embolism on Xarelto Hold off anticoagulation, home Xarelto, in the setting of coagulopathy, but he is on DVT prophylaxis dose with Lovenox  Hypovolemic hyponatremia Return hydration   Hypertension, currently hypotensive on presentation Hold off home oral antihypertensives   GERD Continue with PPI   Hyperlipidemia Hold off home statin due to severely elevated liver chemistries   Alcohol use disorder Reports last alcohol intake, liquor was 5 days ago. Continue with CIWA protocol  Hypophosphatemia -Repleted,  continue to monitor    DVT prophylaxis: scd, xarelto on hold due to coagulopathy, on Lovenox Code Status: Full Family Communication: discussed with son by phone Disposition: Home with home health tomorrow  Status is: Inpatient    Consultants:  GI   Subjective:  No significant events overnight, denies any complaints  Objective: Vitals:   04/08/22 0351 04/08/22 0748 04/08/22 0750 04/08/22 1135  BP:  129/65  129/76  Pulse: 61 65 68 88  Resp: 19 (!) $Remo'23 20 20  'nekZO$ Temp:  98.4 F (36.9 C)  98.4 F (36.9 C)  TempSrc:  Oral  Oral  SpO2: 94% 94% 96% 92%  Weight:      Height:        Intake/Output Summary (Last 24 hours) at 04/08/2022 1315 Last data filed at 04/08/2022 0552 Gross per 24 hour  Intake --  Output 525 ml  Net -525 ml   Filed Weights   04/05/22 1515 04/06/22 0500  Weight: 75.3 kg 76 kg    Examination:  Awake Alert, Oriented X 3, No new F.N deficits, Normal affect, hard of hearing Symmetrical Chest wall movement, Good air movement bilaterally, CTAB RRR,No Gallops,Rubs or new Murmurs, No Parasternal Heave +ve B.Sounds, Abd Soft, No tenderness, No rebound - guarding or rigidity. No Cyanosis, Clubbing or edema, No new Rash or bruise        Data Reviewed: I have personally reviewed following labs and imaging studies  CBC: Recent Labs  Lab 04/05/22 1523 04/06/22 0122 04/07/22 0129 04/08/22 0148  WBC 5.7 5.6 3.6* 3.3*  NEUTROABS 4.7 5.0  --   --   HGB 14.2 13.4 12.5* 11.9*  HCT 43.9 39.2 35.6* 34.8*  MCV 101.4* 97.5 96.2 97.2  PLT PLATELET CLUMPS NOTED ON SMEAR, UNABLE TO ESTIMATE 69* 98* 133*    Basic Metabolic Panel: Recent Labs  Lab 04/05/22 1523 04/05/22 1943 04/06/22 0122 04/07/22 0129 04/08/22 0148  NA 134* 134* 133* 136 136  K 4.2 4.0 4.0 3.7 3.6  CL 101 102 104 104 107  CO2 20* 21* $Remov'22 24 23  'lDgEeD$ GLUCOSE 122* 114* 112* 111* 110*  BUN 37* 35* 31* 18 10  CREATININE 1.74* 1.42* 1.21 0.88 0.82  CALCIUM 8.8* 8.3* 8.3* 8.5* 8.3*  MG  --   --   1.8 1.7 1.8  PHOS  --   --  2.4* 3.0 3.4    GFR: Estimated Creatinine Clearance: 71.7 mL/min (by C-G formula based on SCr of 0.82 mg/dL).  Liver Function Tests: Recent Labs  Lab 04/05/22 1523 04/05/22 1943 04/06/22 0122 04/07/22 0129 04/08/22 0148  AST 7,671* 4,675* 3,535* 747* 256*  ALT 6,582* 4,779* 4,496* 2,376* 1,449*  ALKPHOS 86 78 89 119 125  BILITOT 1.9* 1.6* 1.5* 1.3* 1.0  PROT 6.6 5.6* 5.8* 5.5* 5.4*  ALBUMIN 3.5 3.0* 3.1* 2.7* 2.7*    CBG: No results for input(s): GLUCAP in the last 168 hours.   No results found for this or any previous visit (from the past 240 hour(s)).       Radiology Studies: No results found.      Scheduled Meds:  cholecalciferol  800 Units Oral q AM   enoxaparin (  LOVENOX) injection  40 mg Subcutaneous Q24H   feeding supplement  237 mL Oral BID BM   folic acid  1 mg Oral Daily   mometasone-formoterol  2 puff Inhalation BID   multivitamin with minerals  1 tablet Oral Daily   tamsulosin  0.4 mg Oral QPC breakfast   thiamine  100 mg Oral Daily   Or   thiamine  100 mg Intravenous Daily   Continuous Infusions:     LOS: 3 days       Phillips Climes, MD [facility]   To contact the attending provider between 7A-7P or the covering provider during after hours 7P-7A, please log into the web site [email or web address] and access using universal [place] password for that web site. If you do not have the password, please call the hospital operator.  04/08/2022, 1:15 PM

## 2022-04-08 NOTE — Plan of Care (Signed)

## 2022-04-09 DIAGNOSIS — K7200 Acute and subacute hepatic failure without coma: Secondary | ICD-10-CM | POA: Diagnosis not present

## 2022-04-09 DIAGNOSIS — F1010 Alcohol abuse, uncomplicated: Secondary | ICD-10-CM | POA: Diagnosis not present

## 2022-04-09 DIAGNOSIS — N179 Acute kidney failure, unspecified: Secondary | ICD-10-CM | POA: Diagnosis not present

## 2022-04-09 DIAGNOSIS — B179 Acute viral hepatitis, unspecified: Secondary | ICD-10-CM | POA: Diagnosis not present

## 2022-04-09 LAB — COMPREHENSIVE METABOLIC PANEL
ALT: 1011 U/L — ABNORMAL HIGH (ref 0–44)
AST: 130 U/L — ABNORMAL HIGH (ref 15–41)
Albumin: 2.7 g/dL — ABNORMAL LOW (ref 3.5–5.0)
Alkaline Phosphatase: 122 U/L (ref 38–126)
Anion gap: 4 — ABNORMAL LOW (ref 5–15)
BUN: 12 mg/dL (ref 8–23)
CO2: 24 mmol/L (ref 22–32)
Calcium: 8.6 mg/dL — ABNORMAL LOW (ref 8.9–10.3)
Chloride: 111 mmol/L (ref 98–111)
Creatinine, Ser: 0.8 mg/dL (ref 0.61–1.24)
GFR, Estimated: >60 mL/min (ref >60)
Glucose, Bld: 113 mg/dL — ABNORMAL HIGH (ref 70–99)
Potassium: 3.8 mmol/L (ref 3.5–5.1)
Sodium: 139 mmol/L (ref 135–145)
Total Bilirubin: 0.6 mg/dL (ref 0.3–1.2)
Total Protein: 5.6 g/dL — ABNORMAL LOW (ref 6.5–8.1)

## 2022-04-09 LAB — PROTIME-INR
INR: 1 (ref 0.8–1.2)
Prothrombin Time: 13.4 seconds (ref 11.4–15.2)

## 2022-04-09 LAB — CBC
HCT: 35 % — ABNORMAL LOW (ref 39.0–52.0)
Hemoglobin: 11.7 g/dL — ABNORMAL LOW (ref 13.0–17.0)
MCH: 33.2 pg (ref 26.0–34.0)
MCHC: 33.4 g/dL (ref 30.0–36.0)
MCV: 99.4 fL (ref 80.0–100.0)
Platelets: 155 10*3/uL (ref 150–400)
RBC: 3.52 MIL/uL — ABNORMAL LOW (ref 4.22–5.81)
RDW: 18.5 % — ABNORMAL HIGH (ref 11.5–15.5)
WBC: 4.2 10*3/uL (ref 4.0–10.5)
nRBC: 0 % (ref 0.0–0.2)

## 2022-04-09 LAB — PHOSPHORUS: Phosphorus: 3.1 mg/dL (ref 2.5–4.6)

## 2022-04-09 LAB — MAGNESIUM: Magnesium: 1.7 mg/dL (ref 1.7–2.4)

## 2022-04-09 MED ORDER — THIAMINE HCL 100 MG PO TABS: 100.0000 mg | ORAL_TABLET | Freq: Every day | ORAL | 0 refills | Status: DC

## 2022-04-09 MED ORDER — K PHOS MONO-SOD PHOS DI & MONO 155-852-130 MG PO TABS
500.0000 mg | ORAL_TABLET | Freq: Once | ORAL | Status: AC
  Administered 2022-04-09: 500 mg via ORAL
  Filled 2022-04-09: qty 2

## 2022-04-09 MED ORDER — FOLIC ACID 1 MG PO TABS: 1.0000 mg | ORAL_TABLET | Freq: Every day | ORAL | 0 refills | Status: DC

## 2022-04-09 NOTE — Progress Notes (Signed)
6/6 Pt hard of hearing and did not have hearing aides. Left message for son Emrah Ariola [phone]) IM Letter will be mailed to the address on file. AMM

## 2022-04-09 NOTE — Discharge Summary (Signed)
Physician Discharge Summary  Jason [name] QAS:[id number] DOB: [birth year] DOA: 04/05/2022  PCP: Lujean Amel, MD  Admit date: 04/05/2022 Discharge date: 04/09/2022  Admitted From: Hom Disposition:  Home  Recommendations for Outpatient Follow-up:  Follow up with PCP in 1 weeks Continue counseling about alcohol abuse and cessation. Check LFT, BMP and INR next week Patient to resume his Xarelto from this evening. Antihypertensives has been held on discharge, can be resumed at a later date if blood pressure started to increase. Resume statin once liver function normalized  Home Health:YES   Discharge Condition:Stable CODE STATUS:FULL Diet recommendation: Heart Healthy     Brief/Interim Summary:  Jason [name] is a 81 y.o. male with medical history significant for pulmonary embolism on Xarelto, essential hypertension, hyperlipidemia, GERD, alcoholism, who presented to [facility] ED at the recommendation of his PCP due to abnormal labs and generalized weakness.  Patient initially went to his PCP due to generalized weakness lasting for the past few weeks.  Labs were drawn and resulted with new severe thrombocytopenia with platelet count in the 40s, and severely elevated LFTs greater than 3000.  Patient was advised to go to the ED for further evaluation and management.  No hematemesis, hematochezia or melena.  Denies overt bleeding.  Last dose of Xarelto was taken at the day of the admission, patient endorses heavy alcohol abuse, he was seen by GI who felt his transaminitis related to ischemic hepatitis, where he was hydrated, and received vitamin K, please see discussion below.   Discharge Diagnoses:  Principal Problem:   Acute hepatitis Active Problems:   Pulmonary emboli (HCC)   COPD (chronic obstructive pulmonary disease) (HCC)   Alcohol abuse  Transaminitis - due Alcohol abuse and ischemic hepatitis. -with hypotension initially, he was treated with IV fluids, blood pressure has been soft to  acceptable during hospital stay. -LFTs has significantly improved, trending down, cleared by GI for discharge, please repeat LFTs, INR in 1 week from discharge. -Tylenol and statin stopped at time of discharge. -Avoid hepatotoxic medications. - Ammonia WNL 33-no encephalopathy - Acute hepatitis virus panel negative. - CT abdomen pelvis with contrast was unrevealing for any acute intra-abdominal pelvic findings.    Coagulopathy -Due to Xarelto and alcohol use, no evidence of bleeding.  He received vitamin K x3 days, INR is 1 today, Xarelto has been held during hospital stay but he was kept on DVT prophylaxis dose Lovenox, to resume Xarelto this evening.  AKI, likely prerenal Baseline creatinine appears to be 1.0 with GFR greater than 60 Presented with creatinine 1.74 with GFR of 39 This has resolved   History of pulmonary embolism on Xarelto Did hold home Xarelto, in the setting of coagulopathy during hospital stay, he was kept on DVT prophylaxis dose low, he is to resume Xarelto today..   Hypovolemic hyponatremia Resolved with IV hydration.   Hypertension, currently hypotensive on presentation Antihypertensive medications has been held during hospital stay, blood pressure has been soft but has improved with IV fluids, blood pressures currently acceptable, so we will hold antihypertensive regimen at discharge especially he presents with ischemic hepatitis, medication can be resumed as an outpatient if blood pressure started to increase.   GERD Continue with PPI   Hyperlipidemia Hold off home statin due to severely elevated liver chemistries   Alcohol use disorder Was kept on CIWA protocol during hospital stay, no evidence of withdrawals, he will be discharged on thiamine and folic acid  Hypophosphatemia -Repleted, continue to monitor      Discharge Instructions  Discharge Instructions     Diet - low sodium heart healthy   Complete by: As directed    Discharge instructions    Complete by: As directed    Follow with Primary MD Koirala, Dibas, MD in 7 days   Get CBC, CMP, INR checked  by Primary MD next visit.    Activity: As tolerated with Full fall precautions use walker/cane & assistance as needed   Disposition Home    Diet: Heart Healthy   On your next visit with your primary care physician please Get Medicines reviewed and adjusted.   Please request your Prim.MD to go over all Hospital Tests and Procedure/Radiological results at the follow up, please get all Hospital records sent to your Prim MD by signing hospital release before you go home.   If you experience worsening of your admission symptoms, develop shortness of breath, life threatening emergency, suicidal or homicidal thoughts you must seek medical attention immediately by calling 911 or calling your MD immediately  if symptoms less severe.  You Must read complete instructions/literature along with all the possible adverse reactions/side effects for all the Medicines you take and that have been prescribed to you. Take any new Medicines after you have completely understood and accpet all the possible adverse reactions/side effects.   Do not drive, operating heavy machinery, perform activities at heights, swimming or participation in water activities or provide baby sitting services if your were admitted for syncope or siezures until you have seen by Primary MD or a Neurologist and advised to do so again.  Do not drive when taking Pain medications.    Do not take more than prescribed Pain, Sleep and Anxiety Medications  Special Instructions: If you have smoked or chewed Tobacco  in the last 2 yrs please stop smoking, stop any regular Alcohol  and or any Recreational drug use.  Wear Seat belts while driving.   Please note  You were cared for by a hospitalist during your hospital stay. If you have any questions about your discharge medications or the care you received while you were in the  hospital after you are discharged, you can call the unit and asked to speak with the hospitalist on call if the hospitalist that took care of you is not available. Once you are discharged, your primary care physician will handle any further medical issues. Please note that NO REFILLS for any discharge medications will be authorized once you are discharged, as it is imperative that you return to your primary care physician (or establish a relationship with a primary care physician if you do not have one) for your aftercare needs so that they can reassess your need for medications and monitor your lab values.   Increase activity slowly   Complete by: As directed       Allergies as of 04/09/2022   No Known Allergies      Medication List     STOP taking these medications    acetaminophen 650 MG CR tablet Commonly known as: TYLENOL   lisinopril-hydrochlorothiazide 20-25 MG tablet Commonly known as: ZESTORETIC   potassium chloride SA 20 MEQ tablet Commonly known as: KLOR-CON M   pravastatin 80 MG tablet Commonly known as: PRAVACHOL       TAKE these medications    Fluticasone-Salmeterol 250-50 MCG/DOSE Aepb Commonly known as: ADVAIR Inhale 1 puff into the lungs 2 (two) times daily.   folic acid 1 MG tablet Commonly known as: FOLVITE Take 1 tablet (1 mg total) by  mouth daily. Start taking on: April 10, 2022   LORazepam 0.5 MG tablet Commonly known as: ATIVAN Take 0.5 mg by mouth See admin instructions. Take 0.5 mg by mouth two times a day for 4 days, 0.5 mg once a day for 4 days, then stop   One-A-Day Mens 50+ Tabs Take 1 tablet by mouth daily with breakfast.   pantoprazole 40 MG tablet Commonly known as: Protonix Take 1 tablet (40 mg total) by mouth 2 (two) times daily. After 2 months go back to once a day regimen What changed: when to take this   POTASSIUM PO Take 1 tablet by mouth in the morning.   tamsulosin 0.4 MG Caps capsule Commonly known as: FLOMAX Take 0.4 mg  by mouth daily after breakfast.   thiamine 100 MG tablet Take 1 tablet (100 mg total) by mouth daily. Start taking on: April 10, 2022   traZODone 100 MG tablet Commonly known as: DESYREL Take 100 mg by mouth at bedtime.   Vitamin D3 10 MCG (400 UNIT) Caps Take 800 Units by mouth in the morning.   Xarelto 20 MG Tabs tablet Generic drug: rivaroxaban Take 20 mg by mouth daily.        Follow-up Information     [facility] Follow up.   Specialty: Home Health Services Why: Home Health RN/PT/OT/aide/SW has been arragned. They will contact you within 1 to 2 days of discharge. Contact information: [place] [address] [phone]                No Known Allergies  Consultations: GI   Procedures/Studies: CT Abdomen Pelvis W Contrast  Result Date: 04/05/2022 CLINICAL DATA:  Acute abdominal pain. EXAM: CT ABDOMEN AND PELVIS WITH CONTRAST TECHNIQUE: Multidetector CT imaging of the abdomen and pelvis was performed using the standard protocol following bolus administration of intravenous contrast. RADIATION DOSE REDUCTION: This exam was performed according to the departmental dose-optimization program which includes automated exposure control, adjustment of the mA and/or kV according to patient size and/or use of iterative reconstruction technique. CONTRAST:  146m OMNIPAQUE IOHEXOL 300 MG/ML  SOLN COMPARISON:  CT virtual colonoscopy 02/07/2021. FINDINGS: Lower chest: There is stable atelectasis or scarring in the lung bases. Stable 3 mm nodule in the right lower lobe image 5/37. There is a calcified granuloma in the left lung base. Hepatobiliary: There is diffuse fatty infiltration of the liver. There is focal fat in the region of the gallbladder fossa, unchanged. Small gallstones are likely present. There is no biliary ductal dilatation. Pancreas: Unremarkable. No pancreatic ductal dilatation or surrounding inflammatory changes. Spleen: There  are calcified granulomas in the spleen. There is a new peripheral small wedge-shaped hypodensity in the spleen which may represent splenic infarct. This is new from prior. Adrenals/Urinary Tract: Rounded hypodensity in the right kidney is too small to characterize, likely a cyst. Otherwise, the kidneys, adrenal glands and bladder are within normal limits. Stomach/Bowel: Stomach is within normal limits. Appendix appears normal. No evidence of bowel wall thickening, distention, or inflammatory changes. There is diffuse colonic diverticulosis without evidence for acute diverticulitis. Vascular/Lymphatic: Aortic atherosclerosis. No enlarged abdominal or pelvic lymph nodes. Reproductive: Prostate is unremarkable. Other: Small fat containing left inguinal and umbilical hernias. No ascites. Musculoskeletal: Numerous sclerotic osseous lesions are again seen most significant at T12, similar to prior. There are degenerative changes of the spine. Chronic compression deformity of L1 is unchanged. IMPRESSION: 1. No acute process in the abdomen or pelvis. 2.  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 3. Colonic diverticulosis. 4. Stable sclerotic lesions throughout the osseous structures. 5. Fatty infiltration of the liver. 6.  Aortic Atherosclerosis (ICD10-I70.0). Electronically Signed   By: Ronney Asters M.D.   On: 04/05/2022 17:25      Subjective:  No significant events overnight, he denies any complaints, no nausea, no vomiting, he reports good appetite.  Discharge Exam: Vitals:   04/09/22 0739 04/09/22 0805  BP:  (!) 122/56  Pulse:  (!) 50  Resp:  (!) 25  Temp:  97.9 F (36.6 C)  SpO2: 92% 92%   Vitals:   04/09/22 0309 04/09/22 0400 04/09/22 0739 04/09/22 0805  BP: (!) 104/48 (!) 114/57  (!) 122/56  Pulse: 60 (!) 59  (!) 50  Resp: 20 19  (!) 25  Temp:  98.2 F (36.8 C)  97.9 F (36.6 C)  TempSrc:  Oral  Oral  SpO2: 97% 93% 92% 92%  Weight:      Height:        General: Pt is alert, awake, not in  acute distress,hard of hearing Cardiovascular: RRR, S1/S2 +, no rubs, no gallops Respiratory: CTA bilaterally, no wheezing, no rhonchi Abdominal: Soft, NT, ND, bowel sounds + Extremities: no edema, no cyanosis    The results of significant diagnostics from this hospitalization (including imaging, microbiology, ancillary and laboratory) are listed below for reference.     Microbiology: No results found for this or any previous visit (from the past 240 hour(s)).   Labs: BNP (last 3 results) No results for input(s): BNP in the last 8760 hours. Basic Metabolic Panel: Recent Labs  Lab 04/05/22 1943 04/06/22 0122 04/07/22 0129 04/08/22 0148 04/09/22 0051  NA 134* 133* 136 136 139  K 4.0 4.0 3.7 3.6 3.8  CL 102 104 104 107 111  CO2 21* '22 24 23 24  '$ GLUCOSE 114* 112* 111* 110* 113*  BUN 35* 31* '18 10 12  '$ CREATININE 1.42* 1.21 0.88 0.82 0.80  CALCIUM 8.3* 8.3* 8.5* 8.3* 8.6*  MG  --  1.8 1.7 1.8 1.7  PHOS  --  2.4* 3.0 3.4 3.1   Liver Function Tests: Recent Labs  Lab 04/05/22 1943 04/06/22 0122 04/07/22 0129 04/08/22 0148 04/09/22 0051  AST 4,675* 3,535* 747* 256* 130*  ALT 4,779* 4,496* 2,376* 1,449* 1,011*  ALKPHOS 78 89 119 125 122  BILITOT 1.6* 1.5* 1.3* 1.0 0.6  PROT 5.6* 5.8* 5.5* 5.4* 5.6*  ALBUMIN 3.0* 3.1* 2.7* 2.7* 2.7*   No results for input(s): LIPASE, AMYLASE in the last 168 hours. Recent Labs  Lab 04/05/22 1628  AMMONIA 33   CBC: Recent Labs  Lab 04/05/22 1523 04/06/22 0122 04/07/22 0129 04/08/22 0148 04/09/22 0051  WBC 5.7 5.6 3.6* 3.3* 4.2  NEUTROABS 4.7 5.0  --   --   --   HGB 14.2 13.4 12.5* 11.9* 11.7*  HCT 43.9 39.2 35.6* 34.8* 35.0*  MCV 101.4* 97.5 96.2 97.2 99.4  PLT PLATELET CLUMPS NOTED ON SMEAR, UNABLE TO ESTIMATE 69* 98* 133* 155   Cardiac Enzymes: No results for input(s): CKTOTAL, CKMB, CKMBINDEX, TROPONINI in the last 168 hours. BNP: Invalid input(s): POCBNP CBG: No results for input(s): GLUCAP in the last 168  hours. D-Dimer No results for input(s): DDIMER in the last 72 hours. Hgb A1c No results for input(s): HGBA1C in the last 72 hours. Lipid Profile No results for input(s): CHOL, HDL, LDLCALC, TRIG, CHOLHDL, LDLDIRECT in the last 72 hours. Thyroid function studies No results for  input(s): TSH, T4TOTAL, T3FREE, THYROIDAB in the last 72 hours.  Invalid input(s): FREET3 Anemia work up No results for input(s): VITAMINB12, FOLATE, FERRITIN, TIBC, IRON, RETICCTPCT in the last 72 hours. Urinalysis    Component Value Date/Time   COLORURINE YELLOW 04/05/2022 1937   APPEARANCEUR CLEAR 04/05/2022 1937   LABSPEC >1.046 (H) 04/05/2022 1937   PHURINE 5.0 04/05/2022 1937   GLUCOSEU NEGATIVE 04/05/2022 1937   HGBUR NEGATIVE 04/05/2022 1937   BILIRUBINUR NEGATIVE 04/05/2022 1937   KETONESUR 5 (A) 04/05/2022 1937   PROTEINUR NEGATIVE 04/05/2022 1937   NITRITE NEGATIVE 04/05/2022 1937   LEUKOCYTESUR NEGATIVE 04/05/2022 1937   Sepsis Labs Invalid input(s): PROCALCITONIN,  WBC,  LACTICIDVEN Microbiology No results found for this or any previous visit (from the past 240 hour(s)).   Time coordinating discharge: Over 30 minutes  SIGNED:   Phillips Climes, MD  [facility] 04/09/2022, 9:14 AM Pager   If 7PM-7AM, please contact night-coverage [email or web address] Password [email or web address]

## 2022-04-09 NOTE — Care Management Important Message (Signed)
Important Message  Patient Details  Name: Jason [name] MRN: [id number] Date of Birth: July 07, 1941   Medicare Important Message Given:  Other (see comment)     Hannah Beat 04/09/2022, 10:22 AM

## 2022-04-09 NOTE — Discharge Instructions (Signed)
Follow with Primary MD Koirala, Dibas, MD in 7 days   Get CBC, CMP, INR checked  by Primary MD next visit.    Activity: As tolerated with Full fall precautions use walker/cane & assistance as needed   Disposition Home    Diet: Heart Healthy   On your next visit with your primary care physician please Get Medicines reviewed and adjusted.   Please request your Prim.MD to go over all Hospital Tests and Procedure/Radiological results at the follow up, please get all Hospital records sent to your Prim MD by signing hospital release before you go home.   If you experience worsening of your admission symptoms, develop shortness of breath, life threatening emergency, suicidal or homicidal thoughts you must seek medical attention immediately by calling 911 or calling your MD immediately  if symptoms less severe.  You Must read complete instructions/literature along with all the possible adverse reactions/side effects for all the Medicines you take and that have been prescribed to you. Take any new Medicines after you have completely understood and accpet all the possible adverse reactions/side effects.   Do not drive, operating heavy machinery, perform activities at heights, swimming or participation in water activities or provide baby sitting services if your were admitted for syncope or siezures until you have seen by Primary MD or a Neurologist and advised to do so again.  Do not drive when taking Pain medications.    Do not take more than prescribed Pain, Sleep and Anxiety Medications  Special Instructions: If you have smoked or chewed Tobacco  in the last 2 yrs please stop smoking, stop any regular Alcohol  and or any Recreational drug use.  Wear Seat belts while driving.   Please note  You were cared for by a hospitalist during your hospital stay. If you have any questions about your discharge medications or the care you received while you were in the hospital after you are  discharged, you can call the unit and asked to speak with the hospitalist on call if the hospitalist that took care of you is not available. Once you are discharged, your primary care physician will handle any further medical issues. Please note that NO REFILLS for any discharge medications will be authorized once you are discharged, as it is imperative that you return to your primary care physician (or establish a relationship with a primary care physician if you do not have one) for your aftercare needs so that they can reassess your need for medications and monitor your lab values.

## 2022-04-09 NOTE — TOC Transition Note (Signed)
Transition of Care [facility]) - CM/SW Discharge Note   Patient Details  Name: Jason [name] MRN: [id number] Date of Birth: 29-Jun-1941  Transition of Care [facility]) CM/SW Contact:  Carles Collet, RN Phone Number: 04/09/2022, 11:11 AM   Clinical Narrative:   [facility] that patient will DC today. No other TOC needs identified.     Final next level of care: [place] Barriers to Discharge: No Barriers Identified   Patient Goals and CMS Choice Patient states their goals for this hospitalization and ongoing recovery are:: return home CMS Medicare.gov Compare Post Acute Care list provided to:: Patient Represenative (must comment) (daughter and son) Choice offered to / list presented to : Adult Children  Discharge Placement                       Discharge Plan and Services     Post Acute Care Choice: Home Health                    HH Arranged: RN, PT, OT, Social Work, Nurse's Aide [place] Agency: [facility] Date [facility] Agency Contacted: 04/09/22 Time [place]: 1110 Representative spoke with at [place]: [place] ([place]) Interventions     Readmission Risk Interventions    04/09/2022   11:10 AM  Readmission Risk Prevention Plan  Transportation Screening Complete  PCP or Specialist Appt within 5-7 Days Complete  Home Care Screening Complete  Medication Review (RN CM) Referral to Pharmacy

## 2022-04-11 DIAGNOSIS — I10 Essential (primary) hypertension: Secondary | ICD-10-CM | POA: Diagnosis not present

## 2022-04-11 DIAGNOSIS — D696 Thrombocytopenia, unspecified: Secondary | ICD-10-CM | POA: Diagnosis not present

## 2022-04-11 DIAGNOSIS — E7800 Pure hypercholesterolemia, unspecified: Secondary | ICD-10-CM | POA: Diagnosis not present

## 2022-04-11 DIAGNOSIS — J449 Chronic obstructive pulmonary disease, unspecified: Secondary | ICD-10-CM | POA: Diagnosis not present

## 2022-04-11 DIAGNOSIS — K7200 Acute and subacute hepatic failure without coma: Secondary | ICD-10-CM | POA: Diagnosis not present

## 2022-04-11 DIAGNOSIS — K5730 Diverticulosis of large intestine without perforation or abscess without bleeding: Secondary | ICD-10-CM | POA: Diagnosis not present

## 2022-04-11 DIAGNOSIS — B179 Acute viral hepatitis, unspecified: Secondary | ICD-10-CM | POA: Diagnosis not present

## 2022-04-11 DIAGNOSIS — K219 Gastro-esophageal reflux disease without esophagitis: Secondary | ICD-10-CM | POA: Diagnosis not present

## 2022-04-16 DIAGNOSIS — Z09 Encounter for follow-up examination after completed treatment for conditions other than malignant neoplasm: Secondary | ICD-10-CM | POA: Diagnosis not present

## 2022-04-16 DIAGNOSIS — J439 Emphysema, unspecified: Secondary | ICD-10-CM | POA: Diagnosis not present

## 2022-04-16 DIAGNOSIS — F1010 Alcohol abuse, uncomplicated: Secondary | ICD-10-CM | POA: Diagnosis not present

## 2022-04-16 DIAGNOSIS — E7800 Pure hypercholesterolemia, unspecified: Secondary | ICD-10-CM | POA: Diagnosis not present

## 2022-04-16 DIAGNOSIS — R945 Abnormal results of liver function studies: Secondary | ICD-10-CM | POA: Diagnosis not present

## 2022-04-16 DIAGNOSIS — I10 Essential (primary) hypertension: Secondary | ICD-10-CM | POA: Diagnosis not present

## 2022-05-17 DIAGNOSIS — K759 Inflammatory liver disease, unspecified: Secondary | ICD-10-CM | POA: Diagnosis not present

## 2022-05-17 DIAGNOSIS — D696 Thrombocytopenia, unspecified: Secondary | ICD-10-CM | POA: Diagnosis not present

## 2022-06-17 DIAGNOSIS — R972 Elevated prostate specific antigen [PSA]: Secondary | ICD-10-CM | POA: Diagnosis not present

## 2022-06-24 DIAGNOSIS — N401 Enlarged prostate with lower urinary tract symptoms: Secondary | ICD-10-CM | POA: Diagnosis not present

## 2022-06-24 DIAGNOSIS — R3912 Poor urinary stream: Secondary | ICD-10-CM | POA: Diagnosis not present

## 2022-06-24 DIAGNOSIS — R972 Elevated prostate specific antigen [PSA]: Secondary | ICD-10-CM | POA: Diagnosis not present

## 2022-07-05 DIAGNOSIS — D696 Thrombocytopenia, unspecified: Secondary | ICD-10-CM | POA: Diagnosis not present

## 2022-07-05 DIAGNOSIS — D6859 Other primary thrombophilia: Secondary | ICD-10-CM | POA: Diagnosis not present

## 2022-07-05 DIAGNOSIS — F419 Anxiety disorder, unspecified: Secondary | ICD-10-CM | POA: Diagnosis not present

## 2022-07-05 DIAGNOSIS — I10 Essential (primary) hypertension: Secondary | ICD-10-CM | POA: Diagnosis not present

## 2022-07-05 DIAGNOSIS — J439 Emphysema, unspecified: Secondary | ICD-10-CM | POA: Diagnosis not present

## 2022-08-13 IMAGING — CT CT ABD-PELV W/ CM
3 of 5 series · 11 of 46 positions shown, 16 images · IV contrast (APPLIED)
Comparison: CT virtual colonoscopy 02/07/2021.

CLINICAL DATA: Acute abdominal pain.

EXAM:
CT ABDOMEN AND PELVIS WITH CONTRAST
TECHNIQUE: Multidetector CT imaging of the abdomen and pelvis was performed
using the standard protocol following bolus administration of
intravenous contrast.

[Series 3: abdomen 5.0 · axial · 0.81mm/px · z∈[-681,-296]mm · 7 of 103 slices shown, 12 images]
[im 13/103  soft-tissue]
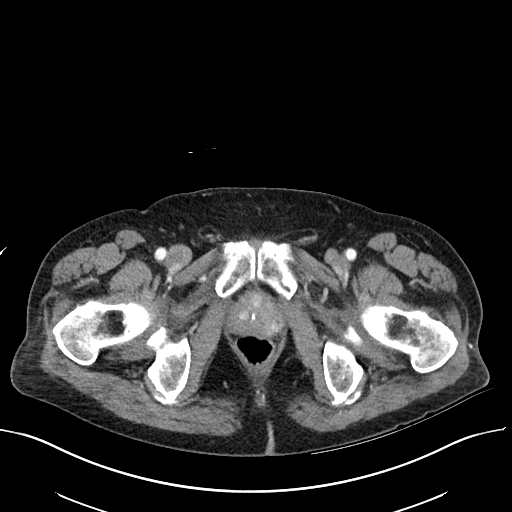
[im 13/103  bone]
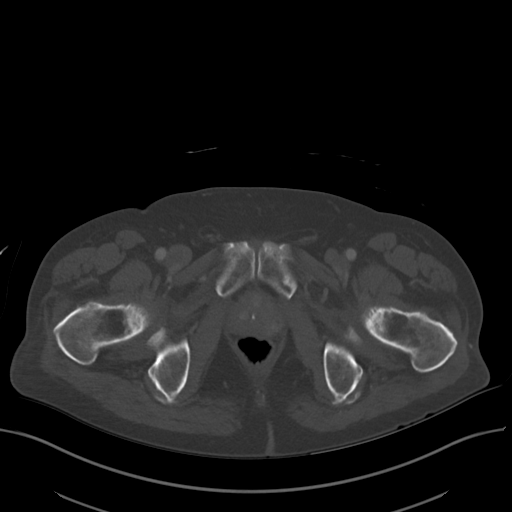
[im 26/103  soft-tissue]
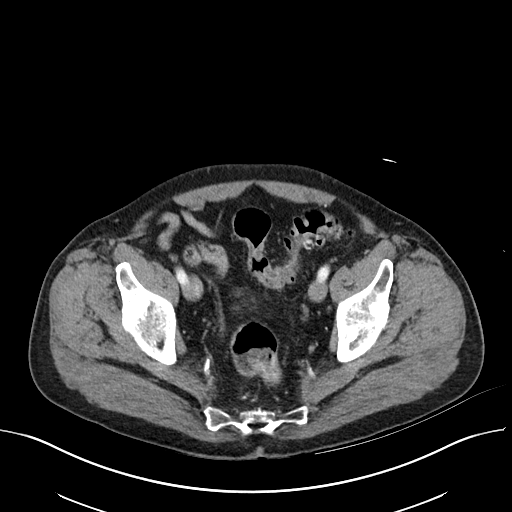
[im 39/103  soft-tissue]
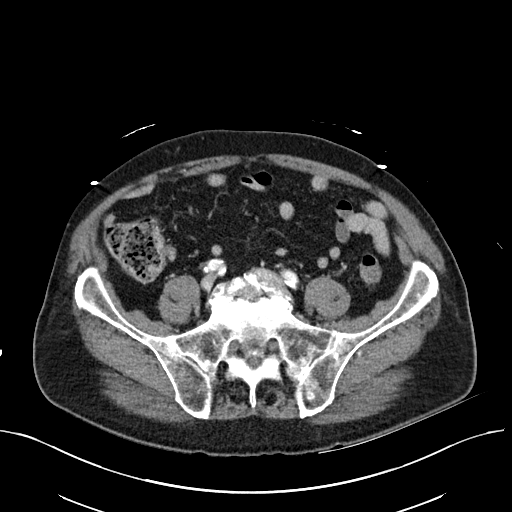
[im 52/103  soft-tissue]
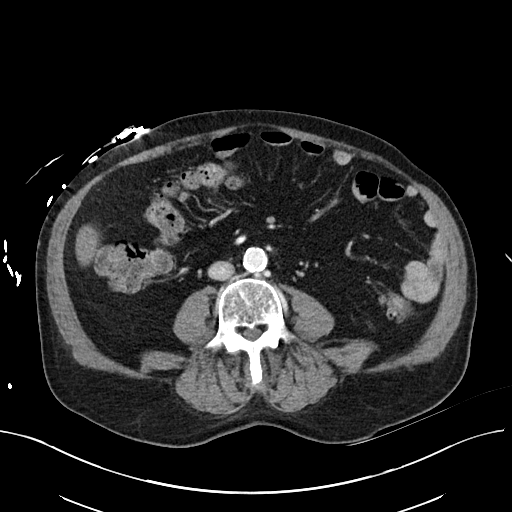
[im 52/103  lung]
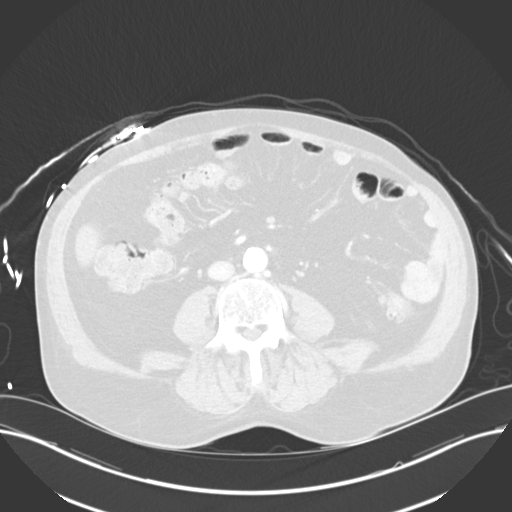
[im 64/103  soft-tissue]
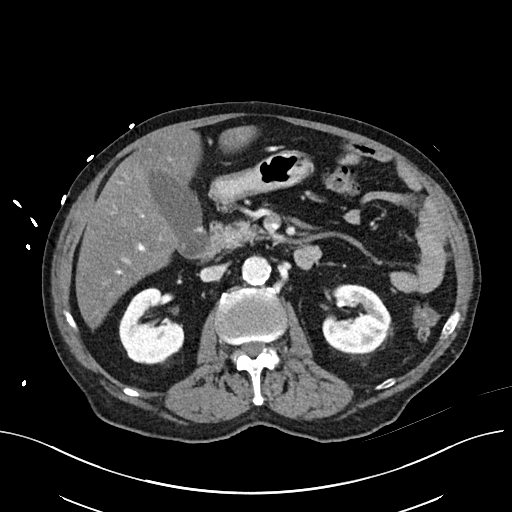
[im 64/103  lung]
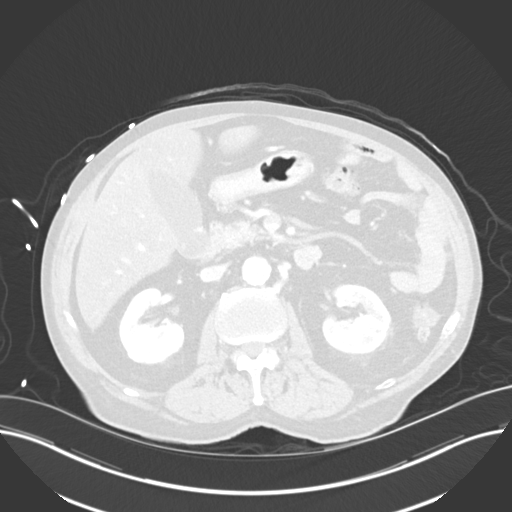
[im 77/103  soft-tissue]
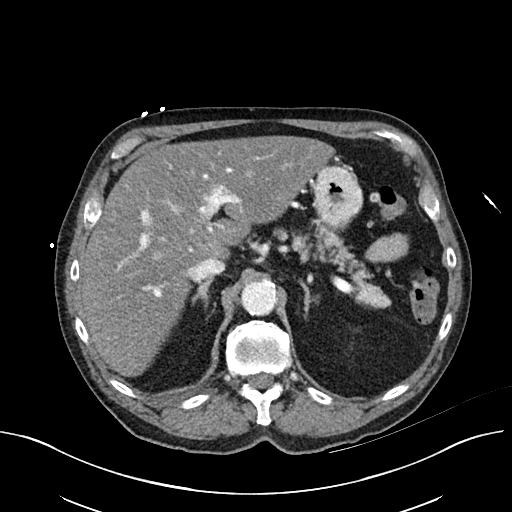
[im 77/103  lung]
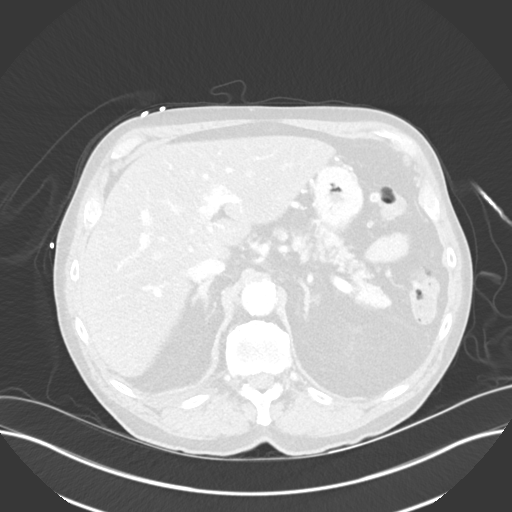
[im 90/103  soft-tissue]
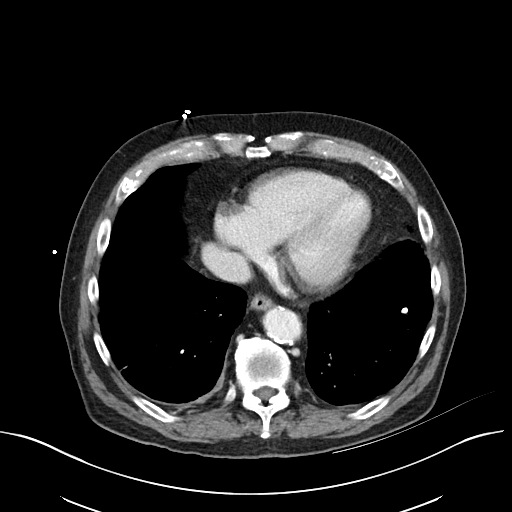
[im 90/103  lung]
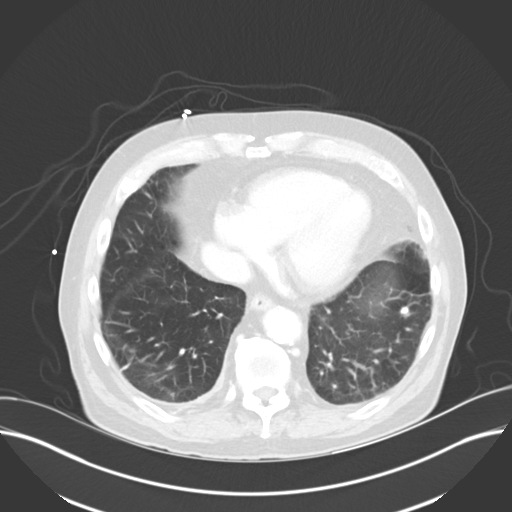

[Series 6: abdomen 3.0 mpr cor · coronal · 0.89mm/px · 3 of 99 slices shown]
[im 33/99  soft-tissue]
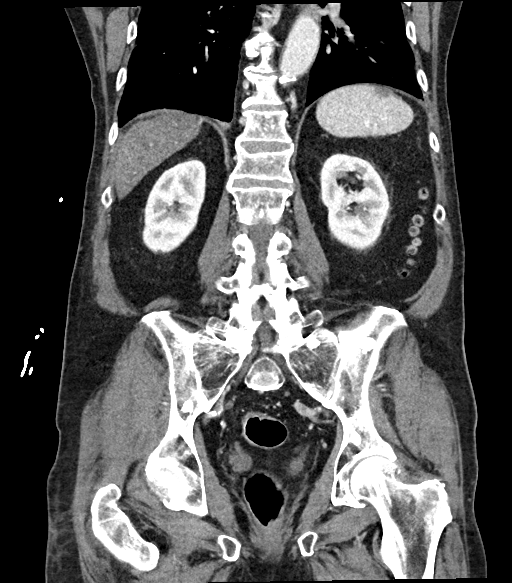
[im 44/99  soft-tissue]
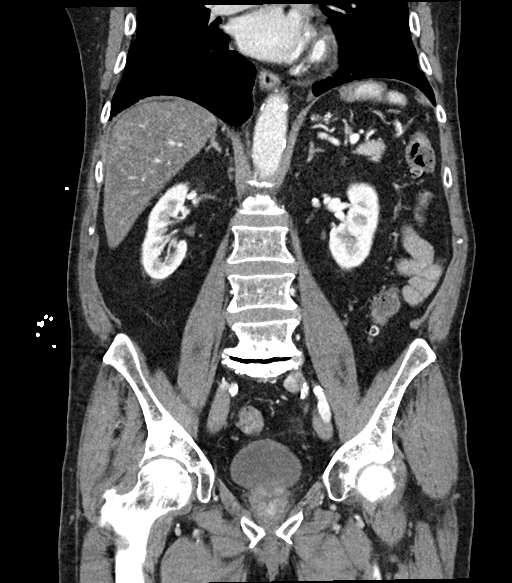
[im 55/99  soft-tissue]
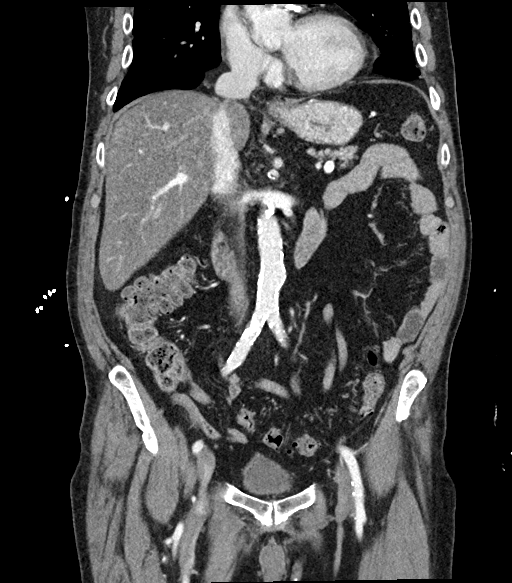

[Series 7: abdomen 3.0 mpr sag · sagittal · 0.58mm/px · 1 of 144 slices shown]
[im 48/144  soft-tissue]
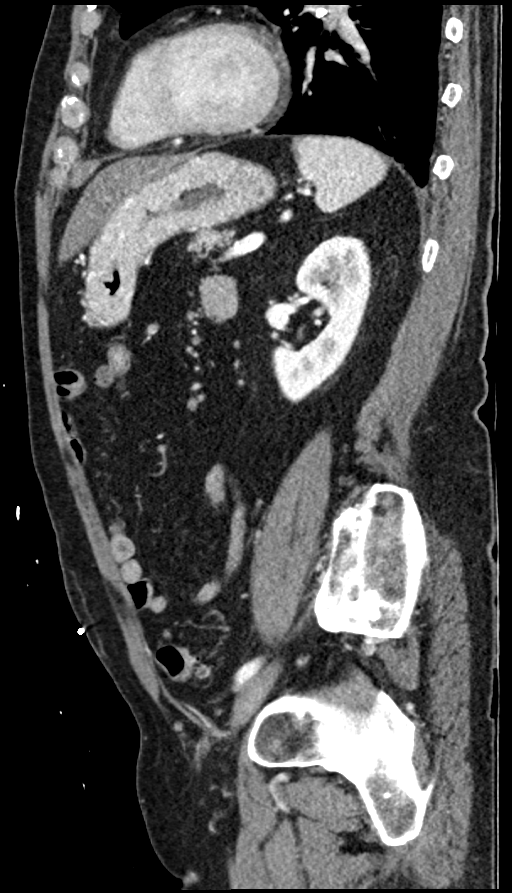

[11 of 46 positions shown; findings below may reference images not displayed]

RADIATION DOSE REDUCTION: This exam was performed according to the
departmental dose-optimization program which includes automated
exposure control, adjustment of the mA and/or kV according to
patient size and/or use of iterative reconstruction technique.

CONTRAST:  100mL OMNIPAQUE IOHEXOL 300 MG/ML  SOLN
FINDINGS: Lower chest: There is stable atelectasis or scarring in the lung
bases. Stable 3 mm nodule in the right lower lobe image 5/37. There
is a calcified granuloma in the left lung base.

Hepatobiliary: There is diffuse fatty infiltration of the liver.
There is focal fat in the region of the gallbladder fossa,
unchanged. Small gallstones are likely present. There is no biliary
ductal dilatation.

Pancreas: Unremarkable. No pancreatic ductal dilatation or
surrounding inflammatory changes.

Spleen: There are calcified granulomas in the spleen. There is a new
peripheral small wedge-shaped hypodensity in the spleen which may
represent splenic infarct. This is new from prior.

Adrenals/Urinary Tract: Rounded hypodensity in the right kidney is
too small to characterize, likely a cyst. Otherwise, the kidneys,
adrenal glands and bladder are within normal limits.

Stomach/Bowel: Stomach is within normal limits. Appendix appears
normal. No evidence of bowel wall thickening, distention, or
inflammatory changes. There is diffuse colonic diverticulosis
without evidence for acute diverticulitis.

Vascular/Lymphatic: Aortic atherosclerosis. No enlarged abdominal or
pelvic lymph nodes.

Reproductive: Prostate is unremarkable.

Other: Small fat containing left inguinal and umbilical hernias. No
ascites.

Musculoskeletal: Numerous sclerotic osseous lesions are again seen
most significant at T12, similar to prior. There are degenerative
changes of the spine. Chronic compression deformity of L1 is
unchanged.
IMPRESSION: 1. No acute process in the abdomen or pelvis.
2. Old splenic infarct is new from 1511.
3. Colonic diverticulosis.
4. Stable sclerotic lesions throughout the osseous structures.
5. Fatty infiltration of the liver.
6.  Aortic Atherosclerosis ([id number].U).

## 2022-08-26 DIAGNOSIS — Z23 Encounter for immunization: Secondary | ICD-10-CM | POA: Diagnosis not present

## 2022-08-26 DIAGNOSIS — I10 Essential (primary) hypertension: Secondary | ICD-10-CM | POA: Diagnosis not present

## 2022-08-26 DIAGNOSIS — M19049 Primary osteoarthritis, unspecified hand: Secondary | ICD-10-CM | POA: Diagnosis not present

## 2022-08-26 DIAGNOSIS — D6859 Other primary thrombophilia: Secondary | ICD-10-CM | POA: Diagnosis not present

## 2022-09-23 DIAGNOSIS — R972 Elevated prostate specific antigen [PSA]: Secondary | ICD-10-CM | POA: Diagnosis not present

## 2022-10-01 ENCOUNTER — Telehealth: Payer: Self-pay | Admitting: Internal Medicine

## 2022-10-01 ENCOUNTER — Other Ambulatory Visit (HOSPITAL_COMMUNITY): Payer: Self-pay | Admitting: Urology

## 2022-10-01 ENCOUNTER — Other Ambulatory Visit: Payer: Self-pay | Admitting: Urology

## 2022-10-01 DIAGNOSIS — R972 Elevated prostate specific antigen [PSA]: Secondary | ICD-10-CM

## 2022-10-01 NOTE — Telephone Encounter (Signed)
   Pre-operative Risk Assessment    Patient Name: Jason [name]  DOB: 1941-03-17 MRN: [id number]      Request for Surgical Clearance    Procedure:  A prostate Ultrasound and Biopsy  Date of Surgery:  11-08-22                                  Surgeon:  Dr Irine Seal Surgeon's Group or Practice Name:   Phone number:  [phone] Fax number:  [phone]   Type of Clearance Requested:   - Medical  - Pharmacy:  Hold Rivaroxaban (Xarelto)     Type of Anesthesia:  MAC   Additional requests/questions:    [name]   10/01/2022, 2:22 PM

## 2022-10-02 NOTE — Telephone Encounter (Signed)
   Name: Jason [name]  DOB: 12/10/1940  MRN: [id number]  Primary Cardiologist: None  Chart reviewed as part of pre-operative protocol coverage. Because of Rondall Kea's past medical history and time since last visit, he will require a follow-up in-office visit in order to better assess preoperative cardiovascular risk.  Pre-op covering staff: - Please schedule appointment and call patient to inform them. If patient already had an upcoming appointment within acceptable timeframe, please add "pre-op clearance" to the appointment notes so provider is aware. - Please contact requesting surgeon's office via preferred method (i.e, phone, fax) to inform them of need for appointment prior to surgery.  He is on Xarelto due to history of pulmonary embolism. Request to hold would need addressed by PCP. Will route to requesting party so they are aware.   Loel Dubonnet, NP  10/02/2022, 8:44 AM

## 2022-10-02 NOTE — Telephone Encounter (Signed)
S/w the pt's son and he has scheduled an appt for his dad (the pt) to see Dr. Debara Pickett 10/03/22 @ 3:30. I have given address for [facility] office location. Pt's son thanked me for the help and the call today.

## 2022-10-03 ENCOUNTER — Ambulatory Visit: Payer: Medicare HMO | Attending: Internal Medicine | Admitting: Internal Medicine

## 2022-10-03 VITALS — BP 112/58 | HR 59 | Ht 70.0 in | Wt 196.7 lb

## 2022-10-03 DIAGNOSIS — E7800 Pure hypercholesterolemia, unspecified: Secondary | ICD-10-CM | POA: Diagnosis not present

## 2022-10-03 DIAGNOSIS — G4700 Insomnia, unspecified: Secondary | ICD-10-CM | POA: Diagnosis not present

## 2022-10-03 DIAGNOSIS — R001 Bradycardia, unspecified: Secondary | ICD-10-CM

## 2022-10-03 DIAGNOSIS — N401 Enlarged prostate with lower urinary tract symptoms: Secondary | ICD-10-CM | POA: Diagnosis not present

## 2022-10-03 DIAGNOSIS — I10 Essential (primary) hypertension: Secondary | ICD-10-CM | POA: Diagnosis not present

## 2022-10-03 DIAGNOSIS — Z01810 Encounter for preprocedural cardiovascular examination: Secondary | ICD-10-CM | POA: Diagnosis not present

## 2022-10-03 DIAGNOSIS — J439 Emphysema, unspecified: Secondary | ICD-10-CM | POA: Diagnosis not present

## 2022-10-03 NOTE — Progress Notes (Signed)
OFFICE CONSULT NOTE  Chief Complaint:  Preoperative clearance  Primary Care Physician: Lujean Amel, MD  HPI:  Jason [name] is a 81 y.o. male who is being seen today for the evaluation of bradycardia and ectopic beats at the request of Koirala, Dibas, MD. This is a pleasant 81 year old male who is extremely hard of hearing, having a history of COPD, BPH, hyperlipidemia, hypertension, history of pulmonary embolism and irregular heartbeat in the past.  He was referred for ectopic beats noted on his EKG as well as bradycardia.  Jason [name] had noted that at 1 point his apple watch indicated heart rate was down in the 40s.  He remained asymptomatic for this.  In fact he denies any chest pain or shortness of breath.  He says he walks up to 4 miles a day without symptoms.  He also does a paddle boat fishing.  He denies any syncope or presyncope, dizziness or other symptoms associated with bradycardia.  He noted on his apple watch that his heart rate does increase with exercise.  10/03/2022  Jason [name] is seen today for preoperative clearance.  I saw him last in July 2022 for bradycardia and PVCs and PACs.  He has been asymptomatic with this.  He continues to have no shortness of breath or chest pain.  He says he walks at least 4 miles a day.  He is able to go up the stairs without chest pain.  EKG performed today again shows a sinus bradycardia at 59 but no ischemic changes.  PMHx:  Past Medical History:  Diagnosis Date   Anticoagulated    BPH (benign prostatic hyperplasia)    Chronic respiratory failure (HCC)    Colon polyps    COPD (chronic obstructive pulmonary disease) (HCC)    Ectopic beats    GERD (gastroesophageal reflux disease)    Hypercholesteremia    Hypercoagulable state ([place])    Hyperlipidemia    Hypertension    Insomnia    Irregular heart beat    Lesion of vertebra    Liver lesion    Pulmonary embolism ([place])    Pulmonary nodule     Past Surgical History:  Procedure  Laterality Date   ESOPHAGOGASTRODUODENOSCOPY (EGD) WITH PROPOFOL N/A 12/02/2020   Procedure: ESOPHAGOGASTRODUODENOSCOPY (EGD) WITH PROPOFOL;  Surgeon: Wilford Corner, MD;  Location: [facility];  Service: Endoscopy;  Laterality: N/A;   NO PAST SURGERIES      FAMHx:  Family History  Problem Relation Age of Onset   CVA Mother    Deep vein thrombosis Mother    CVA Father     SOCHx:   reports that he quit smoking about 12 years ago. His smoking use included cigarettes. He has a 90.00 pack-year smoking history. He has never used smokeless tobacco. He reports current alcohol use. He reports that he does not use drugs.  ALLERGIES:  No Known Allergies  ROS: Pertinent items noted in HPI and remainder of comprehensive ROS otherwise negative.  HOME MEDS: Current Outpatient Medications on File Prior to Visit  Medication Sig Dispense Refill   lisinopril-hydrochlorothiazide (ZESTORETIC) 20-25 MG tablet Take 1 tablet by mouth daily.     pravastatin (PRAVACHOL) 80 MG tablet Take 80 mg by mouth daily.     Cholecalciferol (VITAMIN D3) 10 MCG (400 UNIT) CAPS Take 800 Units by mouth in the morning.     Multiple Vitamins-Minerals (ONE-A-DAY MENS 50+) TABS Take 1 tablet by mouth daily with breakfast.     pantoprazole (PROTONIX) 40 MG  tablet Take 1 tablet (40 mg total) by mouth 2 (two) times daily. After 2 months go back to once a day regimen (Patient taking differently: Take 40 mg by mouth daily before breakfast. After 2 months go back to once a day regimen) 60 tablet 1   POTASSIUM PO Take 1 tablet by mouth in the morning.     tamsulosin (FLOMAX) 0.4 MG CAPS capsule Take 0.4 mg by mouth daily after breakfast.     traZODone (DESYREL) 100 MG tablet Take 100 mg by mouth at bedtime.     XARELTO 20 MG TABS tablet Take 20 mg by mouth daily.     No current facility-administered medications on file prior to visit.    LABS/IMAGING: No results found for this or any previous visit (from the past 48  hour(s)). No results found.  LIPID PANEL:    Component Value Date/Time   TRIG 48 06/21/2019 0300    WEIGHTS: Wt Readings from Last 3 Encounters:  10/03/22 196 lb 11.2 oz (89.2 kg)  04/06/22 167 lb 8.8 oz (76 kg)  05/22/21 166 lb 6.4 oz (75.5 kg)    VITALS: BP (!) 112/58   Pulse (!) 59   Ht '5\' 10"'$  (1.778 m)   Wt 196 lb 11.2 oz (89.2 kg)   SpO2 98%   BMI 28.22 kg/m   EXAM: General appearance: alert and no distress Neck: no carotid bruit, no JVD, and thyroid not enlarged, symmetric, no tenderness/mass/nodules Lungs: clear to auscultation bilaterally Heart: regular rate and rhythm Abdomen: soft, non-tender; bowel sounds normal; no masses,  no organomegaly Extremities: extremities normal, atraumatic, no cyanosis or edema Pulses: 2+ and symmetric Skin: Skin color, texture, turgor normal. No rashes or lesions Neurologic: Mental status: Alert, oriented, thought content appropriate, hard of hearing Psych: Pleasant  EKG: Sinus bradycardia at 59-personally reviewed  ASSESSMENT: Asymptomatic bradycardia PVCs and PACs COPD Hypertension Dyslipidemia  PLAN: 1.   Jason [name] continues to do well with asymptomatic bradycardia.  He can do more than 4 metabolic equivalents of activity.  He would be considered at acceptable risk for upcoming prostate biopsy for his rising PSA.  He should be also okay for any kind of treatments or surgery such as radical prostatectomy if that is necessary within the next year.  He would not need further preoperative evaluation.  I am happy to see him back on an as-needed basis.  Pixie Casino, MD, [facility], [place] Director of the Advanced Lipid Disorders &  Cardiovascular Risk Reduction Clinic Diplomate of the American Board of Clinical Lipidology Attending Cardiologist  Direct Dial: [phone]  Fax: [phone]  Website:  [email or web address] 10/03/2022, 3:55 PM

## 2022-10-03 NOTE — Patient Instructions (Signed)
Medication Instructions:  NO CHANGES  *If you need a refill on your cardiac medications before your next appointment, please call your pharmacy*    Follow-Up: At [facility], you and your health needs are our priority.  As part of our continuing mission to provide you with exceptional heart care, we have created designated Provider Care Teams.  These Care Teams include your primary Cardiologist (physician) and Advanced Practice Providers (APPs -  Physician Assistants and Nurse Practitioners) who all work together to provide you with the care you need, when you need it.  We recommend signing up for the patient portal called "MyChart".  Sign up information is provided on this After Visit Summary.  MyChart is used to connect with patients for Virtual Visits (Telemedicine).  Patients are able to view lab/test results, encounter notes, upcoming appointments, etc.  Non-urgent messages can be sent to your provider as well.   To learn more about what you can do with MyChart, go to [email or web address].    Your next appointment:    AS NEEDED with Dr. Hilty  

## 2022-10-04 NOTE — Addendum Note (Signed)
Addended by: Hinton Dyer on: 10/04/2022 02:27 PM   Modules accepted: Orders

## 2022-10-21 NOTE — Patient Instructions (Addendum)
DUE TO COVID-19 ONLY TWO VISITORS  (aged 82 and older)  ARE ALLOWED TO COME WITH YOU AND STAY IN THE WAITING ROOM ONLY DURING PRE OP AND PROCEDURE.   **NO VISITORS ARE ALLOWED IN THE SHORT STAY AREA OR RECOVERY ROOM!!**  IF YOU WILL BE ADMITTED INTO THE HOSPITAL YOU ARE ALLOWED ONLY FOUR SUPPORT PEOPLE DURING VISITATION HOURS ONLY (7 AM -8PM)   The support person(s) must pass our screening, gel in and out, and wear a mask at all times, including in the patient's room. Patients must also wear a mask when staff or their support person are in the room. Visitors GUEST BADGE MUST BE WORN VISIBLY  One adult visitor may remain with you overnight and MUST be in the room by 8 P.M.     Your procedure is scheduled on: 11/08/22   Report to [facility] Main Entrance    Report to admitting at 7:15 AM   Call this number if you have problems the morning of surgery [phone]   Do not eat food or drink:After Midnight.                If you have questions, please contact your surgeon's office.     Oral Hygiene is also important to reduce your risk of infection.                                    Remember - BRUSH YOUR TEETH THE MORNING OF SURGERY WITH YOUR REGULAR TOOTHPASTE  DENTURES WILL BE REMOVED PRIOR TO SURGERY PLEASE DO NOT APPLY "Poly grip" OR ADHESIVES!!!   Do NOT smoke after Midnight   Take these medicines the morning of surgery with A SIP OF WATER: Pravastatin-Pravachol                                                                                                                            Pantoprazole    Before surgery.Stop taking __Xarelto_________on __________as instructed by _____________.hold 72 hours  Stop taking ____________as directed by your Surgeon/Cardiologist.  Contact your Surgeon/Cardiologist for instructions on Anticoagulant Therapy prior to surgery.                               You may not have any metal on your body including jewelry, and body  piercing             Do not wear  lotions, powders, cologne, or deodorant               Men may shave face and neck.   Do not bring valuables to the hospital. [place].   Contacts, glasses, or bridgework may not be worn into surgery. .   DO NOT BRING  [place]. PHARMACY WILL DISPENSE MEDICATIONS LISTED ON YOUR MEDICATION LIST TO YOU DURING YOUR ADMISSION [facility]!    Patients discharged on the day of surgery will not be allowed to drive home.  Someone NEEDS to stay with you for the first 24 hours after anesthesia.   Special Instructions: Bring a copy of your healthcare power of attorney and living will documents    the day of surgery if you haven't scanned them before.              Please read over the following fact sheets you were given: IF YOU HAVE QUESTIONS ABOUT YOUR PRE-OP INSTRUCTIONS PLEASE CALL [phone]    [facility] - Preparing for Surgery Before surgery, you can play an important role.  Because skin is not sterile, your skin needs to be as free of germs as possible.  You can reduce the number of germs on your skin by washing with CHG (chlorahexidine gluconate) soap before surgery.  CHG is an antiseptic cleaner which kills germs and bonds with the skin to continue killing germs even after washing. Please DO NOT use if you have an allergy to CHG or antibacterial soaps.  If your skin becomes reddened/irritated stop using the CHG and inform your nurse when you arrive at Short Stay. Do not shave (including legs and underarms) for at least 48 hours prior to the first CHG shower.  You may shave your face/neck. Please follow these instructions carefully:  1.  Shower with CHG Soap the night before surgery and the  morning of Surgery.  2.  If you choose to wash your hair, wash your hair first as usual with your  normal  shampoo.  3.  After you shampoo, rinse your hair and body thoroughly to remove  the  shampoo.                            4.  Use CHG as you would any other liquid soap.  You can apply chg directly  to the skin and wash                       Gently with a scrungie or clean washcloth.  5.  Apply the CHG Soap to your body ONLY FROM THE NECK DOWN.   Do not use on face/ open                           Wound or open sores. Avoid contact with eyes, ears mouth and genitals (private parts).                       Wash face,  Genitals (private parts) with your normal soap.             6.  Wash thoroughly, paying special attention to the area where your surgery  will be performed.  7.  Thoroughly rinse your body with warm water from the neck down.  8.  DO NOT shower/wash with your normal soap after using and rinsing off  the CHG Soap.                9.  Pat yourself dry with a clean towel.            10.  Wear clean pajamas.  11.  Place clean sheets on your bed the night of your first shower and do not  sleep with pets. Day of Surgery : Do not apply any lotions/deodorants the morning of surgery.  Please wear clean clothes to the hospital/surgery center.  FAILURE TO FOLLOW THESE INSTRUCTIONS MAY RESULT IN THE CANCELLATION OF YOUR SURGERY   ________________________________________________________________________

## 2022-10-22 DIAGNOSIS — R8290 Unspecified abnormal findings in urine: Secondary | ICD-10-CM | POA: Diagnosis not present

## 2022-10-22 DIAGNOSIS — R972 Elevated prostate specific antigen [PSA]: Secondary | ICD-10-CM | POA: Diagnosis not present

## 2022-10-23 DIAGNOSIS — R29898 Other symptoms and signs involving the musculoskeletal system: Secondary | ICD-10-CM | POA: Diagnosis not present

## 2022-10-25 ENCOUNTER — Encounter (HOSPITAL_COMMUNITY)
Admission: RE | Admit: 2022-10-25 | Discharge: 2022-10-25 | Disposition: A | Discharge status: Home / Self Care | Payer: Medicare HMO | Source: Ambulatory Visit | Attending: Urology | Admitting: Urology

## 2022-10-25 ENCOUNTER — Other Ambulatory Visit: Payer: Self-pay

## 2022-10-25 ENCOUNTER — Encounter (HOSPITAL_COMMUNITY): Payer: Self-pay

## 2022-10-25 DIAGNOSIS — Z01812 Encounter for preprocedural laboratory examination: Secondary | ICD-10-CM | POA: Insufficient documentation

## 2022-10-25 DIAGNOSIS — I10 Essential (primary) hypertension: Secondary | ICD-10-CM | POA: Insufficient documentation

## 2022-10-25 LAB — BASIC METABOLIC PANEL
Anion gap: 10 (ref 5–15)
BUN: 15 mg/dL (ref 8–23)
CO2: 23 mmol/L (ref 22–32)
Calcium: 9.2 mg/dL (ref 8.9–10.3)
Chloride: 105 mmol/L (ref 98–111)
Creatinine, Ser: 0.91 mg/dL (ref 0.61–1.24)
GFR, Estimated: >60 mL/min (ref >60)
Glucose, Bld: 113 mg/dL — ABNORMAL HIGH (ref 70–99)
Potassium: 4.2 mmol/L (ref 3.5–5.1)
Sodium: 138 mmol/L (ref 135–145)

## 2022-10-25 LAB — CBC
HCT: 41.8 % (ref 39.0–52.0)
Hemoglobin: 13.6 g/dL (ref 13.0–17.0)
MCH: 28.9 pg (ref 26.0–34.0)
MCHC: 32.5 g/dL (ref 30.0–36.0)
MCV: 88.7 fL (ref 80.0–100.0)
Platelets: 354 10*3/uL (ref 150–400)
RBC: 4.71 MIL/uL (ref 4.22–5.81)
RDW: 15.6 % — ABNORMAL HIGH (ref 11.5–15.5)
WBC: 9 10*3/uL (ref 4.0–10.5)
nRBC: 0 % (ref 0.0–0.2)

## 2022-10-25 NOTE — Progress Notes (Signed)
Anesthesia note:  Bowel prep reminder:  NA  PCP - Dr. Keturah Barre. [place] Cardiologist -none Other-   Chest x-ray - no EKG - 10/04/22-epic Stress Test - no ECHO - 2020 Cardiac Cath - no CABG-no Pacemaker/ICD device last checked:NA  Sleep Study - no CPAP -   Pt is pre diabetic-NA CBG at PAT visit- Fasting Blood Sugar at home- Checks Blood Sugar _____  Blood Thinner:Xarelto for hx of PE Blood Thinner Instructions:hold 72 hours Aspirin Instructions: Last Dose:11/05/22  Anesthesia review: Yes  reason:  Patient denies shortness of breath, fever, cough and chest pain at PAT appointment. Pt is HOH with bi lat hearing aids he uses his phone to print out when people speak.   Patient verbalized understanding of instructions that were given to them at the PAT appointment. Patient was also instructed that they will need to review over the PAT instructions again at home before surgery.yes

## 2022-11-08 ENCOUNTER — Ambulatory Visit (HOSPITAL_COMMUNITY): Payer: Medicare HMO | Admitting: Physician Assistant

## 2022-11-08 ENCOUNTER — Ambulatory Visit (HOSPITAL_COMMUNITY)
Admission: RE | Admit: 2022-11-08 | Discharge: 2022-11-08 | Disposition: A | Discharge status: Home / Self Care | Payer: Medicare HMO | Attending: Urology | Admitting: Urology

## 2022-11-08 ENCOUNTER — Encounter (HOSPITAL_COMMUNITY): Payer: Self-pay | Admitting: Urology

## 2022-11-08 ENCOUNTER — Ambulatory Visit (HOSPITAL_BASED_OUTPATIENT_CLINIC_OR_DEPARTMENT_OTHER): Payer: Medicare HMO | Admitting: Anesthesiology

## 2022-11-08 ENCOUNTER — Ambulatory Visit (HOSPITAL_COMMUNITY)
Admission: RE | Admit: 2022-11-08 | Discharge: 2022-11-08 | Disposition: A | Discharge status: Home / Self Care | Payer: Medicare HMO | Source: Ambulatory Visit | Attending: Urology | Admitting: Urology

## 2022-11-08 ENCOUNTER — Encounter (HOSPITAL_COMMUNITY)
Admission: RE | Disposition: A | Discharge status: Home / Self Care | Payer: Self-pay | Source: Home / Self Care | Attending: Urology

## 2022-11-08 DIAGNOSIS — K219 Gastro-esophageal reflux disease without esophagitis: Secondary | ICD-10-CM | POA: Insufficient documentation

## 2022-11-08 DIAGNOSIS — J449 Chronic obstructive pulmonary disease, unspecified: Secondary | ICD-10-CM | POA: Insufficient documentation

## 2022-11-08 DIAGNOSIS — I10 Essential (primary) hypertension: Secondary | ICD-10-CM | POA: Insufficient documentation

## 2022-11-08 DIAGNOSIS — N4289 Other specified disorders of prostate: Secondary | ICD-10-CM | POA: Diagnosis not present

## 2022-11-08 DIAGNOSIS — R972 Elevated prostate specific antigen [PSA]: Secondary | ICD-10-CM

## 2022-11-08 DIAGNOSIS — K624 Stenosis of anus and rectum: Secondary | ICD-10-CM | POA: Diagnosis not present

## 2022-11-08 DIAGNOSIS — I2510 Atherosclerotic heart disease of native coronary artery without angina pectoris: Secondary | ICD-10-CM | POA: Diagnosis not present

## 2022-11-08 DIAGNOSIS — Z79899 Other long term (current) drug therapy: Secondary | ICD-10-CM | POA: Diagnosis not present

## 2022-11-08 DIAGNOSIS — Z87891 Personal history of nicotine dependence: Secondary | ICD-10-CM | POA: Insufficient documentation

## 2022-11-08 DIAGNOSIS — Z86711 Personal history of pulmonary embolism: Secondary | ICD-10-CM | POA: Insufficient documentation

## 2022-11-08 DIAGNOSIS — Z7901 Long term (current) use of anticoagulants: Secondary | ICD-10-CM | POA: Diagnosis not present

## 2022-11-08 DIAGNOSIS — C61 Malignant neoplasm of prostate: Secondary | ICD-10-CM | POA: Insufficient documentation

## 2022-11-08 HISTORY — PX: PROSTATE BIOPSY: SHX241

## 2022-11-08 SURGERY — BIOPSY, PROSTATE, RECTAL APPROACH, WITH US GUIDANCE
Anesthesia: Monitor Anesthesia Care

## 2022-11-08 MED ORDER — FENTANYL CITRATE (PF) 100 MCG/2ML IJ SOLN
INTRAMUSCULAR | Status: AC
  Filled 2022-11-08: qty 2

## 2022-11-08 MED ORDER — ORAL CARE MOUTH RINSE: 15.0000 mL | Freq: Once | OROMUCOSAL | Status: AC

## 2022-11-08 MED ORDER — FENTANYL CITRATE (PF) 100 MCG/2ML IJ SOLN
INTRAMUSCULAR | Status: DC | PRN
  Administered 2022-11-08 (×2): 25 ug via INTRAVENOUS

## 2022-11-08 MED ORDER — GENTAMICIN SULFATE 40 MG/ML IJ SOLN
5.0000 mg/kg | INTRAVENOUS | Status: AC
  Administered 2022-11-08: 420 mg via INTRAVENOUS
  Filled 2022-11-08: qty 10.5

## 2022-11-08 MED ORDER — PROPOFOL 10 MG/ML IV BOLUS
INTRAVENOUS | Status: DC | PRN
  Administered 2022-11-08: 20 mg via INTRAVENOUS
  Administered 2022-11-08: 50 mg via INTRAVENOUS
  Administered 2022-11-08: 30 mg via INTRAVENOUS

## 2022-11-08 MED ORDER — PROPOFOL 500 MG/50ML IV EMUL
INTRAVENOUS | Status: DC | PRN
  Administered 2022-11-08: 50 ug/kg/min via INTRAVENOUS

## 2022-11-08 MED ORDER — SODIUM CHLORIDE 0.9 % IV SOLN
2.0000 g | INTRAVENOUS | Status: AC
  Administered 2022-11-08: 2 g via INTRAVENOUS
  Filled 2022-11-08: qty 20

## 2022-11-08 MED ORDER — SODIUM CHLORIDE 0.9 % IV SOLN: 250.0000 mL | INTRAVENOUS | Status: DC | PRN

## 2022-11-08 MED ORDER — MORPHINE SULFATE (PF) 2 MG/ML IV SOLN: 2.0000 mg | INTRAVENOUS | Status: DC | PRN

## 2022-11-08 MED ORDER — LIDOCAINE HCL 2 % IJ SOLN
INTRAMUSCULAR | Status: DC | PRN
  Administered 2022-11-08: 5 mL

## 2022-11-08 MED ORDER — OXYCODONE HCL 5 MG PO TABS: 5.0000 mg | ORAL_TABLET | ORAL | Status: DC | PRN

## 2022-11-08 MED ORDER — SODIUM CHLORIDE 0.9% FLUSH: 3.0000 mL | INTRAVENOUS | Status: DC | PRN

## 2022-11-08 MED ORDER — ACETAMINOPHEN 650 MG RE SUPP
650.0000 mg | RECTAL | Status: DC | PRN
  Filled 2022-11-08: qty 1

## 2022-11-08 MED ORDER — ONDANSETRON HCL 4 MG/2ML IJ SOLN
INTRAMUSCULAR | Status: DC | PRN
  Administered 2022-11-08: 4 mg via INTRAVENOUS

## 2022-11-08 MED ORDER — SODIUM CHLORIDE 0.9% FLUSH: 3.0000 mL | Freq: Two times a day (BID) | INTRAVENOUS | Status: DC

## 2022-11-08 MED ORDER — PROPOFOL 10 MG/ML IV BOLUS
INTRAVENOUS | Status: AC
  Filled 2022-11-08: qty 20

## 2022-11-08 MED ORDER — LACTATED RINGERS IV SOLN: INTRAVENOUS | Status: DC

## 2022-11-08 MED ORDER — LIDOCAINE HCL 2 % IJ SOLN
INTRAMUSCULAR | Status: AC
  Filled 2022-11-08: qty 20

## 2022-11-08 MED ORDER — ACETAMINOPHEN 325 MG PO TABS: 650.0000 mg | ORAL_TABLET | ORAL | Status: DC | PRN

## 2022-11-08 MED ORDER — FENTANYL CITRATE PF 50 MCG/ML IJ SOSY: 25.0000 ug | PREFILLED_SYRINGE | INTRAMUSCULAR | Status: DC | PRN

## 2022-11-08 MED ORDER — ONDANSETRON HCL 4 MG/2ML IJ SOLN: 4.0000 mg | Freq: Once | INTRAMUSCULAR | Status: DC | PRN

## 2022-11-08 MED ORDER — DEXAMETHASONE SODIUM PHOSPHATE 10 MG/ML IJ SOLN
INTRAMUSCULAR | Status: DC | PRN
  Administered 2022-11-08: 10 mg via INTRAVENOUS

## 2022-11-08 MED ORDER — EPHEDRINE SULFATE-NACL 50-0.9 MG/10ML-% IV SOSY
PREFILLED_SYRINGE | INTRAVENOUS | Status: DC | PRN
  Administered 2022-11-08 (×2): 7.5 mg via INTRAVENOUS
  Administered 2022-11-08: 5 mg via INTRAVENOUS

## 2022-11-08 MED ORDER — ACETAMINOPHEN 500 MG PO TABS
1000.0000 mg | ORAL_TABLET | Freq: Once | ORAL | Status: AC
  Administered 2022-11-08: 1000 mg via ORAL
  Filled 2022-11-08: qty 2

## 2022-11-08 MED ORDER — CHLORHEXIDINE GLUCONATE 0.12 % MT SOLN
15.0000 mL | Freq: Once | OROMUCOSAL | Status: AC
  Administered 2022-11-08: 15 mL via OROMUCOSAL

## 2022-11-08 SURGICAL SUPPLY — 13 items
BAG COUNTER SPONGE SURGICOUNT (BAG) IMPLANT
DRSG TELFA 3X8 NADH STRL (GAUZE/BANDAGES/DRESSINGS) ×1 IMPLANT
GLOVE SURG SS PI 8.0 STRL IVOR (GLOVE) IMPLANT
INST BIOPSY MAXCORE 18GX25 (NEEDLE) IMPLANT
INSTR BIOPSY MAXCORE 18GX20 (NEEDLE) IMPLANT
NDL SAFETY ECLIP 18X1.5 (MISCELLANEOUS) IMPLANT
NDL SPNL 22GX7 QUINCKE BK (NEEDLE) IMPLANT
NEEDLE SPNL 22GX7 QUINCKE BK (NEEDLE) IMPLANT
PENCIL SMOKE EVACUATOR (MISCELLANEOUS) IMPLANT
SURGILUBE 2OZ TUBE FLIPTOP (MISCELLANEOUS) ×1 IMPLANT
SYR CONTROL 10ML LL (SYRINGE) IMPLANT
TOWEL OR 17X26 10 PK STRL BLUE (TOWEL DISPOSABLE) ×1 IMPLANT
UNDERPAD 30X36 HEAVY ABSORB (UNDERPADS AND DIAPERS) ×1 IMPLANT

## 2022-11-08 NOTE — H&P (Signed)
My PSA is elevated above the normal range.  HPI: Jason [name] is a 82 year-old [name] established patient who is here for an elevated PSA.  10/22/2022: Patient's PSA continues to elevate. It is now 13.1. At the recommendation of his urologist, the patient is scheduled to undergo formal prostate biopsy and prostate ultrasound under anesthesia given prior history of anal stenosis. Dr. Jeffie Pollock will also target previously noted PI-RADS 4 lesion from prior MRI at that time. Procedure is scheduled for 1/5. Here today for preoperative appointment. Overall doing well. His overall health remains stable. He continues tamsulosin with no new or worsening lower urinary tract symptomology since time of last assessment. He denies any recent dysuria, gross hematuria or interval treatment for UTI. He has had no recent chest pain, shortness of breath, lightheadedness or dizziness. He denies any recent fevers or chills, nausea/vomiting.   06/24/22: Jason [name] returns today in f/u for his history of an elevated PSA and abnormal MRI as noted below. He continues to void well on tamsulosin and his IPSS is 7. His UA is clear.   2/21/23[name] returns today in f/u for his history of an elevated PSA with a PIRADS 4 lesion on MRI. His PSA is 9.69 which is minimally decreased from the last. he continues to void well on tamsulosin with an IPSS of 3 with nocturia x 1.   09/14/21: Jason [name] returns today in f/u. He has a rising PSA and had an MRIP recently that demonstrated a 1cm PIRADS 4 lesion in the left posterior lateral prostatic apex. He would like to discuss the findings. His PSA was 9.73 with a 12% f/t ratio in 9/22 and that is up about 0.8 points since 3/22. His PSA was 4 in 2016. His prostate volume was 52m on MRI. He has anal stenosis that would require a biopsy be done in the OR. He has several comorbidities including COPD, Bradycardia and a history of a PE for which he takes Xarelto. He has mild LUTS with nocturia 1-2x and a prior IPSS of  6.   08/08/21: Jason [name] returns today in f/u. His PSA is up further to 9.73 with a 12% f/t ratio. He is voiding well on tamsulosin. He has an IPSS of 3 with nocturia x 1. He had to reschedule his MRIP and it is next week.   05/09/21:Jason Levelreturns today in f/u. His repeat PSA was 9.36 with a 13% f/t ratio. He never stopped the tamsulosin. His IPSS is 6. He has nocturia x 2.   02/05/21: Jason [name] who is sent by Dr. KDorthy Coolerfor BPH with LUTS and a PSA of 8.95 on 01/02/21. The previous [name] was 3.8 but I don't have a date on that. He is voiding with mild/mod LUTS with some frequency q2hrs and nocturia 2x. He has no dysuria or hematuria. He has no history of UTI's. He has no history of stones. He has had no GU surgery. He has a history of anemia with a GI bleed about 2 months ago. He was hospitalized then. He didn't have a foley. He is on tamsulosin but hasn't noticed much change in his urination. He has a history of PE's and is on Xarelto. He has had 2 episodes of PE.     AUA Symptom Score: He never has the sensation of not emptying his bladder completely after finishing urinating. Less than 50% of the time he has to urinate again fewer than two hours after he has finished urinating. He  does not have to stop and start again several times when he urinates. Less than 20% of the time he finds it difficult to postpone urination. Less than 50% of the time he has a weak urinary stream. Less than 20% of the time he has to push or strain to begin urination. He has to get up to urinate 1 time from the time he goes to bed until the time he gets up in the morning.   Calculated AUA Symptom Score: 7    ALLERGIES: No Allergies    MEDICATIONS: Lisinopril 20 mg tablet  Tamsulosin Hcl 0.4 mg capsule  Fluticasone-Salmeterol 250 mcg-50 mcg/dose blister, with inhalation device  Multi For Him  Pantoprazole Sodium 40 mg tablet, delayed release  Potassium Chloride  Pravastatin Sodium 80 mg tablet  Trazodone Hcl   Vitamin D2  Xarelto 20 mg tablet     GU PSH: Non-Newborn Circumcision Vasectomy     NON-GU PSH: No Non-GU PSH    GU PMH: BPH w/LUTS, He remains on tamsulosin with mild LUTS. - 06/24/2022, He is voiding well on tamsulosin. , - 12/25/2021, - 09/14/2021, He is doing well on tamsulosin., - 08/08/2021, He has mild/mod LUTS on tamsulosin and will continue that. , - 05/09/2021, He has mild/mod LUTS with no improvement on tamsulosin. I have recommended he stop it., - 2022 Elevated PSA, His PSA is up another 1.9 points. We discussed options including a biopsy under anesthesia vs continued observation. I will have him return for a PSA in 3 months and a PSA and OV in 6 months. He doesn't want to proceed with a biopsy at this time. I discussed how we might manage a prostate cancer if we find it including observation, XRT or ADT. - 06/24/2022, His PSA is minimally decreased from the prior [name] and up less than a point over the past year. I will just have him return in 6 months for an exam with a PSA. , - 12/25/2021, He has a rising PSA with a PSADT of >32yr but a PIRADS 4 lesion on MRI and a PSAD of about 2.2. He has anal stenosis that will require the OR for a prostate biopsy. I discussed the risks of a biopsy vs the risks of a prostate cancer and the potential treatment options. The lesion on MRI is small and contained and after reviewing the options, I will have him return in 32monthfor a PSA to get a better handle on the kinetics and if the rate of change has not hastened, I will follow the PSA [id number] consider another MRI in about a year. , - 09/14/2021, His PSA continues to rise. He will have the MRIP next week and I will call with results. If there are no worrisome findings, I will have him return in 6 months with a PSA. , - 08/08/2021, His PSA is up a bit more prior to this visit with a moderate risk f/t ratio. He remains on Xarelto and has anal stenosis that would require anesthesia to do a prostate biopsy. I  will get a prostate MRI to further assess risk and then have him return in 3 months for a PSA. His PSADT is about 5-6 years so it is fairly slow. , - 05/09/2021, His PSA is up from 3.8 to 8.95 and he thinks the last [name] was about 6 months ago. His exam is benign. I will have him return in 3 months with a repeat PSA since acute rises are typical from inflammation or  other non-malignant causes and to biopsy him would be difficult with the anal stenosis and anticoagulant needs. , - 2022 Weak Urinary Stream - 06/24/2022 Abnormal radiologic findings on diagnostic imaging of other urinary organs, I will consider a repeat MRI if there is a significant change in the PSA. - 12/25/2021, - 09/14/2021 Nocturia - 12/25/2021, - 09/14/2021, - 08/08/2021, - 05/09/2021, - 2022      PMH Notes: blood clot in lung, stomach ulcers   NON-GU PMH: Stenosis of anus and rectum - 09/14/2021, He has moderate anal stenosis but I was able to get far enough up for a prostate exam. He would need a biopsy, if indicated in the OR. , - 2022 Arthritis Hypercholesterolemia Hypertension    FAMILY HISTORY: 1 Daughter - Other 1 son - Other   SOCIAL HISTORY: Marital Status: Divorced Preferred Language: English; Ethnicity: Not Hispanic Or Latino; Race: White Current Smoking Status: Patient does not smoke anymore. Has not smoked since 02/02/2006. Smoked for 40 years. Smoked 1 pack per day.   Tobacco Use Assessment Completed: Used Tobacco in last 30 days? Does not use smokeless tobacco. Does not use drugs. Does not drink caffeine.    REVIEW OF SYSTEMS:    GU Review [name]:   Patient reports get up at night to urinate and frequent urination. Patient denies penile pain, hard to postpone urination, trouble starting your stream, leakage of urine, burning/ pain with urination, have to strain to urinate , erection problems, and stream starts and stops.  Gastrointestinal (Upper):   Patient denies nausea, vomiting, and indigestion/ heartburn.   Gastrointestinal (Lower):   Patient denies diarrhea and constipation.  Constitutional:   Patient denies fever, night sweats, weight loss, and fatigue.  Skin:   Patient denies skin rash/ lesion and itching.  Eyes:   Patient denies blurred vision and double vision.  Ears/ Nose/ Throat:   Patient denies sore throat and sinus problems.  Hematologic/Lymphatic:   Patient denies swollen glands and easy bruising.  Cardiovascular:   Patient denies leg swelling and chest pains.  Respiratory:   Patient denies cough and shortness of breath.  Endocrine:   Patient denies excessive thirst.  Musculoskeletal:   Patient denies back pain and joint pain.  Neurological:   Patient denies headaches and dizziness.  Psychologic:   Patient denies depression and anxiety.   Notes: Reviewed previous review of systems 06/24/2022. No changes.   VITAL SIGNS:      10/22/2022 10:38 AM  BP 151/100 mmHg  Pulse 72 /min  Temperature 97.3 F / 36.2 C   MULTI-SYSTEM PHYSICAL EXAMINATION:    Constitutional: Well-nourished. No physical deformities. Normally developed. Good grooming.   Neck: Neck symmetrical, not swollen. Normal tracheal position.  Respiratory: No labored breathing, no use of accessory muscles.   Cardiovascular: Normal temperature, normal extremity pulses, no swelling, no varicosities.  Skin: No paleness, no jaundice, no cyanosis. No lesion, no ulcer, no rash.  Neurologic / Psychiatric: Oriented to time, oriented to place, oriented to person. No depression, no anxiety, no agitation.  Gastrointestinal: No mass, no tenderness, no rigidity, non obese abdomen.  Ears, Nose, Mouth, and Throat: Hard of hearing.  Musculoskeletal: Normal gait and station of head and neck.     Complexity of Data:  Source Of History:  Patient, Medical Record Summary  Lab Test Review:   PSA  Records Review:   AUA Symptom Score, Previous Doctor Records, Previous Hospital Records, Previous Patient Records  Urine Test Review:    Urinalysis  X-Ray Review: MRI  Prostate GSORAD: Reviewed Report.     09/23/22 06/17/22 12/14/21 08/01/21 05/02/21 01/02/21 11/30/14 11/24/13  PSA  Total PSA 13.10 ng/mL 11.50 ng/mL 9.69 ng/mL 9.73 ng/mL 9.36 ng/mL 8.95 ng/ml 4.0 ng/ml 3.8 ng/ml  Free PSA   1.26 ng/mL 1.18 ng/mL 1.17 ng/mL     % Free PSA   13 % PSA 12 % PSA 13 % PSA       10/22/22  Urinalysis  Urine Appearance Clear   Urine Color Yellow   Urine Glucose Neg mg/dL  Urine Bilirubin Neg mg/dL  Urine Ketones Neg mg/dL  Urine Specific Gravity 1.025   Urine Blood Neg ery/uL  Urine pH 5.5   Urine Protein Neg mg/dL  Urine Urobilinogen 0.2 mg/dL  Urine Nitrites Neg   Urine Leukocyte Esterase Neg leu/uL   PROCEDURES:          Urinalysis Dipstick Dipstick Cont'd  Color: Yellow Bilirubin: Neg mg/dL  Appearance: Clear Ketones: Neg mg/dL  Specific Gravity: 1.025 Blood: Neg ery/uL  pH: 5.5 Protein: Neg mg/dL  Glucose: Neg mg/dL Urobilinogen: 0.2 mg/dL    Nitrites: Neg    Leukocyte Esterase: Neg leu/uL    ASSESSMENT:      ICD-10 Details  1 GU:   Elevated PSA - R97.20 Chronic, Worsening  2 NON-GU:   Encounter for other preprocedural examination - Z01.818 Undiagnosed New Problem   PLAN:           Orders Labs Urine Culture          Schedule Return Visit/Planned Activity: Keep Scheduled Appointment - Follow up MD, Schedule Surgery          Document Letter(s):  Created for Patient: Clinical Summary         Notes:   All questions answered to the best of my ability regarding the upcoming procedure and expected postoperative course with understanding expressed by the patient. Urine culture sent today to serve as a baseline. He will proceed with previously scheduled prostate ultrasound/biopsy under anesthesia with Dr. Jeffie Pollock on 11/08/2022.        Next Appointment:      Next Appointment: 11/08/2022 09:30 AM    Appointment Type: Surgery     Location: [facility] [phone]    Provider: Irine Seal,  M.D.    Reason for Visit: OP WL PROSTATE U/S AND BX

## 2022-11-08 NOTE — Interval H&P Note (Signed)
History and Physical Interval Note:  MRI films reviewed to establish location of lesion for the biopsy today.   11/08/2022 8:58 AM  Jason [name]  has presented today for surgery, with the diagnosis of [place].  The various methods of treatment have been discussed with the patient and family. After consideration of risks, benefits and other options for treatment, the patient has consented to  Procedure(s) with comments: BIOPSY TRANSRECTAL ULTRASONIC PROSTATE (TUBP) (N/A) - 45 MINS FOR CASE as a surgical intervention.  The patient's history has been reviewed, patient examined, no change in status, stable for surgery.  I have reviewed the patient's chart and labs.  Questions were answered to the patient's satisfaction.     Irine Seal

## 2022-11-08 NOTE — Anesthesia Postprocedure Evaluation (Signed)
Anesthesia Post Note  Patient: Jason [name]  Procedure(s) Performed: BIOPSY TRANSRECTAL ULTRASONIC PROSTATE (TUBP)     Patient location during evaluation: PACU Anesthesia Type: MAC Level of consciousness: awake and alert Pain management: pain level controlled Vital Signs Assessment: post-procedure vital signs reviewed and stable Respiratory status: spontaneous breathing, nonlabored ventilation, respiratory function stable and patient connected to nasal cannula oxygen Cardiovascular status: stable and blood pressure returned to baseline Postop Assessment: no apparent nausea or vomiting Anesthetic complications: no   No notable events documented.  Last Vitals:  Vitals:   11/08/22 1030 11/08/22 1045  BP: (!) 154/65 (!) 154/70  Pulse: 63 66  Resp: 18 20  Temp: 36.7 C 36.7 C  SpO2: 95% 95%    Last Pain:  Vitals:   11/08/22 1057  PainSc: 0-No pain                 Santa Lighter

## 2022-11-08 NOTE — Op Note (Signed)
Procedure: Transrectal prostate ultrasound with ultrasound-guided prostate biopsy.  Preop diagnosis: Elevated PSA with region of interest on MRI.  Postop diagnosis: Same.  Surgeon: Dr. Irine Seal.  Anesthesia: MAC and local.  Specimen: 1.  12 core standard template needle biopsies. 2.  Four additional needle biopsies and 2 bottles from the left apical posterior region of interest.  EBL: None.  Drain: None.  Complications: None.  Indications: The patient is an 82 year old white male with a history of an elevated PSA of over 13 and an MRI that showed a PI-RADS 4 lesion in the left posterior apical prostate.  He has anal stenosis so he required sedation for his biopsy.  Cognitive fusion will be used as result.  Procedure: He was taken the operating room where he was given gentamicin and Rocephin.  He was placed in the left lateral decubitus position and sedation was given as needed.  The 10 MHz transrectal ultrasound probe was assembled and inserted and scanning was performed.  He was noted to have normal seminal vesicles.  The prostate had well-defined transitional and peripheral zones.  There were some calcifications in the left posterior apex but no clear hypoechoic lesions were noted particularly in the area of the region of interest.  Prostate volume was 40.38 mL.  After completion of diagnostic scan 5 mL of 2% lidocaine was injected under ultrasound guidance in the area of the neurovascular bundle on each side near the base.  A 12 core needle biopsy was performed in the standard configuration.  After completion of standard biopsy, 4 additional cores were obtained from the area of the region of interest as determined by review of the MRI study with cognitive fusion with the ultrasound image and placed with 2 cores in each of 2 bottles.  The ultrasound was then removed and there was no significant bleeding noted.  He was returned to the supine position, his sedation was reversed and  he was moved to recovery room in stable condition.  There were no complications.

## 2022-11-08 NOTE — Transfer of Care (Signed)
Immediate Anesthesia Transfer of Care Note  Patient: Jason [name]  Procedure(s) Performed: Procedure(s) with comments: BIOPSY TRANSRECTAL ULTRASONIC PROSTATE (TUBP) (N/A) - 45 MINS FOR CASE  Patient Location: PACU  Anesthesia Type:MAC  Level of Consciousness: Patient easily awoken, sedated, comfortable, cooperative, following commands, responds to stimulation.   Airway & Oxygen Therapy: Patient spontaneously breathing, ventilating well, oxygen via simple oxygen mask.  Post-op Assessment: Report given to PACU RN, vital signs reviewed and stable, moving all extremities.   Post vital signs: Reviewed and stable.  Complications: No apparent anesthesia complications Last Vitals:  Vitals Value Taken Time  BP 105/43 11/08/22 1000  Temp 36.5 C 11/08/22 1000  Pulse 61 11/08/22 1001  Resp 26 11/08/22 1001  SpO2 100 % 11/08/22 1001  Vitals shown include unvalidated device data.  Last Pain:  Vitals:   11/08/22 0809  PainSc: 0-No pain         Complications: No notable events documented.

## 2022-11-08 NOTE — Anesthesia Procedure Notes (Signed)
Procedure Name: MAC Date/Time: 11/08/2022 9:30 AM  Performed by: Deliah Boston, CRNAPre-anesthesia Checklist: Patient identified, Emergency Drugs available, Suction available and Patient being monitored Patient Re-evaluated:Patient Re-evaluated prior to induction Oxygen Delivery Method: Simple face mask Preoxygenation: Pre-oxygenation with 100% oxygen Induction Type: IV induction Placement Confirmation: positive ETCO2 and breath sounds checked- equal and bilateral Dental Injury: Teeth and Oropharynx as per pre-operative assessment

## 2022-11-08 NOTE — H&P (Deleted)
Pt presents today for pre-operative history and physical exam in anticipation of radioactive seed and space oar placement by Dr. Jeffie Pollock on 11/08/22. He is doing well and is without complaint.   Pt denies F/C, HA, CP, SOB, N/V, diarrhea/constipation, back pain, flank pain, hematuria, and dysuria.    HX:     CC/HPI: I have prostate cancer.     09/30/22: Jason [name] returns today in f/u for his history of prostate cancer noted below. His PSA is down to 4.46 prior to this visit. He has no weight loss or bone pain. 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 His IPSS is 3. He is scheduled for a seed implant on 11/08/21.   06/23/22: [name] returns today in f/u from his recent MR fusion biopsy. He was found to have a 62m prostate with multiple cores involved with GG1 and GG2 cancer. 9 cores had GG1 in 1 - 90% and 3 cores had GG2 with one in each of the 2 ROI's and the RML core. He had a total of 18 cores. A staging PET scan was negative.   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   Shim: 2.   IPSS: 4.   [facility] nomogram: 32% OCD, 68% ECE, 10% LNI and 14% SVI. 74% 565yrnd 61 1088yrS, 96% 15y51yrS.   He is on warfarin for his history of a mechanical AVR.    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   MV repair, AV replacement mechanical and aortic root repair 09/17/2016.     NON-GU PSH: Cholecystectomy (open) Heart Surgery (Unspecified) Surgical Pathology, Gross And Microscopic Examination For Prostate Needle - 06/14/2022     GU PMH: Elevated PSA - 09/30/2022, - 06/14/2022, - 05/29/2022, He has a positive ExoDx and a  positive MRI. I discussed options for management and the risks of a biopsy including bleeding, infection and voiding difficulty. HE would like to proceed with the MR fusion biopsy . He will need to be cleared by Dr. SkaiMarlou Porchcome off of the warfarin. , - 05/06/2022, His PSA is up a bit more but only about a point in total over 4 years and the f/t ratio is favorable. I discussed options and will start by getting the ExoDx test which is a type of urine based "liquid biospy". He will otherwise return in 6 months. , - 03/20/2022, His PSA is up slighlty but he had COVID 6 weeks or so prior to the blood draw. I will repeat the PSA in 6 months. , - 09/10/2021, His PSA is down further. I will have him return in a year with a PSA. , - 2021, His PSA is down slighty but in his general range over the past 2 years. I will have him return in 6 months with a repeat PSA for an exam. , - 2021 (Stable), His PSA is back up in the upper level of his range. His exam remains benign. I will just have him return in 6 months with a repeat PSA. , - 2020 (Worsening), - 2020 (Improving), His PSA has continued to decline  and is now down to normal. I think the prior elevation was secondary to his surgical event. He just needs annual PSA screening with his PCP and f/u with me prn. , - 2018, Up to 4.17 from 2.59 last year. , - 2017 Prostate Cancer, His PSA is down slightly and he has no concerning symptoms. He is scheduled for a seed implant on 11/08/21. - 09/30/2022, He has a 26m prostate with a T1c N0 M0 GG2 favorable intermediate risk prostate cancer but he has high volume disease. He has mild LUTS and severe ED. I discussed AS, RALP, EXRT, Seeds, Cryo and HIFU with him. He is not interested in surveillance and was initially interested in surgical therapy but after our discussion would like to be considered for a seed implant. I will send him to Dr. MTammi Klippelbut feel he is likely to be an appropriate candidate for that option. I discussed the  risks, benefits and rational for Brachytherapy. I reviewed the details of the procedure and the postoperative recovery expectations. I reviewed the risks of bleeding, infection, urinary retention with need for catheter drainage, injury to adjacent structures, severe prolonged LUTS possibly requiring medical or surgical therapy, incontinence, strictures, erectile dysfunction, bowel and bladder radiation injury, thrombotic events, anesthetic complications, urethra-rectal fistulae with need for additional surgery for correction and possible colostomy, mortality and delay radiation effects with a risk of secondary malignancies, treatment failure with need for salvage therapy. I discussed the risks, benefits and rational for EXRT. I reviewed the need for placement of fiducial markers and the time commitment for therapy. I reviewed the risks including fatigue, skin changes, local alopecia, rectal irritation with bleeding or change in bowel habits, bladder irritation with LUT's, future bleeding and bladder contraction, incontinence, erectile dysfunction, strictures, urethra-rectal fistulae with need for additional surgery for correction and possible colostomy, and there risk of secondary malignancies or treatment failure requiring salvage therapy. I discussed the risks, benefits and rational for radical prostatectomy and reviewed the surgical approaches. I reviewed the risks of bleeding, infection, injury to the bowel, bladder, ureters, rectum and other pelvic structures, bowel obstruction, wound complications and hernias, barotrauma, need for conversion to open from robotic, nerve compression injury, urine Holte, anastomotic stricture, incontinence, erectile dysfunction, thrombotic events, anesthetic complications and mortality. I discussed the preoperative preparation and the expected hospital stay and postoperative recovery as well as the risks of recurrence and need for salvage therapy. I briefly discussed ADT and the  associated side effects as he may need to be considered for ST - ADT if he elects radiation therapy particularly if he is not a candidate for seed monotherapy. , - 07/03/2022 Abnormal radiologic findings on diagnostic imaging of other urinary organs - 05/06/2022 BPH w/LUTS - 03/20/2022, He has mild LUTS and a benign exam. , - 09/10/2021, His exam remains benign. He has mild LUTS with some intermittinency and nocturia. , - 2021 (Stable), - 2020 Nocturia - 03/20/2022, - 09/10/2021, - 2021, He has nocturia x 1 with minimal bother. , - 2020 BPH w/o LUTS - 2017      PMH Notes: laryngeal cancer   NON-GU PMH: Localized edema - 05/29/2022 Cardiac murmur, unspecified GERD    FAMILY HISTORY: Diabetes - Mother heart failure - Father Heart problem - Father multiple myeloma - Mother   SOCIAL HISTORY: Marital Status: Widowed Preferred Language: English; Race: White Current Smoking Status: Patient does not smoke anymore.  Does not use smokeless tobacco. Light Drinker.  Does not use drugs. Drinks 2 caffeinated drinks  per day. Has not had a blood transfusion. Patient's occupation is/was retired.     Notes: ETOH one beer every couple of months    REVIEW OF SYSTEMS:    GU Review Male:   Patient denies frequent urination, hard to postpone urination, burning/ pain with urination, get up at night to urinate, leakage of urine, stream starts and stops, trouble starting your stream, have to strain to urinate , erection problems, and penile pain.  Gastrointestinal (Upper):   Patient denies nausea, vomiting, and indigestion/ heartburn.  Gastrointestinal (Lower):   Patient denies diarrhea and constipation.  Constitutional:   Patient denies fever, night sweats, weight loss, and fatigue.  Skin:   Patient denies skin rash/ lesion and itching.  Eyes:   Patient denies blurred vision and double vision.  Ears/ Nose/ Throat:   Patient denies sore throat and sinus problems.  Hematologic/Lymphatic:   Patient denies swollen  glands and easy bruising.  Cardiovascular:   Patient denies leg swelling and chest pains.  Respiratory:   Patient denies cough and shortness of breath.  Endocrine:   Patient denies excessive thirst.  Musculoskeletal:   Patient denies back pain and joint pain.  Neurological:   Patient denies headaches and dizziness.  Psychologic:   Patient denies depression and anxiety.   VITAL SIGNS:      10/15/2022 12:59 PM  BP 107/65 mmHg  Pulse 69 /min   MULTI-SYSTEM PHYSICAL EXAMINATION:    Constitutional: Well-nourished. No physical deformities. Normally developed. Good grooming.  Neck: Neck symmetrical, not swollen. Normal tracheal position.  Respiratory: Normal breath sounds. No labored breathing, no use of accessory muscles.   Cardiovascular: Regular rate and rhythm. No murmur, no gallop. mechanical aortic valve click noted   Lymphatic: No enlargement of neck, axillae, groin.  Skin: No paleness, no jaundice, no cyanosis. No lesion, no ulcer, no rash.  Neurologic / Psychiatric: Oriented to time, oriented to place, oriented to person. No depression, no anxiety, no agitation.  Gastrointestinal: No mass, no tenderness, no rigidity, obese abdomen.   Eyes: Normal conjunctivae. Normal eyelids.  Ears, Nose, Mouth, and Throat: Left ear no scars, no lesions, no masses. Right ear no scars, no lesions, no masses. Nose no scars, no lesions, no masses. Normal hearing. Normal lips.  Musculoskeletal: Normal gait and station of head and neck.     Complexity of Data:  Records Review:   Previous Patient Records  Urine Test Review:   Urinalysis   10/15/22  Urinalysis  Urine Appearance Clear   Urine Specimen Patient Cath   Urine Color Yellow   Urine Glucose Neg   Urine Bilirubin Neg   Urine Ketones Neg   Urine Specific Gravity 1.020   Urine Blood Neg   Urine pH 5.5   Urine Protein Neg   Urine Urobilinogen 0.2   Urine Nitrites Neg   Urine Leukocyte Esterase Neg    PROCEDURES:          Urinalysis -  81003 Dipstick Dipstick Cont'd  Specimen: Patient Cath Bilirubin: Neg  Color: Yellow Ketones: Neg  Appearance: Clear Blood: Neg  Specific Gravity: 1.020 Protein: Neg  pH: 5.5 Urobilinogen: 0.2  Glucose: Neg Nitrites: Neg    Leukocyte Esterase: Neg    ASSESSMENT:      ICD-10 Details  1 GU:   Prostate Cancer - C61    PLAN:           Schedule Return Visit/Planned Activity: Keep Scheduled Appointment - Schedule Surgery  Document Letter(s):  Created for Patient: Clinical Summary         Notes:   There are no changes in the patients history or physical exam since last evaluation by Dr. Jeffie Pollock. Pt is scheduled to undergo seed and space oar placement on 11/08/22.   All pt's questions were answered to the best of my ability.          Next Appointment:      Next Appointment: 11/08/2022 07:30 AM    Appointment Type: Surgery     Location: [facility] [phone]    Provider: Irine Seal, M.D.    Reason for Visit: [place]

## 2022-11-08 NOTE — Discharge Instructions (Addendum)
You may resume the Xarelto in 48 hours if there is no bleeding.  You may use tylenol for pain.

## 2022-11-08 NOTE — Anesthesia Preprocedure Evaluation (Addendum)
Anesthesia Evaluation  Patient identified by MRN, date of birth, ID band Patient awake    Reviewed: Allergy & Precautions, NPO status , Patient's Chart, lab work & pertinent test results  Airway Mallampati: II  TM Distance: >3 FB Neck ROM: Full    Dental  (+) Dental Advisory Given, Poor Dentition, Missing, Edentulous Upper   Pulmonary COPD, former smoker, PE   Pulmonary exam normal breath sounds clear to auscultation       Cardiovascular hypertension, Pt. on medications + CAD  Normal cardiovascular exam Rhythm:Regular Rate:Normal     Neuro/Psych negative neurological ROS     GI/Hepatic ,GERD  Medicated,,(+)     substance abuse  alcohol use  Endo/Other  negative endocrine ROS    Renal/GU negative Renal ROS   ELEVATED PROSTATE SPECIFIC ANTIGEN    Musculoskeletal negative musculoskeletal ROS (+)    Abdominal   Peds  Hematology  (+) Blood dyscrasia (Xarelto)   Anesthesia Other Findings Day of surgery medications reviewed with the patient.  Reproductive/Obstetrics                             Anesthesia Physical Anesthesia Plan  ASA: 3  Anesthesia Plan: MAC   Post-op Pain Management: Tylenol PO (pre-op)*   Induction: Intravenous  PONV Risk Score and Plan: 3 and Dexamethasone, Ondansetron and TIVA  Airway Management Planned: LMA  Additional Equipment:   Intra-op Plan:   Post-operative Plan:   Informed Consent: I have reviewed the patients History and Physical, chart, labs and discussed the procedure including the risks, benefits and alternatives for the proposed anesthesia with the patient or authorized representative who has indicated his/her understanding and acceptance.     Dental advisory given  Plan Discussed with: CRNA  Anesthesia Plan Comments:         Anesthesia Quick Evaluation

## 2022-11-09 ENCOUNTER — Encounter (HOSPITAL_COMMUNITY): Payer: Self-pay | Admitting: Urology

## 2022-11-11 LAB — SURGICAL PATHOLOGY

## 2022-11-15 DIAGNOSIS — N401 Enlarged prostate with lower urinary tract symptoms: Secondary | ICD-10-CM | POA: Diagnosis not present

## 2022-11-15 DIAGNOSIS — C61 Malignant neoplasm of prostate: Secondary | ICD-10-CM | POA: Diagnosis not present

## 2022-11-15 DIAGNOSIS — R3912 Poor urinary stream: Secondary | ICD-10-CM | POA: Diagnosis not present

## 2022-12-09 ENCOUNTER — Other Ambulatory Visit (HOSPITAL_COMMUNITY): Payer: Self-pay | Admitting: Urology

## 2022-12-09 DIAGNOSIS — C61 Malignant neoplasm of prostate: Secondary | ICD-10-CM

## 2022-12-20 ENCOUNTER — Ambulatory Visit (HOSPITAL_COMMUNITY): Admission: RE | Admit: 2022-12-20 | Payer: Medicare HMO | Source: Ambulatory Visit

## 2022-12-20 ENCOUNTER — Encounter (HOSPITAL_COMMUNITY): Payer: Self-pay

## 2022-12-23 DIAGNOSIS — I10 Essential (primary) hypertension: Secondary | ICD-10-CM | POA: Diagnosis not present

## 2022-12-23 DIAGNOSIS — E785 Hyperlipidemia, unspecified: Secondary | ICD-10-CM | POA: Diagnosis not present

## 2022-12-23 DIAGNOSIS — J439 Emphysema, unspecified: Secondary | ICD-10-CM | POA: Diagnosis not present

## 2022-12-23 DIAGNOSIS — G4700 Insomnia, unspecified: Secondary | ICD-10-CM | POA: Diagnosis not present

## 2022-12-23 DIAGNOSIS — N401 Enlarged prostate with lower urinary tract symptoms: Secondary | ICD-10-CM | POA: Diagnosis not present

## 2023-01-03 ENCOUNTER — Ambulatory Visit (HOSPITAL_COMMUNITY)
Admission: RE | Admit: 2023-01-03 | Discharge: 2023-01-03 | Disposition: A | Discharge status: Home / Self Care | Payer: Medicare HMO | Source: Ambulatory Visit | Attending: Urology | Admitting: Urology

## 2023-01-03 DIAGNOSIS — C61 Malignant neoplasm of prostate: Secondary | ICD-10-CM | POA: Diagnosis not present

## 2023-01-03 MED ORDER — PIFLIFOLASTAT F 18 (PYLARIFY) INJECTION
9.0000 | Freq: Once | INTRAVENOUS | Status: AC
  Administered 2023-01-03: 9.3 via INTRAVENOUS

## 2023-01-07 DIAGNOSIS — C61 Malignant neoplasm of prostate: Secondary | ICD-10-CM | POA: Diagnosis not present

## 2023-01-07 DIAGNOSIS — D696 Thrombocytopenia, unspecified: Secondary | ICD-10-CM | POA: Diagnosis not present

## 2023-01-07 DIAGNOSIS — Z79899 Other long term (current) drug therapy: Secondary | ICD-10-CM | POA: Diagnosis not present

## 2023-01-07 DIAGNOSIS — F1021 Alcohol dependence, in remission: Secondary | ICD-10-CM | POA: Diagnosis not present

## 2023-01-07 DIAGNOSIS — E7800 Pure hypercholesterolemia, unspecified: Secondary | ICD-10-CM | POA: Diagnosis not present

## 2023-01-07 DIAGNOSIS — Z0001 Encounter for general adult medical examination with abnormal findings: Secondary | ICD-10-CM | POA: Diagnosis not present

## 2023-01-07 DIAGNOSIS — J439 Emphysema, unspecified: Secondary | ICD-10-CM | POA: Diagnosis not present

## 2023-01-07 DIAGNOSIS — D6859 Other primary thrombophilia: Secondary | ICD-10-CM | POA: Diagnosis not present

## 2023-02-05 DIAGNOSIS — R3915 Urgency of urination: Secondary | ICD-10-CM | POA: Diagnosis not present

## 2023-02-05 DIAGNOSIS — N401 Enlarged prostate with lower urinary tract symptoms: Secondary | ICD-10-CM | POA: Diagnosis not present

## 2023-02-05 DIAGNOSIS — C61 Malignant neoplasm of prostate: Secondary | ICD-10-CM | POA: Diagnosis not present

## 2023-03-13 DIAGNOSIS — R070 Pain in throat: Secondary | ICD-10-CM | POA: Diagnosis not present

## 2023-03-13 DIAGNOSIS — J40 Bronchitis, not specified as acute or chronic: Secondary | ICD-10-CM | POA: Diagnosis not present

## 2023-03-25 DIAGNOSIS — B370 Candidal stomatitis: Secondary | ICD-10-CM | POA: Diagnosis not present

## 2023-03-25 DIAGNOSIS — R0602 Shortness of breath: Secondary | ICD-10-CM | POA: Diagnosis not present

## 2023-03-26 ENCOUNTER — Other Ambulatory Visit: Payer: Self-pay | Admitting: Family Medicine

## 2023-03-26 ENCOUNTER — Ambulatory Visit
Admission: RE | Admit: 2023-03-26 | Discharge: 2023-03-26 | Disposition: A | Discharge status: Home / Self Care | Payer: Medicare HMO | Source: Ambulatory Visit | Attending: Family Medicine | Admitting: Family Medicine

## 2023-03-26 DIAGNOSIS — R059 Cough, unspecified: Secondary | ICD-10-CM | POA: Diagnosis not present

## 2023-03-26 DIAGNOSIS — R0981 Nasal congestion: Secondary | ICD-10-CM | POA: Diagnosis not present

## 2023-03-26 DIAGNOSIS — R0602 Shortness of breath: Secondary | ICD-10-CM

## 2023-04-28 DIAGNOSIS — C61 Malignant neoplasm of prostate: Secondary | ICD-10-CM | POA: Diagnosis not present

## 2023-05-05 DIAGNOSIS — N401 Enlarged prostate with lower urinary tract symptoms: Secondary | ICD-10-CM | POA: Diagnosis not present

## 2023-05-05 DIAGNOSIS — C61 Malignant neoplasm of prostate: Secondary | ICD-10-CM | POA: Diagnosis not present

## 2023-05-05 DIAGNOSIS — R351 Nocturia: Secondary | ICD-10-CM | POA: Diagnosis not present

## 2023-05-07 DIAGNOSIS — B370 Candidal stomatitis: Secondary | ICD-10-CM | POA: Diagnosis not present

## 2023-05-07 DIAGNOSIS — D649 Anemia, unspecified: Secondary | ICD-10-CM | POA: Diagnosis not present

## 2023-05-07 DIAGNOSIS — F1021 Alcohol dependence, in remission: Secondary | ICD-10-CM | POA: Diagnosis not present

## 2023-05-07 DIAGNOSIS — C61 Malignant neoplasm of prostate: Secondary | ICD-10-CM | POA: Diagnosis not present

## 2023-05-07 DIAGNOSIS — R0981 Nasal congestion: Secondary | ICD-10-CM | POA: Diagnosis not present

## 2023-05-07 DIAGNOSIS — J439 Emphysema, unspecified: Secondary | ICD-10-CM | POA: Diagnosis not present

## 2023-05-30 DIAGNOSIS — B370 Candidal stomatitis: Secondary | ICD-10-CM | POA: Diagnosis not present

## 2023-06-12 DIAGNOSIS — J439 Emphysema, unspecified: Secondary | ICD-10-CM | POA: Diagnosis not present

## 2023-06-12 DIAGNOSIS — B370 Candidal stomatitis: Secondary | ICD-10-CM | POA: Diagnosis not present

## 2023-09-22 DIAGNOSIS — J439 Emphysema, unspecified: Secondary | ICD-10-CM | POA: Diagnosis not present

## 2023-09-22 DIAGNOSIS — Z23 Encounter for immunization: Secondary | ICD-10-CM | POA: Diagnosis not present

## 2023-09-22 DIAGNOSIS — F1021 Alcohol dependence, in remission: Secondary | ICD-10-CM | POA: Diagnosis not present

## 2023-11-24 DIAGNOSIS — C61 Malignant neoplasm of prostate: Secondary | ICD-10-CM | POA: Diagnosis not present

## 2023-12-01 DIAGNOSIS — N401 Enlarged prostate with lower urinary tract symptoms: Secondary | ICD-10-CM | POA: Diagnosis not present

## 2023-12-01 DIAGNOSIS — C61 Malignant neoplasm of prostate: Secondary | ICD-10-CM | POA: Diagnosis not present

## 2023-12-01 DIAGNOSIS — R3915 Urgency of urination: Secondary | ICD-10-CM | POA: Diagnosis not present

## 2023-12-01 DIAGNOSIS — R351 Nocturia: Secondary | ICD-10-CM | POA: Diagnosis not present

## 2023-12-11 DIAGNOSIS — Z79899 Other long term (current) drug therapy: Secondary | ICD-10-CM | POA: Diagnosis not present

## 2023-12-11 DIAGNOSIS — J439 Emphysema, unspecified: Secondary | ICD-10-CM | POA: Diagnosis not present

## 2023-12-11 DIAGNOSIS — D649 Anemia, unspecified: Secondary | ICD-10-CM | POA: Diagnosis not present

## 2023-12-11 DIAGNOSIS — Z0001 Encounter for general adult medical examination with abnormal findings: Secondary | ICD-10-CM | POA: Diagnosis not present

## 2023-12-11 DIAGNOSIS — F1021 Alcohol dependence, in remission: Secondary | ICD-10-CM | POA: Diagnosis not present

## 2023-12-11 DIAGNOSIS — D6859 Other primary thrombophilia: Secondary | ICD-10-CM | POA: Diagnosis not present

## 2023-12-11 DIAGNOSIS — E7800 Pure hypercholesterolemia, unspecified: Secondary | ICD-10-CM | POA: Diagnosis not present

## 2024-01-02 DIAGNOSIS — R972 Elevated prostate specific antigen [PSA]: Secondary | ICD-10-CM | POA: Diagnosis not present

## 2024-01-15 ENCOUNTER — Other Ambulatory Visit (HOSPITAL_COMMUNITY): Payer: Self-pay | Admitting: Urology

## 2024-01-15 DIAGNOSIS — C61 Malignant neoplasm of prostate: Secondary | ICD-10-CM

## 2024-01-15 DIAGNOSIS — R972 Elevated prostate specific antigen [PSA]: Secondary | ICD-10-CM

## 2024-01-22 ENCOUNTER — Encounter (HOSPITAL_COMMUNITY)
Admission: RE | Admit: 2024-01-22 | Discharge: 2024-01-22 | Disposition: A | Discharge status: Home / Self Care | Source: Ambulatory Visit | Attending: Urology | Admitting: Urology

## 2024-01-22 DIAGNOSIS — R972 Elevated prostate specific antigen [PSA]: Secondary | ICD-10-CM | POA: Diagnosis not present

## 2024-01-22 DIAGNOSIS — C61 Malignant neoplasm of prostate: Secondary | ICD-10-CM | POA: Diagnosis not present

## 2024-01-22 DIAGNOSIS — R918 Other nonspecific abnormal finding of lung field: Secondary | ICD-10-CM | POA: Diagnosis not present

## 2024-01-22 MED ORDER — FLOTUFOLASTAT F 18 GALLIUM 296-5846 MBQ/ML IV SOLN
8.5600 | Freq: Once | INTRAVENOUS | Status: AC
  Administered 2024-01-22: 8.56 via INTRAVENOUS

## 2024-04-03 DIAGNOSIS — E7800 Pure hypercholesterolemia, unspecified: Secondary | ICD-10-CM | POA: Diagnosis not present

## 2024-04-03 DIAGNOSIS — J439 Emphysema, unspecified: Secondary | ICD-10-CM | POA: Diagnosis not present

## 2024-04-03 DIAGNOSIS — J40 Bronchitis, not specified as acute or chronic: Secondary | ICD-10-CM | POA: Diagnosis not present

## 2024-04-03 DIAGNOSIS — C61 Malignant neoplasm of prostate: Secondary | ICD-10-CM | POA: Diagnosis not present

## 2024-04-06 DIAGNOSIS — J439 Emphysema, unspecified: Secondary | ICD-10-CM | POA: Diagnosis not present

## 2024-04-06 DIAGNOSIS — J40 Bronchitis, not specified as acute or chronic: Secondary | ICD-10-CM | POA: Diagnosis not present

## 2024-04-19 DIAGNOSIS — R972 Elevated prostate specific antigen [PSA]: Secondary | ICD-10-CM | POA: Diagnosis not present

## 2024-04-26 DIAGNOSIS — C61 Malignant neoplasm of prostate: Secondary | ICD-10-CM | POA: Diagnosis not present

## 2024-04-26 DIAGNOSIS — N401 Enlarged prostate with lower urinary tract symptoms: Secondary | ICD-10-CM | POA: Diagnosis not present

## 2024-04-26 DIAGNOSIS — R3912 Poor urinary stream: Secondary | ICD-10-CM | POA: Diagnosis not present

## 2024-05-03 DIAGNOSIS — C61 Malignant neoplasm of prostate: Secondary | ICD-10-CM | POA: Diagnosis not present

## 2024-05-03 DIAGNOSIS — J40 Bronchitis, not specified as acute or chronic: Secondary | ICD-10-CM | POA: Diagnosis not present

## 2024-05-03 DIAGNOSIS — E7800 Pure hypercholesterolemia, unspecified: Secondary | ICD-10-CM | POA: Diagnosis not present

## 2024-05-03 DIAGNOSIS — J439 Emphysema, unspecified: Secondary | ICD-10-CM | POA: Diagnosis not present

## 2024-05-05 DIAGNOSIS — J40 Bronchitis, not specified as acute or chronic: Secondary | ICD-10-CM | POA: Diagnosis not present

## 2024-05-05 DIAGNOSIS — J439 Emphysema, unspecified: Secondary | ICD-10-CM | POA: Diagnosis not present

## 2024-06-03 DIAGNOSIS — C61 Malignant neoplasm of prostate: Secondary | ICD-10-CM | POA: Diagnosis not present

## 2024-06-03 DIAGNOSIS — E7800 Pure hypercholesterolemia, unspecified: Secondary | ICD-10-CM | POA: Diagnosis not present

## 2024-06-03 DIAGNOSIS — J439 Emphysema, unspecified: Secondary | ICD-10-CM | POA: Diagnosis not present

## 2024-06-03 DIAGNOSIS — J40 Bronchitis, not specified as acute or chronic: Secondary | ICD-10-CM | POA: Diagnosis not present

## 2024-06-04 DIAGNOSIS — J40 Bronchitis, not specified as acute or chronic: Secondary | ICD-10-CM | POA: Diagnosis not present

## 2024-06-04 DIAGNOSIS — J439 Emphysema, unspecified: Secondary | ICD-10-CM | POA: Diagnosis not present

## 2024-06-08 DIAGNOSIS — F1021 Alcohol dependence, in remission: Secondary | ICD-10-CM | POA: Diagnosis not present

## 2024-06-08 DIAGNOSIS — C61 Malignant neoplasm of prostate: Secondary | ICD-10-CM | POA: Diagnosis not present

## 2024-06-08 DIAGNOSIS — J439 Emphysema, unspecified: Secondary | ICD-10-CM | POA: Diagnosis not present

## 2024-06-08 DIAGNOSIS — D6859 Other primary thrombophilia: Secondary | ICD-10-CM | POA: Diagnosis not present

## 2024-06-08 DIAGNOSIS — D649 Anemia, unspecified: Secondary | ICD-10-CM | POA: Diagnosis not present

## 2024-06-08 DIAGNOSIS — E7800 Pure hypercholesterolemia, unspecified: Secondary | ICD-10-CM | POA: Diagnosis not present

## 2024-06-08 DIAGNOSIS — Z79899 Other long term (current) drug therapy: Secondary | ICD-10-CM | POA: Diagnosis not present

## 2024-07-04 DIAGNOSIS — E7800 Pure hypercholesterolemia, unspecified: Secondary | ICD-10-CM | POA: Diagnosis not present

## 2024-07-04 DIAGNOSIS — J439 Emphysema, unspecified: Secondary | ICD-10-CM | POA: Diagnosis not present

## 2024-07-04 DIAGNOSIS — J40 Bronchitis, not specified as acute or chronic: Secondary | ICD-10-CM | POA: Diagnosis not present

## 2024-07-04 DIAGNOSIS — C61 Malignant neoplasm of prostate: Secondary | ICD-10-CM | POA: Diagnosis not present

## 2024-08-03 DIAGNOSIS — C61 Malignant neoplasm of prostate: Secondary | ICD-10-CM | POA: Diagnosis not present

## 2024-08-03 DIAGNOSIS — J439 Emphysema, unspecified: Secondary | ICD-10-CM | POA: Diagnosis not present

## 2024-08-03 DIAGNOSIS — E7800 Pure hypercholesterolemia, unspecified: Secondary | ICD-10-CM | POA: Diagnosis not present

## 2024-08-03 DIAGNOSIS — J40 Bronchitis, not specified as acute or chronic: Secondary | ICD-10-CM | POA: Diagnosis not present

## 2024-08-17 DIAGNOSIS — Z8546 Personal history of malignant neoplasm of prostate: Secondary | ICD-10-CM | POA: Diagnosis not present

## 2024-08-17 DIAGNOSIS — M1990 Unspecified osteoarthritis, unspecified site: Secondary | ICD-10-CM | POA: Diagnosis not present

## 2024-08-17 DIAGNOSIS — H9193 Unspecified hearing loss, bilateral: Secondary | ICD-10-CM | POA: Diagnosis not present

## 2024-08-17 DIAGNOSIS — N400 Enlarged prostate without lower urinary tract symptoms: Secondary | ICD-10-CM | POA: Diagnosis not present

## 2024-08-17 DIAGNOSIS — K219 Gastro-esophageal reflux disease without esophagitis: Secondary | ICD-10-CM | POA: Diagnosis not present

## 2024-08-17 DIAGNOSIS — F1021 Alcohol dependence, in remission: Secondary | ICD-10-CM | POA: Diagnosis not present

## 2024-08-17 DIAGNOSIS — Z79891 Long term (current) use of opiate analgesic: Secondary | ICD-10-CM | POA: Diagnosis not present

## 2024-08-17 DIAGNOSIS — F419 Anxiety disorder, unspecified: Secondary | ICD-10-CM | POA: Diagnosis not present

## 2024-08-17 DIAGNOSIS — M545 Low back pain, unspecified: Secondary | ICD-10-CM | POA: Diagnosis not present

## 2024-08-17 DIAGNOSIS — J439 Emphysema, unspecified: Secondary | ICD-10-CM | POA: Diagnosis not present

## 2024-08-17 DIAGNOSIS — I4891 Unspecified atrial fibrillation: Secondary | ICD-10-CM | POA: Diagnosis not present

## 2024-08-17 DIAGNOSIS — F325 Major depressive disorder, single episode, in full remission: Secondary | ICD-10-CM | POA: Diagnosis not present

## 2024-08-17 DIAGNOSIS — I10 Essential (primary) hypertension: Secondary | ICD-10-CM | POA: Diagnosis not present

## 2024-08-17 DIAGNOSIS — D6869 Other thrombophilia: Secondary | ICD-10-CM | POA: Diagnosis not present

## 2024-08-17 DIAGNOSIS — E785 Hyperlipidemia, unspecified: Secondary | ICD-10-CM | POA: Diagnosis not present

## 2024-08-17 DIAGNOSIS — K760 Fatty (change of) liver, not elsewhere classified: Secondary | ICD-10-CM | POA: Diagnosis not present

## 2024-09-02 DIAGNOSIS — J40 Bronchitis, not specified as acute or chronic: Secondary | ICD-10-CM | POA: Diagnosis not present

## 2024-09-02 DIAGNOSIS — J439 Emphysema, unspecified: Secondary | ICD-10-CM | POA: Diagnosis not present

## 2024-09-03 DIAGNOSIS — J40 Bronchitis, not specified as acute or chronic: Secondary | ICD-10-CM | POA: Diagnosis not present

## 2024-09-03 DIAGNOSIS — J439 Emphysema, unspecified: Secondary | ICD-10-CM | POA: Diagnosis not present

## 2024-09-03 DIAGNOSIS — C61 Malignant neoplasm of prostate: Secondary | ICD-10-CM | POA: Diagnosis not present

## 2024-09-03 DIAGNOSIS — E7800 Pure hypercholesterolemia, unspecified: Secondary | ICD-10-CM | POA: Diagnosis not present

## 2024-10-02 DIAGNOSIS — J40 Bronchitis, not specified as acute or chronic: Secondary | ICD-10-CM | POA: Diagnosis not present

## 2024-10-02 DIAGNOSIS — J439 Emphysema, unspecified: Secondary | ICD-10-CM | POA: Diagnosis not present

## 2024-10-03 DIAGNOSIS — C61 Malignant neoplasm of prostate: Secondary | ICD-10-CM | POA: Diagnosis not present

## 2024-10-03 DIAGNOSIS — J40 Bronchitis, not specified as acute or chronic: Secondary | ICD-10-CM | POA: Diagnosis not present

## 2024-10-03 DIAGNOSIS — J439 Emphysema, unspecified: Secondary | ICD-10-CM | POA: Diagnosis not present

## 2024-10-03 DIAGNOSIS — E7800 Pure hypercholesterolemia, unspecified: Secondary | ICD-10-CM | POA: Diagnosis not present

## 2024-10-12 DIAGNOSIS — C61 Malignant neoplasm of prostate: Secondary | ICD-10-CM | POA: Diagnosis not present
